# Patient Record
Sex: Male | Born: 1965 | Race: Black or African American | Hispanic: No | Marital: Single | Smoking: Current every day smoker
Health system: Southern US, Community
[De-identification: ages and names within clinical notes are randomized; demographics above are authoritative.]

## PROBLEM LIST (undated history)

## (undated) DIAGNOSIS — L309 Dermatitis, unspecified: Secondary | ICD-10-CM

## (undated) DIAGNOSIS — I10 Essential (primary) hypertension: Secondary | ICD-10-CM

## (undated) DIAGNOSIS — E785 Hyperlipidemia, unspecified: Secondary | ICD-10-CM

## (undated) DIAGNOSIS — E119 Type 2 diabetes mellitus without complications: Secondary | ICD-10-CM

## (undated) HISTORY — DX: Essential (primary) hypertension: I10

## (undated) HISTORY — DX: Hyperlipidemia, unspecified: E78.5

## (undated) HISTORY — DX: Dermatitis, unspecified: L30.9

## (undated) HISTORY — DX: Type 2 diabetes mellitus without complications: E11.9

---

## 1989-05-06 HISTORY — PX: OTHER SURGICAL HISTORY: SHX169

## 2006-07-12 ENCOUNTER — Emergency Department (HOSPITAL_COMMUNITY): Admission: EM | Admit: 2006-07-12 | Discharge: 2006-07-12 | Payer: Self-pay | Admitting: Emergency Medicine

## 2016-05-06 HISTORY — PX: CERVICAL SPINE SURGERY: SHX589

## 2020-09-12 ENCOUNTER — Other Ambulatory Visit (HOSPITAL_COMMUNITY): Payer: Self-pay

## 2020-09-12 ENCOUNTER — Encounter: Payer: Self-pay | Admitting: Internal Medicine

## 2020-09-12 ENCOUNTER — Ambulatory Visit (INDEPENDENT_AMBULATORY_CARE_PROVIDER_SITE_OTHER): Payer: Self-pay | Admitting: Internal Medicine

## 2020-09-12 ENCOUNTER — Other Ambulatory Visit: Payer: Self-pay

## 2020-09-12 VITALS — BP 138/82 | HR 80 | Temp 99.4°F | Ht 67.5 in | Wt 191.7 lb

## 2020-09-12 DIAGNOSIS — E785 Hyperlipidemia, unspecified: Secondary | ICD-10-CM

## 2020-09-12 DIAGNOSIS — L309 Dermatitis, unspecified: Secondary | ICD-10-CM

## 2020-09-12 DIAGNOSIS — E119 Type 2 diabetes mellitus without complications: Secondary | ICD-10-CM

## 2020-09-12 DIAGNOSIS — I10 Essential (primary) hypertension: Secondary | ICD-10-CM

## 2020-09-12 DIAGNOSIS — M545 Low back pain, unspecified: Secondary | ICD-10-CM

## 2020-09-12 DIAGNOSIS — F17200 Nicotine dependence, unspecified, uncomplicated: Secondary | ICD-10-CM

## 2020-09-12 DIAGNOSIS — G8929 Other chronic pain: Secondary | ICD-10-CM

## 2020-09-12 MED ORDER — METFORMIN HCL 850 MG PO TABS
850.0000 mg | ORAL_TABLET | Freq: Two times a day (BID) | ORAL | 11 refills | Status: DC
  Filled 2020-09-12: qty 60, 30d supply, fill #0
  Filled 2020-11-02: qty 60, 30d supply, fill #1

## 2020-09-12 MED ORDER — ATORVASTATIN CALCIUM 20 MG PO TABS
20.0000 mg | ORAL_TABLET | Freq: Every day | ORAL | 11 refills | Status: DC
  Filled 2020-09-12: qty 30, 30d supply, fill #0
  Filled 2020-11-02: qty 30, 30d supply, fill #1

## 2020-09-12 MED ORDER — AMLODIPINE BESYLATE 10 MG PO TABS
10.0000 mg | ORAL_TABLET | Freq: Every day | ORAL | 2 refills | Status: DC
  Filled 2020-09-12: qty 30, 30d supply, fill #0
  Filled 2020-11-02: qty 30, 30d supply, fill #1

## 2020-09-12 NOTE — Patient Instructions (Addendum)
Thank you, Jeffery Wheeler for allowing [place] to provide your care today. Today we discussed the following  1. Hyperlipidemia, unspecified hyperlipidemia type  - atorvastatin (LIPITOR) 20 MG tablet; Take 1 tablet (20 mg total) by mouth daily.  Dispense: 30 tablet; Refill: 11  2. Type 2 diabetes mellitus without complication, without long-term current use of insulin (HCC)  - metFORMIN (GLUCOPHAGE) 850 MG tablet; Take 1 tablet (850 mg total) by mouth 2 (two) times daily with a meal.  Dispense: 60 tablet; Refill: 11  3. Primary hypertension - amlodipine 10 mg  4. Back Pain  Back Exercises These exercises help to make your trunk and back strong. They also help to keep the lower back flexible. Doing these exercises can help to prevent back pain or lessen existing pain.  If you have back pain, try to do these exercises 2-3 times each day or as told by your doctor.  As you get better, do the exercises once each day. Repeat the exercises more often as told by your doctor.  To stop back pain from coming back, do the exercises once each day, or as told by your doctor. Exercises Single knee to chest Do these steps 3-5 times in a row for each leg: 1. Lie on your back on a firm bed or the floor with your legs stretched out. 2. Bring one knee to your chest. 3. Grab your knee or thigh with both hands and hold them it in place. 4. Pull on your knee until you feel a gentle stretch in your lower back or buttocks. 5. Keep doing the stretch for 10-30 seconds. 6. Slowly let go of your leg and straighten it. Pelvic tilt Do these steps 5-10 times in a row: 1. Lie on your back on a firm bed or the floor with your legs stretched out. 2. Bend your knees so they point up to the ceiling. Your feet should be flat on the floor. 3. Tighten your lower belly (abdomen) muscles to press your lower back against the floor. This will make your tailbone point up to the ceiling instead of pointing down to  your feet or the floor. 4. Stay in this position for 5-10 seconds while you gently tighten your muscles and breathe evenly.      Cat-cow Do these steps until your lower back bends more easily: 1. Get on your hands and knees on a firm surface. Keep your hands under your shoulders, and keep your knees under your hips. You may put padding under your knees. 2. Let your head hang down toward your chest. Tighten (contract) the muscles in your belly. Point your tailbone toward the floor so your lower back becomes rounded like the back of a cat. 3. Stay in this position for 5 seconds. 4. Slowly lift your head. Let the muscles of your belly relax. Point your tailbone up toward the ceiling so your back forms a sagging arch like the back of a cow. 5. Stay in this position for 5 seconds.    Press-ups Do these steps 5-10 times in a row: 1. Lie on your belly (face-down) on the floor. 2. Place your hands near your head, about shoulder-width apart. 3. While you keep your back relaxed and keep your hips on the floor, slowly straighten your arms to raise the top half of your body and lift your shoulders. Do not use your back muscles. You may change where you place your hands in order to make  yourself more comfortable. 4. Stay in this position for 5 seconds. 5. Slowly return to lying flat on the floor.    Bridges Do these steps 10 times in a row: 1. Lie on your back on a firm surface. 2. Bend your knees so they point up to the ceiling. Your feet should be flat on the floor. Your arms should be flat at your sides, next to your body. 3. Tighten your butt muscles and lift your butt off the floor until your waist is almost as high as your knees. If you do not feel the muscles working in your butt and the back of your thighs, slide your feet 1-2 inches farther away from your butt. 4. Stay in this position for 3-5 seconds. 5. Slowly lower your butt to the floor, and let your butt muscles relax. If this exercise  is too easy, try doing it with your arms crossed over your chest.    Belly crunches Do these steps 5-10 times in a row: 1. Lie on your back on a firm bed or the floor with your legs stretched out. 2. Bend your knees so they point up to the ceiling. Your feet should be flat on the floor. 3. Cross your arms over your chest. 4. Tip your chin a little bit toward your chest but do not bend your neck. 5. Tighten your belly muscles and slowly raise your chest just enough to lift your shoulder blades a tiny bit off of the floor. Avoid raising your body higher than that, because it can put too much stress on your low back. 6. Slowly lower your chest and your head to the floor. Back lifts Do these steps 5-10 times in a row: 1. Lie on your belly (face-down) with your arms at your sides, and rest your forehead on the floor. 2. Tighten the muscles in your legs and your butt. 3. Slowly lift your chest off of the floor while you keep your hips on the floor. Keep the back of your head in line with the curve in your back. Look at the floor while you do this. 4. Stay in this position for 3-5 seconds. 5. Slowly lower your chest and your face to the floor. Contact a doctor if:  Your back pain gets a lot worse when you do an exercise.  Your back pain does not get better 2 hours after you exercise. If you have any of these problems, stop doing the exercises. Do not do them again unless your doctor says it is okay. Get help right away if:  You have sudden, very bad back pain. If this happens, stop doing the exercises. Do not do them again unless your doctor says it is okay.  5.Eczema Atopic Dermatitis Atopic dermatitis is a skin disorder that causes inflammation of the skin. It is marked by a red rash and itchy, dry, scaly skin. It is the most common type of eczema. Eczema is a group of skin conditions that cause the skin to become rough and swollen. This condition is generally worse during the cooler winter  months and often improves during the warm summer months. Atopic dermatitis usually starts showing signs in infancy and can last through adulthood. This condition cannot be passed from one person to another (is not contagious). Atopic dermatitis may not always be present, but when it is, it is called a flare-up. What are the causes? The exact cause of this condition is not known. Flare-ups may be triggered by:  Coming  in contact with something that you are sensitive or allergic to (allergen).  Stress.  Certain foods.  Extremely hot or cold weather.  Harsh chemicals and soaps.  Dry air.  Chlorine. What increases the risk? This condition is more likely to develop in people who have a personal or family history of:  Eczema.  Allergies.  Asthma.  Hay fever. What are the signs or symptoms? Symptoms of this condition include:  Dry, scaly skin.  Red, itchy rash.  Itchiness, which can be severe. This may occur before the skin rash. This can make sleeping difficult.  Skin thickening and cracking that can occur over time.    How is this diagnosed? This condition is diagnosed based on:  Your symptoms.  Your medical history.  A physical exam. How is this treated? There is no cure for this condition, but symptoms can usually be controlled. Treatment focuses on:  Controlling the itchiness and scratching. You may be given medicines, such as antihistamines or steroid creams.  Limiting exposure to allergens.  Recognizing situations that cause stress and developing a plan to manage stress. If your atopic dermatitis does not get better with medicines, or if it is all over your body (widespread), a treatment using a specific type of light (phototherapy) may be used. Follow these instructions at home: Skin care  Keep your skin well moisturized. Doing this seals in moisture and helps to prevent dryness. ? Use unscented lotions that have petroleum in them. ? Avoid lotions that  contain alcohol or water. They can dry the skin.  Keep baths or showers short (less than 5 minutes) in warm water. Do not use hot water. ? Use mild, unscented cleansers for bathing. Avoid soap and bubble bath. ? Apply a moisturizer to your skin right after a bath or shower.  Do not apply anything to your skin without checking with your health care provider.    General instructions  Take or apply over-the-counter and prescription medicines only as told by your health care provider.  Dress in clothes made of cotton or cotton blends. Dress lightly because heat increases itchiness.  When washing your clothes, rinse your clothes twice so all of the soap is removed.  Avoid any triggers that can cause a flare-up.  Keep your fingernails cut short.  Avoid scratching. Scratching makes the rash and itchiness worse. A break in the skin from scratching could result in a skin infection (impetigo).  Do not be around people who have cold sores or fever blisters. If you get the infection, it may cause your atopic dermatitis to worsen.  Keep all follow-up visits. This is important. Contact a health care provider if:  Your itchiness interferes with sleep.  Your rash gets worse or is not better within one week of starting treatment.  You have a fever.  You have a rash flare-up after having contact with someone who has cold sores or fever blisters. Get help right away if:  You develop pus or soft yellow scabs in the rash area. Summary  Atopic dermatitis causes a red rash and itchy, dry, scaly skin.  Treatment focuses on controlling the itchiness and scratching, limiting exposure to things that you are sensitive or allergic to (allergens), recognizing situations that cause stress, and developing a plan to manage stress.  Keep your skin well moisturized.  Keep baths or showers shorter than 5 minutes and use warm water. Do not use hot water.

## 2020-09-12 NOTE — Progress Notes (Signed)
   CC: Establishing care with clinic. Hyperlipidemia, Type 2 diabetes mellitus, primary hypertension, chronic back pain, and Eczema   HPI:[name] is a 55 y.o. male who presents for evaluation of HLD, T2DM, HTN, Chronic back pain, and Eczema. Please see individual problem based A/P for details.  Family History  Problem Relation Age of Onset  . Diabetes type II Sister   . Diabetes type II Paternal Grandmother   . Diabetes type II Maternal Aunt    No Known Allergies  Past Medical History:  Diagnosis Date  . Eczema   . HLD (hyperlipidemia)   . HTN (hypertension)   . Type 2 diabetes mellitus (HCC)    Patient currently lives at a half way house. He was released from a 15 year prison sentence in March. He is adjusting to his new life fairly well. He has family near. He is not married.  Social History   Tobacco Use  . Smoking status: Current Every Day Smoker    Packs/day: 0.50  Substance Use Topics  . Alcohol use: Not Currently  . Drug use: Not Currently   Family History  Problem Relation Age of Onset  . Diabetes type II Sister   . Diabetes type II Paternal Grandmother   . Diabetes type II Maternal Aunt     No past medical history on file. Review of Systems:   Review of Systems  Constitutional: Negative for chills and fever.  HENT: Negative for hearing loss and sore throat.   Eyes: Negative for pain and redness.  Respiratory: Negative for cough and shortness of breath.   Cardiovascular: Negative for chest pain and leg swelling.  Gastrointestinal: Negative for blood in stool and melena.  Genitourinary: Negative for dysuria and urgency.  Musculoskeletal: Positive for back pain. Negative for falls.  Skin: Positive for itching and rash.  Neurological: Negative for dizziness and headaches.  Endo/Heme/Allergies: Negative for environmental allergies and polydipsia.  Psychiatric/Behavioral: Negative for depression and substance abuse.       Physical  Exam: Vitals:   09/12/20 1413  BP: 138/82  Pulse: 80  Temp: 99.4 F (37.4 C)  TempSrc: Oral  SpO2: 99%  Weight: 191 lb 11.2 oz (87 kg)  Height: 5' 7.5" (1.715 m)   General: middle age man with shaved head, well kept, NAD HE: [place]/AT , Conjunctiva nl , antiicteric sclerae,  ENT: moist mucous membranes, no exudate or erythema Cardiovascular: Normal rate, regular rhythm.  No murmurs, rubs, or gallops Pulmonary : Equal breath sounds, No wheezes, rales, or rhonchi Abdominal: soft, nontender,  bowel sounds present Skin: Rash on hands , feet and left lower leg. Red/brown pruritic patchy dry skin   Ext: No edema in lower extremities, no tenderness to palpation of lower extremities.  Psych: nl mood , nl affect   Assessment & Plan:   See Encounters Tab for problem based charting.  Patient discussed with Dr. Antony Contras

## 2020-09-13 ENCOUNTER — Encounter: Payer: Self-pay | Admitting: Internal Medicine

## 2020-09-13 DIAGNOSIS — M549 Dorsalgia, unspecified: Secondary | ICD-10-CM | POA: Insufficient documentation

## 2020-09-13 DIAGNOSIS — L309 Dermatitis, unspecified: Secondary | ICD-10-CM | POA: Insufficient documentation

## 2020-09-13 DIAGNOSIS — E785 Hyperlipidemia, unspecified: Secondary | ICD-10-CM | POA: Insufficient documentation

## 2020-09-13 DIAGNOSIS — I10 Essential (primary) hypertension: Secondary | ICD-10-CM | POA: Insufficient documentation

## 2020-09-13 DIAGNOSIS — E119 Type 2 diabetes mellitus without complications: Secondary | ICD-10-CM | POA: Insufficient documentation

## 2020-09-13 DIAGNOSIS — F17200 Nicotine dependence, unspecified, uncomplicated: Secondary | ICD-10-CM | POA: Insufficient documentation

## 2020-09-13 NOTE — Assessment & Plan Note (Addendum)
BP: 138/82   HPI: Patient has been out of all medications for 10 days. His presents medication he was on which include lisinopril 20 mg, HCTZ 25 mg, and Coreg 12.5 BID. Given his BP is only mildly elevated we discussed starting back only one medication. His resources are limited and he is applying for financial assistance.   Assessment: Hypertension is uncontrolled today off of all medications. Plan: - Starting Amlodipine 10 mg which does not require lab monitoring. Patient will need baseline labs at next visit.  - amLODipine (NORVASC) 10 MG tablet; Take 1 tablet (10 mg total) by mouth daily.  Dispense: 30 tablet; Refill: 2

## 2020-09-13 NOTE — Assessment & Plan Note (Addendum)
HPI: Reports a history of lower back pain. Also has a history of cervical spine surgery. 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  Assessment/Plan: By description patient has history of lumbar strain. - Patient given exercises and stretches to perform for prevention.

## 2020-09-13 NOTE — Assessment & Plan Note (Signed)
Hx of HLD. Patient has been on lovastatin 40 mg. Will switch to Lipitor as it is on IM program formulary.   Assessment: Hyperlipidemia, unspecified hyperlipidemia type Plan: Continue moderate intensity statin, patient will need lab work at next visit. He is applying for financial assistance to afford cost of labs. Will defer today and continue moderated intensity statin. He statin intolerance in the past.  - atorvastatin (LIPITOR) 20 MG tablet; Take 1 tablet (20 mg total) by mouth daily.  Dispense: 30 tablet; Refill: 11

## 2020-09-13 NOTE — Assessment & Plan Note (Signed)
  Patient has been treated for Eczema in the past .Reports being given a cream which seem to help, but unaware of the specific cream. Has Eczema involving hands , left leg, forearm,  and some place on his feet.  Assessment: Eczema Plan: - Given instructions on skin care. Recommended over the counter hydrocortisone cream if skin care alone does not help manage.

## 2020-09-13 NOTE — Assessment & Plan Note (Signed)
Patient recently started smoking again . He reports smoking when he was younger and he quit when incarcerated. He has not smoked tobacco in the last 15 years. Not interested in quitting at this time.

## 2020-09-13 NOTE — Assessment & Plan Note (Signed)
He reports he was told diabetes is controlled on Metformin only, but does not know his recent Hemoglobin A1c. Will defer lab work today, but educated patient on diabetes and complications from uncontrolled diabetes.   Assessment : Type 2 diabetes mellitus without complication, without long-term current use of insulin (HCC). He is aware I can not make further assessment without lab work. We talked about monitoring at future visits with Hemoglobin A1c once financial assistance is approved.  Plan: - metFORMIN (GLUCOPHAGE) 850 MG tablet; Take 1 tablet (850 mg total) by mouth 2 (two) times daily with a meal.  Dispense: 60 tablet; Refill: 11 - Lab work at next visit

## 2020-09-15 NOTE — Progress Notes (Signed)
[facility] Medicine Clinic Attending  Case discussed with Dr. Steen  At the time of the visit.  We reviewed the resident's history and exam and pertinent patient test results.  I agree with the assessment, diagnosis, and plan of care documented in the resident's note.  

## 2020-09-26 ENCOUNTER — Ambulatory Visit: Payer: Self-pay

## 2020-09-26 ENCOUNTER — Other Ambulatory Visit: Payer: Self-pay

## 2020-11-02 ENCOUNTER — Other Ambulatory Visit (HOSPITAL_COMMUNITY): Payer: Self-pay

## 2020-11-20 ENCOUNTER — Other Ambulatory Visit (HOSPITAL_COMMUNITY): Payer: Self-pay

## 2020-11-20 ENCOUNTER — Ambulatory Visit: Payer: Self-pay | Admitting: Physician Assistant

## 2020-11-20 ENCOUNTER — Other Ambulatory Visit: Payer: Self-pay

## 2020-11-20 VITALS — BP 145/82 | HR 78 | Temp 98.6°F | Resp 18 | Ht 69.0 in | Wt 190.0 lb

## 2020-11-20 DIAGNOSIS — Z1159 Encounter for screening for other viral diseases: Secondary | ICD-10-CM

## 2020-11-20 DIAGNOSIS — F5104 Psychophysiologic insomnia: Secondary | ICD-10-CM

## 2020-11-20 DIAGNOSIS — I10 Essential (primary) hypertension: Secondary | ICD-10-CM

## 2020-11-20 DIAGNOSIS — Z125 Encounter for screening for malignant neoplasm of prostate: Secondary | ICD-10-CM

## 2020-11-20 DIAGNOSIS — R351 Nocturia: Secondary | ICD-10-CM | POA: Insufficient documentation

## 2020-11-20 DIAGNOSIS — E785 Hyperlipidemia, unspecified: Secondary | ICD-10-CM

## 2020-11-20 DIAGNOSIS — R350 Frequency of micturition: Secondary | ICD-10-CM

## 2020-11-20 DIAGNOSIS — Z114 Encounter for screening for human immunodeficiency virus [HIV]: Secondary | ICD-10-CM

## 2020-11-20 DIAGNOSIS — E119 Type 2 diabetes mellitus without complications: Secondary | ICD-10-CM

## 2020-11-20 DIAGNOSIS — F1010 Alcohol abuse, uncomplicated: Secondary | ICD-10-CM

## 2020-11-20 DIAGNOSIS — F17210 Nicotine dependence, cigarettes, uncomplicated: Secondary | ICD-10-CM

## 2020-11-20 DIAGNOSIS — F411 Generalized anxiety disorder: Secondary | ICD-10-CM | POA: Insufficient documentation

## 2020-11-20 DIAGNOSIS — F1910 Other psychoactive substance abuse, uncomplicated: Secondary | ICD-10-CM

## 2020-11-20 DIAGNOSIS — R339 Retention of urine, unspecified: Secondary | ICD-10-CM | POA: Insufficient documentation

## 2020-11-20 DIAGNOSIS — F17200 Nicotine dependence, unspecified, uncomplicated: Secondary | ICD-10-CM

## 2020-11-20 LAB — POCT URINALYSIS DIP (CLINITEK)
Bilirubin, UA: NEGATIVE
Blood, UA: NEGATIVE
Glucose, UA: >=1000 mg/dL — AB
Ketones, POC UA: NEGATIVE mg/dL
Leukocytes, UA: NEGATIVE
Nitrite, UA: NEGATIVE
POC PROTEIN,UA: NEGATIVE
Spec Grav, UA: 1.015 (ref 1.010–1.025)
Urobilinogen, UA: 1 E.U./dL
pH, UA: 6 (ref 5.0–8.0)

## 2020-11-20 LAB — POCT GLYCOSYLATED HEMOGLOBIN (HGB A1C): Hemoglobin A1C: 6.5 % — AB (ref 4.0–5.6)

## 2020-11-20 LAB — GLUCOSE, POCT (MANUAL RESULT ENTRY): POC Glucose: 156 mg/dl — AB (ref 70–99)

## 2020-11-20 MED ORDER — ATORVASTATIN CALCIUM 20 MG PO TABS: 20.0000 mg | ORAL_TABLET | Freq: Every day | ORAL | 1 refills | Status: DC

## 2020-11-20 MED ORDER — TRAZODONE HCL 50 MG PO TABS: 25.0000 mg | ORAL_TABLET | Freq: Every evening | ORAL | 3 refills | Status: DC | PRN

## 2020-11-20 MED ORDER — METFORMIN HCL 850 MG PO TABS: 850.0000 mg | ORAL_TABLET | Freq: Two times a day (BID) | ORAL | 1 refills | Status: DC

## 2020-11-20 MED ORDER — LISINOPRIL 5 MG PO TABS: 5.0000 mg | ORAL_TABLET | Freq: Every day | ORAL | 1 refills | Status: DC

## 2020-11-20 MED ORDER — AMLODIPINE BESYLATE 10 MG PO TABS: 10.0000 mg | ORAL_TABLET | Freq: Every day | ORAL | 1 refills | Status: DC

## 2020-11-20 MED ORDER — HYDROXYZINE HCL 10 MG PO TABS
10.0000 mg | ORAL_TABLET | Freq: Three times a day (TID) | ORAL | 0 refills | Status: DC | PRN
Start: 2020-11-20 — End: 2021-12-17

## 2020-11-20 NOTE — Progress Notes (Signed)
Patient has eaten and taken medication today. Patient denies pain at this time. Patient reports increase in urination since the 1st of July. Patient A1C: 6.5 CBG: 156 UA: NEG for Infection

## 2020-11-20 NOTE — Progress Notes (Signed)
New Patient Office Visit  Subjective:  Patient ID: Jeffery Wheeler, male    DOB: [birth year]  Age: 55 y.o. MRN: [id number]  CC:  Chief Complaint  Patient presents with   Hypertension   Diabetes     HPI Mikie Misner orts that he has been having increased urination since the beginning of July.  Reports that he is getting up in the middle the night to use the bathroom up to 5 times at night.  Reports that he is having some feelings of incomplete emptying.  Denies dysuria.  States that he is currently being treated for substance abuse at [facility] residential treatment center, states that he plans on staying there until July 29 and is unsure of his plans after that.  Reports that he was previously incarcerated for 15 years.  He states that during that time his blood glucose levels were well controlled.  Reports that he continues to take his metformin twice a day, has been having blood glucose levels checked twice a day with readings within normal limits in the morning.  Reports that he has been having elevated blood pressure readings, has been taking amlodipine 10 mg.  Reports blood pressure readings as similar to today's reading.  Reports that he has been having difficulty sleeping, has been using melatonin and Benadryl without relief.  Reports he has a hard time falling asleep, and going back to sleep after having to get up to urinate.  Endorses racing thoughts  States that he has been having elevated anxiety, states that he will feel "things crawling on his skin".  Denies previously being treated for anxiety.  Denies anxiety attacks.  Reports that he had irritation in his left ear approximately 1 week ago, states that he felt a scab, states that he put Neosporin on it with resolution.       Past Medical History:  Diagnosis Date   Eczema    HLD (hyperlipidemia)    HTN (hypertension)    Type 2 diabetes mellitus (HCC)     Past Surgical History:  Procedure Laterality Date    CERVICAL SPINE SURGERY  2018   right shoulder  1991    Family History  Problem Relation Age of Onset   Diabetes type II Sister    Diabetes type II Paternal Grandmother    Diabetes type II Maternal Aunt     Social History   Socioeconomic History   Marital status: Single    Spouse name: Not on file   Number of children: Not on file   Years of education: Not on file   Highest education level: Not on file  Occupational History   Not on file  Tobacco Use   Smoking status: Every Day    Packs/day: 0.50    Types: Cigarettes   Smokeless tobacco: Never  Substance and Sexual Activity   Alcohol use: Not Currently   Drug use: Not Currently   Sexual activity: Yes  Other Topics Concern   Not on file  Social History Narrative   Not on file   Social Determinants of Health   Financial Resource Strain: Not on file  Food Insecurity: Not on file  Transportation Needs: Not on file  Physical Activity: Not on file  Stress: Not on file  Social Connections: Not on file  Intimate Partner Violence: Not on file    ROS Review of Systems  Constitutional: Negative.   HENT:  Negative for ear discharge and ear pain.   Eyes: Negative.  Respiratory:  Negative for shortness of breath.   Cardiovascular:  Negative for chest pain.  Gastrointestinal: Negative.   Endocrine: Negative.   Genitourinary:  Positive for frequency. Negative for dysuria, hematuria and penile discharge.  Musculoskeletal: Negative.   Skin: Negative.   Allergic/Immunologic: Negative.   Neurological: Negative.   Hematological: Negative.   Psychiatric/Behavioral:  Positive for sleep disturbance. Negative for dysphoric mood, self-injury and suicidal ideas. The patient is nervous/anxious.    Objective:   Today's Vitals: BP (!) 145/82 (BP Location: Left Arm, Patient Position: Sitting, Cuff Size: Normal)   Pulse 78   Temp 98.6 F (37 C) (Oral)   Resp 18   Ht 5\' 9"  (1.753 m)   Wt 190 lb (86.2 kg)   SpO2 99%   BMI 28.06  kg/m   Physical Exam Vitals and nursing note reviewed.  Constitutional:      Appearance: Normal appearance.  HENT:     Head: Normocephalic and atraumatic.     Right Ear: Tympanic membrane, ear canal and external ear normal.     Left Ear: Tympanic membrane, ear canal and external ear normal.     Nose: Nose normal.     Mouth/Throat:     Mouth: Mucous membranes are moist.     Pharynx: Oropharynx is clear.  Eyes:     Extraocular Movements: Extraocular movements intact.     Conjunctiva/sclera: Conjunctivae normal.     Pupils: Pupils are equal, round, and reactive to light.  Cardiovascular:     Rate and Rhythm: Normal rate and regular rhythm.     Pulses: Normal pulses.     Heart sounds: Normal heart sounds.  Pulmonary:     Effort: Pulmonary effort is normal.     Breath sounds: Normal breath sounds.  Musculoskeletal:        General: Normal range of motion.     Cervical back: Normal range of motion and neck supple.  Skin:    General: Skin is warm and dry.  Neurological:     General: No focal deficit present.     Mental Status: He is alert. Mental status is at baseline. He is disoriented.  Psychiatric:        Mood and Affect: Mood normal.        Behavior: Behavior normal.        Thought Content: Thought content normal.        Judgment: Judgment normal.    Assessment & Plan:   Problem List Items Addressed This Visit       Cardiovascular and Mediastinum   HTN (hypertension)   Relevant Medications   amLODipine (NORVASC) 10 MG tablet   atorvastatin (LIPITOR) 20 MG tablet   lisinopril (ZESTRIL) 5 MG tablet     Endocrine   Type 2 diabetes mellitus (HCC) - Primary   Relevant Medications   atorvastatin (LIPITOR) 20 MG tablet   metFORMIN (GLUCOPHAGE) 850 MG tablet   lisinopril (ZESTRIL) 5 MG tablet   Other Relevant Orders   CBC with Differential/Platelet (Completed)   Comp. Metabolic Panel (12) (Completed)   TSH (Completed)   Microalbumin / creatinine urine ratio    Vitamin D, 25-hydroxy (Completed)   HgB A1c (Completed)   Glucose (CBG) (Completed)     Other   HLD (hyperlipidemia)   Relevant Medications   amLODipine (NORVASC) 10 MG tablet   atorvastatin (LIPITOR) 20 MG tablet   lisinopril (ZESTRIL) 5 MG tablet   Other Relevant Orders   Lipid panel   Tobacco use disorder  Increased urinary frequency   Relevant Orders   POCT URINALYSIS DIP (CLINITEK) (Completed)   Nocturia more than twice per night   Incomplete emptying of bladder   Psychophysiological insomnia   Generalized anxiety disorder   Relevant Medications   traZODone (DESYREL) 50 MG tablet   hydrOXYzine (ATARAX/VISTARIL) 10 MG tablet   Alcohol abuse   Substance abuse (HCC)   Other Visit Diagnoses     Screening PSA (prostate specific antigen)       Relevant Orders   PSA, total and free (Completed)   Screening for HIV (human immunodeficiency virus)       Relevant Orders   HIV antibody (with reflex) (Completed)   Encounter for HCV screening test for low risk patient       Relevant Orders   HCV Ab w Reflex to Quant PCR (Completed)       Outpatient Encounter Medications as of 11/20/2020  Medication Sig   hydrOXYzine (ATARAX/VISTARIL) 10 MG tablet Take 1 tablet (10 mg total) by mouth 3 (three) times daily as needed for anxiety.   lisinopril (ZESTRIL) 5 MG tablet Take 1 tablet (5 mg total) by mouth daily.   traZODone (DESYREL) 50 MG tablet Take 0.5-1 tablets (25-50 mg total) by mouth at bedtime as needed for sleep.   [DISCONTINUED] amLODipine (NORVASC) 10 MG tablet Take 1 tablet (10 mg total) by mouth daily.   [DISCONTINUED] atorvastatin (LIPITOR) 20 MG tablet Take 1 tablet (20 mg total) by mouth daily.   [DISCONTINUED] metFORMIN (GLUCOPHAGE) 850 MG tablet Take 1 tablet (850 mg total) by mouth 2 (two) times daily with a meal.   amLODipine (NORVASC) 10 MG tablet Take 1 tablet (10 mg total) by mouth daily.   atorvastatin (LIPITOR) 20 MG tablet Take 1 tablet (20 mg total) by mouth  daily.   metFORMIN (GLUCOPHAGE) 850 MG tablet Take 1 tablet (850 mg total) by mouth 2 (two) times daily with a meal.   No facility-administered encounter medications on file as of 11/20/2020.   1. Type 2 diabetes mellitus without complication, without long-term current use of insulin (HCC) A1c 6.5.  Encourage patient to continue checking blood glucose levels at home, keep a written log and have available for all office visits.  Continue current regimen.  Patient education given on low sugar diet.   - Microalbumin / creatinine urine ratio - HgB A1c - Glucose (CBG) - metFORMIN (GLUCOPHAGE) 850 MG tablet; Take 1 tablet (850 mg total) by mouth 2 (two) times daily with a meal.  Dispense: 60 tablet; Refill: 1 - Vitamin D, 25-hydroxy - TSH - Comp. Metabolic Panel (12) - CBC with Differential/Platelet  2. Primary hypertension Continue amlodipine, trial lisinopril 5 mg.  Patient encouraged to check blood pressure on a daily basis, keep a written log and have available for all office visits.  Patient to return to mobile unit in 2 weeks for follow-up and complete lipid panel.  Patient encouraged to follow a low-sodium diet. - amLODipine (NORVASC) 10 MG tablet; Take 1 tablet (10 mg total) by mouth daily.  Dispense: 30 tablet; Refill: 1 - lisinopril (ZESTRIL) 5 MG tablet; Take 1 tablet (5 mg total) by mouth daily.  Dispense: 30 tablet; Refill: 1  3. Hyperlipidemia, unspecified hyperlipidemia type Continue current regimen - Lipid panel; Future - atorvastatin (LIPITOR) 20 MG tablet; Take 1 tablet (20 mg total) by mouth daily.  Dispense: 30 tablet; Refill: 1  4. Increased urinary frequency Negative for urinary tract infection, encouraged patient to limit evening liquids -  POCT URINALYSIS DIP (CLINITEK)  5. Incomplete emptying of bladder   6. Nocturia more than twice per night   7. Psychophysiological insomnia   8. Generalized anxiety disorder Trial trazodone for insomnia, hydroxyzine for  anxiety.  Patient education given on good sleep hygiene - traZODone (DESYREL) 50 MG tablet; Take 0.5-1 tablets (25-50 mg total) by mouth at bedtime as needed for sleep.  Dispense: 30 tablet; Refill: 3 - hydrOXYzine (ATARAX/VISTARIL) 10 MG tablet; Take 1 tablet (10 mg total) by mouth 3 (three) times daily as needed for anxiety.  Dispense: 60 tablet; Refill: 0  9. Alcohol abuse Patient is currently in residential treatment program at [facility]  10. Substance abuse (HCC)   11. Tobacco use disorder   12. Screening PSA (prostate specific antigen)  -  - PSA, total and free  13. Screening for HIV (human immunodeficiency virus)  - - HIV antibody (with reflex)  14. Encounter for HCV screening test for low risk patient   - HCV Ab w Reflex to Quant PCR   I have reviewed the patient's medical history (PMH, PSH, Social History, Family History, Medications, and allergies) , and have been updated if relevant. I spent 32 minutes reviewing chart and  face to face time with patient.    Follow-up: Return in about 2 weeks (around 12/04/2020) for Fasting  labs.   Kasandra Knudsen Mayers, PA-C

## 2020-11-20 NOTE — Patient Instructions (Signed)
For your diabetes, you will continue taking metformin twice a day, you can reduce your blood glucose checks to 2 times a week.  Your A1c was 6.5, your diabetes is very well controlled.  I do encourage you to limit your sugar intake.  For your elevated blood pressure, you are running continue amlodipine 10 mg, we are going to add lisinopril 5 mg once a day to try and get better control.  I encourage you to have your blood pressure checked on a daily basis, keep a written log and have available for all office visits to I also encourage a low-sodium diet.  For your elevated cholesterol, you will continue atorvastatin 20 mg  For your insomnia, you were going to start trazodone, you can use 1/2-1 full tablet at bedtime as needed.  To help you with your anxiety, you can use hydroxyzine 10 mg every 8 hours as needed.  We will call you with today's lab results.  Please return to the mobile unit in 2 weeks, you will complete your fasting lab at that time as well as a follow-up on your blood pressure and sleep.  Roney Jaffe, PA-C Physician Assistant [facility] [email or web address]   Overactive Bladder, Adult  Overactive bladder is a condition in which a person has a sudden and frequent need to urinate. A person might also leak urine if he or she cannot get to the bathroom fast enough (urinary incontinence). Sometimes, symptoms can interfere with work or social activities. What are the causes? Overactive bladder is associated with poor nerve signals between your bladder and your brain. Your bladder may get the signal to empty before it is full. You may also have very sensitive muscles that make your bladder squeeze too soon. This condition may also be caused by other factors, such as: Medical conditions: Urinary tract infection. Infection of nearby tissues. Prostate enlargement. Bladder stones, inflammation, or tumors. Diabetes. Muscle or  nerve weakness, especially from these conditions: A spinal cord injury. Stroke. Multiple sclerosis. Parkinson's disease. Other causes: Surgery on the uterus or urethra. Drinking too much caffeine or alcohol. Certain medicines, especially those that eliminate extra fluid in the body (diuretics). Constipation. What increases the risk? You may be at greater risk for overactive bladder if you: Are an older adult. Smoke. Are going through menopause. Have prostate problems. Have a neurological disease, such as stroke, dementia, Parkinson's disease, or multiple sclerosis (MS). Eat or drink alcohol, spicy food, caffeine, and other things that irritate the bladder. Are overweight or obese. What are the signs or symptoms? Symptoms of this condition include a sudden, strong urge to urinate. Other symptoms include: Leaking urine. Urinating 8 or more times a day. Waking up to urinate 2 or more times overnight. How is this diagnosed? This condition may be diagnosed based on: Your symptoms and medical history. A physical exam. Blood or urine tests to check for possible causes, such as infection. You may also need to see a health care provider who specializes in urinarytract problems. This is called a urologist. How is this treated? Treatment for overactive bladder depends on the cause of your condition and whether it is mild or severe. Treatment may include: Bladder training, such as: Learning to control the urge to urinate by following a schedule to urinate at regular intervals. Doing Kegel exercises to strengthen the pelvic floor muscles that support your bladder. Special devices, such as: Biofeedback. This uses sensors to help you become aware of your body's signals. Electrical stimulation. This  uses electrodes placed inside the body (implanted) or outside the body. These electrodes send gentle pulses of electricity to strengthen the nerves or muscles that control the bladder. Women may use  a plastic device, called a pessary, that fits into the vagina and supports the bladder. Medicines, such as: Antibiotics to treat bladder infection. Antispasmodics to stop the bladder from releasing urine at the wrong time. Tricyclic antidepressants to relax bladder muscles. Injections of botulinum toxin type A directly into the bladder tissue to relax bladder muscles. Surgery, such as: A device may be implanted to help manage the nerve signals that control urination. An electrode may be implanted to stimulate electrical signals in the bladder. A procedure may be done to change the shape of the bladder. This is done only in very severe cases. Follow these instructions at home: Eating and drinking  Make diet or lifestyle changes recommended by your health care provider. These may include: Drinking fluids throughout the day and not only with meals. Cutting down on caffeine or alcohol. Eating a healthy and balanced diet to prevent constipation. This may include: Choosing foods that are high in fiber, such as beans, whole grains, and fresh fruits and vegetables. Limiting foods that are high in fat and processed sugars, such as fried and sweet foods.  Lifestyle  Lose weight if needed. Do not use any products that contain nicotine or tobacco. These include cigarettes, chewing tobacco, and vaping devices, such as e-cigarettes. If you need help quitting, ask your health care provider.  General instructions Take over-the-counter and prescription medicines only as told by your health care provider. If you were prescribed an antibiotic medicine, take it as told by your health care provider. Do not stop taking the antibiotic even if you start to feel better. Use any implants or pessary as told by your health care provider. If needed, wear pads to absorb urine leakage. Keep a log to track how much and when you drink, and when you need to urinate. This will help your health care provider monitor your  condition. Keep all follow-up visits. This is important. Contact a health care provider if: You have a fever or chills. Your symptoms do not get better with treatment. Your pain and discomfort get worse. You have more frequent urges to urinate. Get help right away if: You are not able to control your bladder. Summary Overactive bladder refers to a condition in which a person has a sudden and frequent need to urinate. Several conditions may lead to an overactive bladder. Treatment for overactive bladder depends on the cause and severity of your condition. Making lifestyle changes, doing Kegel exercises, keeping a log, and taking medicines can help with this condition. This information is not intended to replace advice given to you by your health care provider. Make sure you discuss any questions you have with your healthcare provider. Document Revised: 01/10/2020 Document Reviewed: 01/10/2020 Elsevier Patient Education  2022 [employer].

## 2020-11-21 LAB — CBC WITH DIFFERENTIAL/PLATELET
Basophils Absolute: 0.1 10*3/uL (ref 0.0–0.2)
Basos: 1 %
EOS (ABSOLUTE): 0.8 10*3/uL — ABNORMAL HIGH (ref 0.0–0.4)
Eos: 5 %
Hematocrit: 44.7 % (ref 37.5–51.0)
Hemoglobin: 14.5 g/dL (ref 13.0–17.7)
Immature Grans (Abs): 0 10*3/uL (ref 0.0–0.1)
Immature Granulocytes: 0 %
Lymphocytes Absolute: 2.2 10*3/uL (ref 0.7–3.1)
Lymphs: 14 %
MCH: 28.3 pg (ref 26.6–33.0)
MCHC: 32.4 g/dL (ref 31.5–35.7)
MCV: 87 fL (ref 79–97)
Monocytes Absolute: 1.1 10*3/uL — ABNORMAL HIGH (ref 0.1–0.9)
Monocytes: 7 %
Neutrophils Absolute: 11.5 10*3/uL — ABNORMAL HIGH (ref 1.4–7.0)
Neutrophils: 73 %
Platelets: 304 10*3/uL (ref 150–450)
RBC: 5.13 x10E6/uL (ref 4.14–5.80)
RDW: 14.6 % (ref 11.6–15.4)
WBC: 15.8 10*3/uL — ABNORMAL HIGH (ref 3.4–10.8)

## 2020-11-21 LAB — HCV AB W REFLEX TO QUANT PCR: HCV Ab: 0.4 s/co ratio (ref 0.0–0.9)

## 2020-11-21 LAB — VITAMIN D 25 HYDROXY (VIT D DEFICIENCY, FRACTURES): Vit D, 25-Hydroxy: 23.7 ng/mL — ABNORMAL LOW (ref 30.0–100.0)

## 2020-11-21 LAB — TSH: TSH: 1.53 u[IU]/mL (ref 0.450–4.500)

## 2020-11-21 LAB — MICROALBUMIN / CREATININE URINE RATIO
Creatinine, Urine: 98.6 mg/dL
Microalb/Creat Ratio: 4 mg/g creat (ref 0–29)
Microalbumin, Urine: 3.5 ug/mL

## 2020-11-21 LAB — COMP. METABOLIC PANEL (12)
AST: 11 IU/L (ref 0–40)
Albumin/Globulin Ratio: 1.4 (ref 1.2–2.2)
Albumin: 4.2 g/dL (ref 3.8–4.9)
Alkaline Phosphatase: 94 IU/L (ref 44–121)
BUN/Creatinine Ratio: 13 (ref 9–20)
BUN: 12 mg/dL (ref 6–24)
Bilirubin Total: 0.3 mg/dL (ref 0.0–1.2)
Calcium: 9.5 mg/dL (ref 8.7–10.2)
Chloride: 102 mmol/L (ref 96–106)
Creatinine, Ser: 0.93 mg/dL (ref 0.76–1.27)
Globulin, Total: 2.9 g/dL (ref 1.5–4.5)
Glucose: 115 mg/dL — ABNORMAL HIGH (ref 65–99)
Potassium: 4.3 mmol/L (ref 3.5–5.2)
Sodium: 138 mmol/L (ref 134–144)
Total Protein: 7.1 g/dL (ref 6.0–8.5)
eGFR: 96 mL/min/{1.73_m2} (ref >59)

## 2020-11-21 LAB — HCV INTERPRETATION

## 2020-11-21 LAB — PSA, TOTAL AND FREE
PSA, Free Pct: 50 %
PSA, Free: 0.25 ng/mL
Prostate Specific Ag, Serum: 0.5 ng/mL (ref 0.0–4.0)

## 2020-11-21 LAB — HIV ANTIBODY (ROUTINE TESTING W REFLEX): HIV Screen 4th Generation wRfx: NONREACTIVE

## 2020-11-28 NOTE — Progress Notes (Signed)
MA spoke with Jamesetta So the RN at [facility] and relayed patients results. Copy of Vitamin D OTC and FU for labs in 2 weeks was printed and faxed to facility.

## 2020-12-05 ENCOUNTER — Ambulatory Visit (INDEPENDENT_AMBULATORY_CARE_PROVIDER_SITE_OTHER): Payer: Self-pay | Admitting: Physician Assistant

## 2020-12-05 ENCOUNTER — Encounter: Payer: Self-pay | Admitting: Physician Assistant

## 2020-12-05 ENCOUNTER — Other Ambulatory Visit: Payer: Self-pay

## 2020-12-05 VITALS — BP 134/82 | HR 70 | Temp 98.4°F | Resp 18 | Ht 67.0 in | Wt 196.0 lb

## 2020-12-05 DIAGNOSIS — F1010 Alcohol abuse, uncomplicated: Secondary | ICD-10-CM

## 2020-12-05 DIAGNOSIS — F5104 Psychophysiologic insomnia: Secondary | ICD-10-CM

## 2020-12-05 DIAGNOSIS — E785 Hyperlipidemia, unspecified: Secondary | ICD-10-CM

## 2020-12-05 DIAGNOSIS — I10 Essential (primary) hypertension: Secondary | ICD-10-CM

## 2020-12-05 DIAGNOSIS — F411 Generalized anxiety disorder: Secondary | ICD-10-CM

## 2020-12-05 DIAGNOSIS — F1910 Other psychoactive substance abuse, uncomplicated: Secondary | ICD-10-CM

## 2020-12-05 MED ORDER — TRAZODONE HCL 100 MG PO TABS: 100.0000 mg | ORAL_TABLET | Freq: Every evening | ORAL | 1 refills | Status: DC | PRN

## 2020-12-05 MED ORDER — LISINOPRIL 10 MG PO TABS: 10.0000 mg | ORAL_TABLET | Freq: Every day | ORAL | 1 refills | Status: DC

## 2020-12-05 MED ORDER — FLUOXETINE HCL 20 MG PO TABS: 20.0000 mg | ORAL_TABLET | Freq: Every day | ORAL | 3 refills | Status: DC

## 2020-12-05 NOTE — Patient Instructions (Signed)
For your blood pressure, you are going to take an increased dose of lisinopril, 10 mg once daily in addition to the amlodipine.  Continue checking your blood pressure on a daily basis, keeping a written log and have available for all office visits.  Continue low-sodium diet.  To help with your depression and anxiety, you will start Prozac 20 mg once daily.  You can continue using the hydroxyzine 10 mg every 8 hours as needed to help with anxiety, I do not believe this is causing you to have mood swings.  To help with your insomnia, we are going to increase the trazodone to 100 mg at bedtime.  We will call you with today's lab results.  Kennieth Rad, PA-C Physician Assistant [place] [email or web address]   Managing Stress, Adult Feeling a certain amount of stress is normal. Stress helps our body and mind get ready to deal with the demands of life. Stress hormones can motivate you to do well at work and meet your responsibilities. However severe or long-lasting (chronic) stress can affect your mental and physical health. Chronic stress puts you at higher risk for anxiety, depression, and other health problems like digestiveproblems, muscle aches, heart disease, high blood pressure, and stroke. What are the causes? Common causes of stress include: Demands from work, such as deadlines, feeling overworked, or having long hours. Pressures at home, such as money issues, disagreements with a spouse, or parenting issues. Pressures from major life changes, such as divorce, moving, loss of a loved one, or chronic illness. You may be at higher risk for stress-related problems if you do not get enough sleep, are in poor health, do not have emotional support, or have a mentalhealth disorder like anxiety or depression. How to recognize stress Stress can make you: Have trouble sleeping. Feel sad, anxious, irritable, or overwhelmed. Lose your  appetite. Overeat or want to eat unhealthy foods. Want to use drugs or alcohol. Stress can also cause physical symptoms, such as: Sore, tense muscles, especially in the shoulders and neck. Headaches. Trouble breathing. A faster heart rate. Stomach pain, nausea, or vomiting. Diarrhea or constipation. Trouble concentrating. Follow these instructions at home: Lifestyle Identify the source of your stress and your reaction to it. See a therapist who can help you change your reactions. When there are stressful events: Talk about it with family, friends, or co-workers. Try to think realistically about stressful events and not ignore them or overreact. Try to find the positives in a stressful situation and not focus on the negatives. Cut back on responsibilities at work and home, if possible. Ask for help from friends or family members if you need it. Find ways to cope with stress, such as: Meditation. Deep breathing. Yoga or tai chi. Progressive muscle relaxation. Doing art, playing music, or reading. Making time for fun activities. Spending time with family and friends. Get support from family, friends, or spiritual resources. Eating and drinking Eat a healthy diet. This includes: Eating foods that are high in fiber, such as beans, whole grains, and fresh fruits and vegetables. Limiting foods that are high in fat and processed sugars, such as fried and sweet foods. Do not skip meals or overeat. Drink enough fluid to keep your urine pale yellow. Alcohol use Do not drink alcohol if: Your health care provider tells you not to drink. You are pregnant, may be pregnant, or are planning to become pregnant. Drinking alcohol is a way some people try to ease their stress. This can be  dangerous, so if you drink alcohol: Limit how much you use to: 0-1 drink a day for women. 0-2 drinks a day for men. Be aware of how much alcohol is in your drink. In the [place] one drink equals one 12 oz bottle  of beer (355 mL), one 5 oz glass of wine (148 mL), or one 1 oz glass of hard liquor (44 mL). Activity  Include 30 minutes of exercise in your daily schedule. Exercise is a good stress reducer. Include time in your day for an activity that you find relaxing. Try taking a walk, going on a bike ride, reading a book, or listening to music. Schedule your time in a way that lowers stress, and keep a consistent schedule. Prioritize what is most important to get done.  General instructions Get enough sleep. Try to go to sleep and get up at about the same time every day. Take over-the-counter and prescription medicines only as told by your health care provider. Do not use any products that contain nicotine or tobacco, such as cigarettes, e-cigarettes, and chewing tobacco. If you need help quitting, ask your health care provider. Do not use drugs or smoke to cope with stress. Keep all follow-up visits as told by your health care provider. This is important. Where to find support Talk with your health care provider about stress management or finding a support group. Find a therapist to work with you on your stress management techniques. Contact a health care provider if: Your stress symptoms get worse. You are unable to manage your stress at home. You are struggling to stop using drugs or alcohol. Get help right away if: You may be a danger to yourself or others. You have any thoughts of death or suicide. If you ever feel like you may hurt yourself or others, or have thoughts about taking your own life, get help right away. You can go to your nearest emergency department or call: Your local emergency services (911 in the [place] A suicide crisis helpline, such as the National Suicide Prevention Lifeline at 1-800-273-8255. This is open 24 hours a day. Summary Feeling a certain amount of stress is normal, but severe or long-lasting (chronic) stress can affect your mental and physical health. Chronic  stress can put you at higher risk for anxiety, depression, and other health problems like digestive problems, muscle aches, heart disease, high blood pressure, and stroke. You may be at higher risk for stress-related problems if you do not get enough sleep, are in poor health, lack emotional support, or have a mental health disorder like anxiety or depression. Identify the source of your stress and your reaction to it. Try talking about stressful events with family, friends, or co-workers, finding a coping method, or getting support from spiritual resources. If you need more help, talk with your health care provider about finding a support group or a mental health therapist. This information is not intended to replace advice given to you by your health care provider. Make sure you discuss any questions you have with your healthcare provider. Document Revised: 11/18/2018 Document Reviewed: 11/18/2018 Elsevier Patient Education  2022 [employer]vier Inc.  

## 2020-12-05 NOTE — Progress Notes (Signed)
Patient has not eaten today. Patient has taken medication. Patient reports mood swings of increased depression and irritability since starting hydroxyzine.

## 2020-12-05 NOTE — Progress Notes (Signed)
Established Patient Office Visit  Subjective:  Patient ID: Jeffery Wheeler, male    DOB: March 02, 1966  Age: 55 y.o. MRN: [id number]  CC:  Chief Complaint  Patient presents with   Follow-up    BP/ fasting labs    HPI Jeffery Wheeler reports that he has been checking his blood pressure on a daily basis, states readings have been slightly elevated, slightly more than today.  Denies any hypertensive symptoms.  Reports that he is still having elevated anxiety, and states that he is having more mood swings and more depressed moods.  Reports the hydroxyzine is not offering relief.  Reports that he is taking the trazodone 50 mg at bedtime without relief of insomnia.  Adamantly denies any thoughts of self-harm.  Continues his inpatient treatment for substance abuse at [facility].  States that he will be transitioning to caring services in the next couple of weeks.    Past Medical History:  Diagnosis Date   Eczema    HLD (hyperlipidemia)    HTN (hypertension)    Type 2 diabetes mellitus (HCC)     Past Surgical History:  Procedure Laterality Date   CERVICAL SPINE SURGERY  2018   right shoulder  1991    Family History  Problem Relation Age of Onset   Diabetes type II Sister    Diabetes type II Paternal Grandmother    Diabetes type II Maternal Aunt     Social History   Socioeconomic History   Marital status: Single    Spouse name: Not on file   Number of children: Not on file   Years of education: Not on file   Highest education level: Not on file  Occupational History   Not on file  Tobacco Use   Smoking status: Every Day    Packs/day: 0.50    Types: Cigarettes   Smokeless tobacco: Never  Substance and Sexual Activity   Alcohol use: Not Currently   Drug use: Not Currently   Sexual activity: Yes  Other Topics Concern   Not on file  Social History Narrative   Not on file   Social Determinants of Health   Financial Resource Strain: Not on file  Food Insecurity:  Not on file  Transportation Needs: Not on file  Physical Activity: Not on file  Stress: Not on file  Social Connections: Not on file  Intimate Partner Violence: Not on file    Outpatient Medications Prior to Visit  Medication Sig Dispense Refill   amLODipine (NORVASC) 10 MG tablet Take 1 tablet (10 mg total) by mouth daily. 30 tablet 1   atorvastatin (LIPITOR) 20 MG tablet Take 1 tablet (20 mg total) by mouth daily. 30 tablet 1   hydrOXYzine (ATARAX/VISTARIL) 10 MG tablet Take 1 tablet (10 mg total) by mouth 3 (three) times daily as needed for anxiety. 60 tablet 0   metFORMIN (GLUCOPHAGE) 850 MG tablet Take 1 tablet (850 mg total) by mouth 2 (two) times daily with a meal. 60 tablet 1   lisinopril (ZESTRIL) 5 MG tablet Take 1 tablet (5 mg total) by mouth daily. 30 tablet 1   traZODone (DESYREL) 50 MG tablet Take 0.5-1 tablets (25-50 mg total) by mouth at bedtime as needed for sleep. 30 tablet 3   No facility-administered medications prior to visit.    No Known Allergies  ROS Review of Systems  Constitutional: Negative.   HENT: Negative.    Eyes: Negative.   Respiratory:  Negative for shortness of breath.  Cardiovascular:  Negative for chest pain.  Gastrointestinal: Negative.   Endocrine: Negative.   Genitourinary: Negative.   Musculoskeletal: Negative.   Skin: Negative.   Allergic/Immunologic: Negative.   Neurological: Negative.   Hematological: Negative.   Psychiatric/Behavioral:  Positive for dysphoric mood and sleep disturbance. Negative for self-injury and suicidal ideas. The patient is nervous/anxious.      Objective:    Physical Exam Vitals and nursing note reviewed.  Constitutional:      Appearance: Normal appearance.  HENT:     Head: Normocephalic and atraumatic.     Right Ear: External ear normal.     Left Ear: External ear normal.     Nose: Nose normal.     Mouth/Throat:     Mouth: Mucous membranes are moist.     Pharynx: Oropharynx is clear.  Eyes:      Extraocular Movements: Extraocular movements intact.     Conjunctiva/sclera: Conjunctivae normal.     Pupils: Pupils are equal, round, and reactive to light.  Cardiovascular:     Rate and Rhythm: Normal rate and regular rhythm.     Pulses: Normal pulses.     Heart sounds: Normal heart sounds.  Pulmonary:     Effort: Pulmonary effort is normal.     Breath sounds: Normal breath sounds.  Musculoskeletal:        General: Normal range of motion.     Cervical back: Normal range of motion and neck supple.  Skin:    General: Skin is warm and dry.  Neurological:     General: No focal deficit present.     Mental Status: He is alert and oriented to person, place, and time.  Psychiatric:        Mood and Affect: Mood normal.        Behavior: Behavior normal.        Thought Content: Thought content normal.        Judgment: Judgment normal.    BP 134/82 (BP Location: Right Arm, Patient Position: Sitting, Cuff Size: Normal)   Pulse 70   Temp 98.4 F (36.9 C) (Temporal)   Resp 18   Ht '5\' 7"'  (1.702 m)   Wt 196 lb (88.9 kg)   SpO2 97%   BMI 30.70 kg/m  Wt Readings from Last 3 Encounters:  12/05/20 196 lb (88.9 kg)  11/20/20 190 lb (86.2 kg)  09/12/20 191 lb 11.2 oz (87 kg)     Health Maintenance Due  Topic Date Due   PNEUMOCOCCAL POLYSACCHARIDE VACCINE AGE 35-64 HIGH RISK  Never done   COVID-19 Vaccine (1) Never done   Pneumococcal Vaccine 7-31 Years old (1 - PCV) Never done   OPHTHALMOLOGY EXAM  Never done   TETANUS/TDAP  Never done   COLONOSCOPY (Pts 45-61yr Insurance coverage will need to be confirmed)  Never done   Zoster Vaccines- Shingrix (1 of 2) Never done   INFLUENZA VACCINE  12/04/2020    There are no preventive care reminders to display for this patient.  Lab Results  Component Value Date   TSH 1.530 11/20/2020   Lab Results  Component Value Date   WBC 15.8 (H) 11/20/2020   HGB 14.5 11/20/2020   HCT 44.7 11/20/2020   MCV 87 11/20/2020   PLT 304 11/20/2020    Lab Results  Component Value Date   NA 138 11/20/2020   K 4.3 11/20/2020   GLUCOSE 115 (H) 11/20/2020   BUN 12 11/20/2020   CREATININE 0.93 11/20/2020   BILITOT 0.3  11/20/2020   ALKPHOS 94 11/20/2020   AST 11 11/20/2020   PROT 7.1 11/20/2020   ALBUMIN 4.2 11/20/2020   CALCIUM 9.5 11/20/2020   EGFR 96 11/20/2020   No results found for: CHOL No results found for: HDL No results found for: LDLCALC No results found for: TRIG No results found for: CHOLHDL Lab Results  Component Value Date   HGBA1C 6.5 (A) 11/20/2020      Assessment & Plan:   Problem List Items Addressed This Visit       Cardiovascular and Mediastinum   HTN (hypertension)   Relevant Medications   lisinopril (ZESTRIL) 10 MG tablet     Other   HLD (hyperlipidemia)   Relevant Medications   lisinopril (ZESTRIL) 10 MG tablet   Psychophysiological insomnia - Primary   Generalized anxiety disorder   Relevant Medications   FLUoxetine (PROZAC) 20 MG tablet   traZODone (DESYREL) 100 MG tablet   Alcohol abuse   Substance abuse ([place])    Meds ordered this encounter  Medications   FLUoxetine (PROZAC) 20 MG tablet    Sig: Take 1 tablet (20 mg total) by mouth daily.    Dispense:  30 tablet    Refill:  3    Order Specific Question:   Supervising Provider    Answer:   Asencion Noble E [[id number]]   traZODone (DESYREL) 100 MG tablet    Sig: Take 1 tablet (100 mg total) by mouth at bedtime as needed for sleep.    Dispense:  30 tablet    Refill:  1    Dose change    Order Specific Question:   Supervising Provider    Answer:   Asencion Noble E [[id number]]   lisinopril (ZESTRIL) 10 MG tablet    Sig: Take 1 tablet (10 mg total) by mouth daily.    Dispense:  30 tablet    Refill:  1    Dose change    Order Specific Question:   Supervising Provider    Answer:   Joya Gaskins, PATRICK E [[id number]]  1. Hyperlipidemia, unspecified hyperlipidemia type Fasting labs completed today - Lipid panel  2. Generalized anxiety  disorder Trial Prozac, increase trazodone 100 mg.  Patient education given on stress reduction techniques.  Okay to continue hydroxyzine as needed no refill needed today - FLUoxetine (PROZAC) 20 MG tablet; Take 1 tablet (20 mg total) by mouth daily.  Dispense: 30 tablet; Refill: 3 - traZODone (DESYREL) 100 MG tablet; Take 1 tablet (100 mg total) by mouth at bedtime as needed for sleep.  Dispense: 30 tablet; Refill: 1  3. Primary hypertension Increase lisinopril 10 mg, continue amlodipine.  Patient encouraged to continue checking blood pressure at home, keeping a written log and have it available for all office visits.  Continue following low-sodium diet. - lisinopril (ZESTRIL) 10 MG tablet; Take 1 tablet (10 mg total) by mouth daily.  Dispense: 30 tablet; Refill: 1  4. Psychophysiological insomnia   5. Alcohol abuse Currently in treatment at [facility] treatment center  6. Substance abuse ([place])    I have reviewed the patient's medical history (PMH, PSH, Social History, Family History, Medications, and allergies) , and have been updated if relevant. I spent 31 minutes reviewing chart and  face to face time with patient.     Follow-up: Return in about 4 weeks (around 01/02/2021).    Loraine Grip Mayers, PA-C

## 2020-12-06 ENCOUNTER — Telehealth: Payer: Self-pay | Admitting: *Deleted

## 2020-12-06 LAB — LIPID PANEL
Chol/HDL Ratio: 4.5 ratio (ref 0.0–5.0)
Cholesterol, Total: 185 mg/dL (ref 100–199)
HDL: 41 mg/dL (ref >39)
LDL Chol Calc (NIH): 130 mg/dL — ABNORMAL HIGH (ref 0–99)
Triglycerides: 76 mg/dL (ref 0–149)
VLDL Cholesterol Cal: 14 mg/dL (ref 5–40)

## 2020-12-06 NOTE — Telephone Encounter (Signed)
-----   Message from Roney Jaffe, [place] sent at 12/06/2020  6:20 AM EDT ----- Please call Patient and let him know that his cholesterol is still slightly elevated, he does need to continue taking his cholesterol medication on a daily basis.

## 2020-12-06 NOTE — Telephone Encounter (Signed)
MA spoke with the RN phyllis for patient results. Patient will continue cholesterol medication.

## 2021-08-16 ENCOUNTER — Encounter (HOSPITAL_COMMUNITY): Payer: Self-pay

## 2021-08-16 ENCOUNTER — Other Ambulatory Visit: Payer: Self-pay

## 2021-08-16 ENCOUNTER — Emergency Department (HOSPITAL_COMMUNITY)
Admission: EM | Admit: 2021-08-16 | Discharge: 2021-08-16 | Disposition: A | Discharge status: Home / Self Care | Payer: Self-pay | Attending: Student | Admitting: Student

## 2021-08-16 DIAGNOSIS — E119 Type 2 diabetes mellitus without complications: Secondary | ICD-10-CM | POA: Insufficient documentation

## 2021-08-16 DIAGNOSIS — Z79899 Other long term (current) drug therapy: Secondary | ICD-10-CM | POA: Diagnosis not present

## 2021-08-16 DIAGNOSIS — Z7984 Long term (current) use of oral hypoglycemic drugs: Secondary | ICD-10-CM | POA: Diagnosis not present

## 2021-08-16 DIAGNOSIS — M5442 Lumbago with sciatica, left side: Secondary | ICD-10-CM | POA: Insufficient documentation

## 2021-08-16 DIAGNOSIS — Y990 Civilian activity done for income or pay: Secondary | ICD-10-CM | POA: Insufficient documentation

## 2021-08-16 DIAGNOSIS — S39012A Strain of muscle, fascia and tendon of lower back, initial encounter: Secondary | ICD-10-CM

## 2021-08-16 DIAGNOSIS — F17210 Nicotine dependence, cigarettes, uncomplicated: Secondary | ICD-10-CM | POA: Insufficient documentation

## 2021-08-16 DIAGNOSIS — I10 Essential (primary) hypertension: Secondary | ICD-10-CM | POA: Diagnosis not present

## 2021-08-16 DIAGNOSIS — X500XXA Overexertion from strenuous movement or load, initial encounter: Secondary | ICD-10-CM | POA: Insufficient documentation

## 2021-08-16 MED ORDER — CVS LUMBAR/BACK SUPPORT BRACE MISC: 1.0000 | Freq: Once | 0 refills | Status: AC

## 2021-08-16 MED ORDER — LIDOCAINE 5 % EX PTCH
2.0000 | MEDICATED_PATCH | CUTANEOUS | Status: DC
  Administered 2021-08-16: 2 via TRANSDERMAL
  Filled 2021-08-16: qty 2

## 2021-08-16 MED ORDER — NAPROXEN 375 MG PO TABS: 375.0000 mg | ORAL_TABLET | Freq: Two times a day (BID) | ORAL | 0 refills | Status: DC

## 2021-08-16 MED ORDER — KETOROLAC TROMETHAMINE 15 MG/ML IJ SOLN
15.0000 mg | Freq: Once | INTRAMUSCULAR | Status: AC
  Administered 2021-08-16: 15 mg via INTRAMUSCULAR
  Filled 2021-08-16: qty 1

## 2021-08-16 MED ORDER — LIDOCAINE 4 % EX PTCH: 1.0000 | MEDICATED_PATCH | Freq: Two times a day (BID) | CUTANEOUS | 0 refills | Status: DC

## 2021-08-16 MED ORDER — CYCLOBENZAPRINE HCL 10 MG PO TABS: 10.0000 mg | ORAL_TABLET | Freq: Every evening | ORAL | 0 refills | Status: DC | PRN

## 2021-08-16 NOTE — Discharge Instructions (Addendum)
You were seen in the emergency department for evaluation of back pain.  Your presentation is concerning for a muscle strain.  It is important that you are up and moving over the next few days.  Bedrest will make this worse.  Please refrain from heavy lifting over the next week and take the medications given as prescribed.  If you have heat pack, this would be helpful to use as well as soaking in the tub.  Return the emergency department if you have numbness, tingling, weakness of the legs, have difficulty urinating or any other concerning symptoms. ?

## 2021-08-16 NOTE — ED Triage Notes (Signed)
Pt presents with c/o back pain. Pt reports his back pain has been going on for "quite a while" but yesterday, he was lifting some heavy boxes and hurt it even worse. Pt ambulatory to triage room.  ?

## 2021-08-16 NOTE — ED Provider Notes (Signed)
?[facility] DEPT ?Provider Note ? ?CSN: [id number] ?Arrival date & time: 08/16/21 [id number] ? ?Chief Complaint(s) ?Back Pain ? ?HPI ?Jeffery Wheeler is a 56 y.o. male with PMH HTN, T2DM, HLD, polysubstance abuse who presents emergency room for evaluation of back pain.  Patient states that he has had back pain for multiple weeks but yesterday after living heavy boxes at work his back pain significantly worsened.  He endorses bilateral paraspinal low back pain with occasional sciatica down the left leg.  No associated numbness, tingling, weakness, saddle anesthesia or other red flag signs of back pain.  Denies chest pain, shortness of breath, Donnell pain, nausea, vomiting or other systemic symptoms. ? ? ?Back Pain ? ?Past Medical History ?Past Medical History:  ?Diagnosis Date  ? Eczema   ? HLD (hyperlipidemia)   ? HTN (hypertension)   ? Type 2 diabetes mellitus ([place])   ? ?Patient Active Problem List  ? Diagnosis Date Noted  ? Increased urinary frequency 11/20/2020  ? Nocturia more than twice per night 11/20/2020  ? Incomplete emptying of bladder 11/20/2020  ? Psychophysiological insomnia 11/20/2020  ? Generalized anxiety disorder 11/20/2020  ? Alcohol abuse 11/20/2020  ? Substance abuse ([place]) 11/20/2020  ? HLD (hyperlipidemia) 09/13/2020  ? HTN (hypertension) 09/13/2020  ? Type 2 diabetes mellitus ([place]) 09/13/2020  ? Back pain 09/13/2020  ? Eczema 09/13/2020  ? Tobacco use disorder 09/13/2020  ? ?Home Medication(s) ?Prior to Admission medications   ?Medication Sig Start Date End Date Taking? Authorizing Provider  ?amLODipine (NORVASC) 10 MG tablet Take 1 tablet (10 mg total) by mouth daily. 11/20/20   Mayers, Cari S, PA-C  ?atorvastatin (LIPITOR) 20 MG tablet Take 1 tablet (20 mg total) by mouth daily. 11/20/20   Mayers, Loraine Grip, PA-C  ?FLUoxetine (PROZAC) 20 MG tablet Take 1 tablet (20 mg total) by mouth daily. 12/05/20   Mayers, Cari S, PA-C  ?hydrOXYzine (ATARAX/VISTARIL) 10 MG tablet Take 1  tablet (10 mg total) by mouth 3 (three) times daily as needed for anxiety. 11/20/20   Mayers, Cari S, PA-C  ?lisinopril (ZESTRIL) 10 MG tablet Take 1 tablet (10 mg total) by mouth daily. 12/05/20   Mayers, Cari S, PA-C  ?metFORMIN (GLUCOPHAGE) 850 MG tablet Take 1 tablet (850 mg total) by mouth 2 (two) times daily with a meal. 11/20/20   Mayers, Cari S, PA-C  ?traZODone (DESYREL) 100 MG tablet Take 1 tablet (100 mg total) by mouth at bedtime as needed for sleep. 12/05/20   Mayers, Loraine Grip, PA-C  ?                                                                                                                                  ?Past Surgical History ?Past Surgical History:  ?Procedure Laterality Date  ? CERVICAL SPINE SURGERY  2018  ? right shoulder  1991  ? ?Family History ?Family History  ?Problem Relation Age of Onset  ?  Diabetes type II Sister   ? Diabetes type II Paternal Grandmother   ? Diabetes type II Maternal Aunt   ? ? ?Social History ?Social History  ? ?Tobacco Use  ? Smoking status: Every Day  ?  Packs/day: 0.50  ?  Types: Cigarettes  ? Smokeless tobacco: Never  ?Substance Use Topics  ? Alcohol use: Not Currently  ? Drug use: Not Currently  ? ?Allergies ?Patient has no known allergies. ? ?Review of Systems ?Review of Systems  ?Musculoskeletal:  Positive for back pain.  ? ?Physical Exam ?Vital Signs  ?I have reviewed the triage vital signs ?BP (!) 150/91 (BP Location: Left Arm)   Pulse 92   Temp 98 ?F (36.7 ?C) (Oral)   Resp 18   SpO2 99%  ? ?Physical Exam ?Vitals and nursing note reviewed.  ?Constitutional:   ?   General: He is not in acute distress. ?   Appearance: He is well-developed.  ?HENT:  ?   Head: Normocephalic and atraumatic.  ?Eyes:  ?   Conjunctiva/sclera: Conjunctivae normal.  ?Cardiovascular:  ?   Rate and Rhythm: Normal rate and regular rhythm.  ?   Heart sounds: No murmur heard. ?Pulmonary:  ?   Effort: Pulmonary effort is normal. No respiratory distress.  ?   Breath sounds: Normal breath  sounds.  ?Abdominal:  ?   Palpations: Abdomen is soft.  ?   Tenderness: There is no abdominal tenderness.  ?Musculoskeletal:     ?   General: Tenderness (Bilateral paraspinal, no midline back pain) present. No swelling.  ?   Cervical back: Neck supple.  ?Skin: ?   General: Skin is warm and dry.  ?   Capillary Refill: Capillary refill takes less than 2 seconds.  ?Neurological:  ?   Mental Status: He is alert.  ?   Cranial Nerves: No cranial nerve deficit.  ?   Sensory: No sensory deficit.  ?   Motor: No weakness.  ?Psychiatric:     ?   Mood and Affect: Mood normal.  ? ? ?ED Results and Treatments ?Labs ?(all labs ordered are listed, but only abnormal results are displayed) ?Labs Reviewed - No data to display                                                                                                                       ? ?Radiology ?No results found. ? ?Pertinent labs & imaging results that were available during my care of the patient were reviewed by me and considered in my medical decision making (see MDM for details). ? ?Medications Ordered in ED ?Medications  ?lidocaine (LIDODERM) 5 % 2 patch (2 patches Transdermal Patch Applied 08/16/21 1119)  ?ketorolac (TORADOL) 15 MG/ML injection 15 mg (15 mg Intramuscular Given 08/16/21 1117)  ?                                                               ?                                                                    ?  Procedures ?Procedures ? ?(including critical care time) ? ?Medical Decision Making / ED Course ? ? ?This patient presents to the ED for concern of back pain, this involves an extensive number of treatment options, and is a complaint that carries with it a high risk of complications and morbidity.  The differential diagnosis includes lumbar strain, slipped disc, sciatica, cauda equina ? ?MDM: ?Patient seen emergency room for evaluation of back pain.  Physical exam with no midline L-spine tenderness, positive bilateral paraspinal tenderness.   Neurologic exam unremarkable with no focal motor or sensory deficits.  No saddle anesthesia.  Patient given Lidoderm patches and Toradol and on reevaluation his pain had improved.  Patient able to ambulate on reevaluation.  Patient presentation consistent with lumbar strain and he was discharged with Naprosyn, lidocaine patches and Flexeril for nighttime use. ? ? ?Additional history obtained: ? ?-External records from outside source obtained and reviewed including: Chart review including previous notes, labs, imaging, consultation notes ? ? ? ? ?Medicines ordered and prescription drug management: ?Meds ordered this encounter  ?Medications  ? ketorolac (TORADOL) 15 MG/ML injection 15 mg  ? lidocaine (LIDODERM) 5 % 2 patch  ?  ?-I have reviewed the patients home medicines and have made adjustments as needed ? ?Critical interventions ?none ? ?Cardiac Monitoring: ?The patient was maintained on a cardiac monitor.  I personally viewed and interpreted the cardiac monitored which showed an underlying rhythm of: NSR ? ?Social Determinants of Health:  ?Factors impacting patients care include: History of polysubstance use, in  a current rehab program ? ? ?Reevaluation: ?After the interventions noted above, I reevaluated the patient and found that they have :improved ? ?Co morbidities that complicate the patient evaluation ? ?Past Medical History:  ?Diagnosis Date  ? Eczema   ? HLD (hyperlipidemia)   ? HTN (hypertension)   ? Type 2 diabetes mellitus ([place])   ?  ? ? ?Dispostion: ?I considered admission for this patient, but his presentation is consistent with a lumbar back strain and does not meet inpatient criteria at this time.  He is safe for discharge with outpatient follow-up. ? ? ? ? ?Final Clinical Impression(s) / ED Diagnoses ?Final diagnoses:  ?None  ? ? ? ?@PCDICTATION @ ? ?  ?Teressa Lower, MD ?08/16/21 1549 ? ?

## 2021-10-11 ENCOUNTER — Emergency Department (HOSPITAL_COMMUNITY)
Admission: EM | Admit: 2021-10-11 | Discharge: 2021-10-11 | Disposition: A | Discharge status: Home / Self Care | Payer: Commercial Managed Care - HMO | Attending: Student | Admitting: Student

## 2021-10-11 ENCOUNTER — Emergency Department (HOSPITAL_COMMUNITY): Payer: Commercial Managed Care - HMO

## 2021-10-11 ENCOUNTER — Other Ambulatory Visit: Payer: Self-pay

## 2021-10-11 ENCOUNTER — Encounter (HOSPITAL_COMMUNITY): Payer: Self-pay | Admitting: Emergency Medicine

## 2021-10-11 DIAGNOSIS — Z5321 Procedure and treatment not carried out due to patient leaving prior to being seen by health care provider: Secondary | ICD-10-CM | POA: Diagnosis not present

## 2021-10-11 DIAGNOSIS — H9193 Unspecified hearing loss, bilateral: Secondary | ICD-10-CM | POA: Insufficient documentation

## 2021-10-11 LAB — CBC WITH DIFFERENTIAL/PLATELET
Abs Immature Granulocytes: 0.08 10*3/uL — ABNORMAL HIGH (ref 0.00–0.07)
Basophils Absolute: 0.1 10*3/uL (ref 0.0–0.1)
Basophils Relative: 0 %
Eosinophils Absolute: 0.2 10*3/uL (ref 0.0–0.5)
Eosinophils Relative: 1 %
HCT: 42.8 % (ref 39.0–52.0)
Hemoglobin: 13.7 g/dL (ref 13.0–17.0)
Immature Granulocytes: 0 %
Lymphocytes Relative: 15 %
Lymphs Abs: 2.6 10*3/uL (ref 0.7–4.0)
MCH: 28 pg (ref 26.0–34.0)
MCHC: 32 g/dL (ref 30.0–36.0)
MCV: 87.5 fL (ref 80.0–100.0)
Monocytes Absolute: 0.9 10*3/uL (ref 0.1–1.0)
Monocytes Relative: 5 %
Neutro Abs: 14.1 10*3/uL — ABNORMAL HIGH (ref 1.7–7.7)
Neutrophils Relative %: 79 %
Platelets: 445 10*3/uL — ABNORMAL HIGH (ref 150–400)
RBC: 4.89 MIL/uL (ref 4.22–5.81)
RDW: 15.9 % — ABNORMAL HIGH (ref 11.5–15.5)
WBC: 18 10*3/uL — ABNORMAL HIGH (ref 4.0–10.5)
nRBC: 0 % (ref 0.0–0.2)

## 2021-10-11 LAB — BASIC METABOLIC PANEL
Anion gap: 8 (ref 5–15)
BUN: 12 mg/dL (ref 6–20)
CO2: 24 mmol/L (ref 22–32)
Calcium: 8.7 mg/dL — ABNORMAL LOW (ref 8.9–10.3)
Chloride: 107 mmol/L (ref 98–111)
Creatinine, Ser: 0.99 mg/dL (ref 0.61–1.24)
GFR, Estimated: >60 mL/min (ref >60)
Glucose, Bld: 161 mg/dL — ABNORMAL HIGH (ref 70–99)
Potassium: 3.8 mmol/L (ref 3.5–5.1)
Sodium: 139 mmol/L (ref 135–145)

## 2021-10-11 LAB — TSH: TSH: 1.146 u[IU]/mL (ref 0.350–4.500)

## 2021-10-11 NOTE — ED Provider Triage Note (Signed)
Emergency Medicine Provider Triage Evaluation Note  Jeffery Wheeler , a 57 y.o. male  was evaluated in triage.  Pt complains of bilateral hearing loss x2 weeks.  Started with a cold, progressive decreased hearing loss in both ears.  Having tinnitus to left ear.  No vision changes, headache, sore throat, chest pain, shortness of breath..  Review of Systems  Per HPI Physical Exam  There were no vitals taken for this visit. Gen:   Awake, no distress   Resp:  Normal effort  MSK:   Moves extremities without difficulty  Other:  TM clear bilaterally, no foreign bodies in canal  Medical Decision Making  Medically screening exam initiated at 1:02 PM.  Appropriate orders placed.  Vena Rua was informed that the remainder of the evaluation will be completed by another provider, this initial triage assessment does not replace that evaluation, and the importance of remaining in the ED until their evaluation is complete.     Theron Arista, PA-C 10/11/21 1303

## 2021-10-11 NOTE — ED Triage Notes (Signed)
Pt states two weeks ago pt had a cold and started having hearing loss. Experiencing tinnitus in left ear. Pt has never had hearing issues prior to this.

## 2021-10-16 ENCOUNTER — Ambulatory Visit (HOSPITAL_COMMUNITY)
Admission: RE | Admit: 2021-10-16 | Discharge: 2021-10-16 | Disposition: A | Discharge status: Home / Self Care | Payer: Commercial Managed Care - HMO | Source: Ambulatory Visit | Attending: Student | Admitting: Student

## 2021-10-16 ENCOUNTER — Other Ambulatory Visit: Payer: Self-pay

## 2021-10-16 ENCOUNTER — Encounter (HOSPITAL_COMMUNITY): Payer: Self-pay

## 2021-10-16 VITALS — BP 155/92 | HR 67 | Resp 18

## 2021-10-16 DIAGNOSIS — H9193 Unspecified hearing loss, bilateral: Secondary | ICD-10-CM | POA: Diagnosis not present

## 2021-10-16 MED ORDER — TRIAMCINOLONE ACETONIDE 55 MCG/ACT NA AERO
2.0000 | INHALATION_SPRAY | Freq: Every day | NASAL | 1 refills | Status: DC
Start: 2021-10-16 — End: 2021-12-17

## 2021-10-16 NOTE — ED Triage Notes (Signed)
Pt reports his ears have been stopped up for 21/2 weeks. Pt reports he can not hear out of them.

## 2021-10-16 NOTE — Discharge Instructions (Signed)
-  Nasacort once daily while symptoms persist -If symptoms are not improving next week follow-up with an ear nose and throat doctor. information below.

## 2021-10-16 NOTE — ED Provider Notes (Signed)
[facility]    CSN: [id number] Arrival date & time: 10/16/21  1602      History   Chief Complaint Chief Complaint  Patient presents with   Ear Fullness    Entered by patient    HPI Jeffery Wheeler is a 56 y.o. male presenting with decreased hearing for 2 to 3 weeks.  History noncontributory.  He describes sudden onset of decreased hearing bilaterally.  This is not associated with any ear pain, ear discharge, dizziness, tinnitus.  Denies recent URI, cough, congestion, fevers.  He denies allergic rhinitis component.  Has not attempted medications for the symptoms.  HPI  History reviewed. No pertinent past medical history.  There are no problems to display for this patient.   History reviewed. No pertinent surgical history.     Home Medications    Prior to Admission medications   Medication Sig Start Date End Date Taking? Authorizing Provider  triamcinolone (NASACORT) 55 MCG/ACT AERO nasal inhaler Place 2 sprays into the nose daily. 10/16/21  Yes Rhys Martini, PA-C    Family History History reviewed. No pertinent family history.  Social History Social History   Tobacco Use   Smoking status: Never   Smokeless tobacco: Never     Allergies   Patient has no known allergies.   Review of Systems Review of Systems  HENT:  Positive for hearing loss.   All other systems reviewed and are negative.    Physical Exam Triage Vital Signs ED Triage Vitals  Enc Vitals Group     BP 10/16/21 1654 (!) 155/92     Pulse Rate 10/16/21 1654 67     Resp 10/16/21 1654 18     Temp --      Temp src --      SpO2 10/16/21 1654 97 %     Weight --      Height --      Head Circumference --      Peak Flow --      Pain Score 10/16/21 1652 0     Pain Loc --      Pain Edu? --      Excl. in GC? --    No data found.  Updated Vital Signs BP (!) 155/92   Pulse 67   Resp 18   SpO2 97%   Visual Acuity Right Eye Distance:   Left Eye Distance:   Bilateral  Distance:    Right Eye Near:   Left Eye Near:    Bilateral Near:     Physical Exam Vitals reviewed.  Constitutional:      General: He is not in acute distress.    Appearance: Normal appearance. He is not ill-appearing.  HENT:     Head: Normocephalic and atraumatic.     Right Ear: Tympanic membrane, ear canal and external ear normal. No swelling or tenderness. No middle ear effusion. There is no impacted cerumen. No mastoid tenderness. Tympanic membrane is not perforated, erythematous, retracted or bulging.     Left Ear: Tympanic membrane, ear canal and external ear normal. No swelling or tenderness.  No middle ear effusion. There is no impacted cerumen. No mastoid tenderness. Tympanic membrane is not perforated, erythematous, retracted or bulging.     Ears:     Comments: Pt is hard of hearing. TMS are healthy and intact with cone of light. No cerumen present.  Pulmonary:     Effort: Pulmonary effort is normal.  Neurological:     General: No  focal deficit present.     Mental Status: He is alert and oriented to person, place, and time.  Psychiatric:        Mood and Affect: Mood normal.        Behavior: Behavior normal.        Thought Content: Thought content normal.        Judgment: Judgment normal.      UC Treatments / Results  Labs (all labs ordered are listed, but only abnormal results are displayed) Labs Reviewed - No data to display  EKG   Radiology No results found.  Procedures Procedures (including critical care time)  Medications Ordered in UC Medications - No data to display  Initial Impression / Assessment and Plan / UC Course  I have reviewed the triage vital signs and the nursing notes.  Pertinent labs & imaging results that were available during my care of the patient were reviewed by me and considered in my medical decision making (see chart for details).     This patient is a very pleasant 56 y.o. year old male presenting with hearing loss x3 weeks.  Afebrile, nontachy. No recent URI. No recent barotrauma. TMs appear healthy and intact with cone of light; there is no cerumen or canal swelling. Trial of nasacort for possible allergic rhintis component but advised f/u with ENT, information provided. ED return precautions discussed. Patient verbalizes understanding and agreement.    Final Clinical Impressions(s) / UC Diagnoses   Final diagnoses:  Bilateral change in hearing     Discharge Instructions      -Nasacort once daily while symptoms persist -If symptoms are not improving next week follow-up with an ear nose and throat doctor. information below.    ED Prescriptions     Medication Sig Dispense Auth. Provider   triamcinolone (NASACORT) 55 MCG/ACT AERO nasal inhaler Place 2 sprays into the nose daily. 1 each Rhys Martini, PA-C      PDMP not reviewed this encounter.   Rhys Martini, PA-C 10/16/21 [phone]

## 2021-10-17 ENCOUNTER — Encounter (HOSPITAL_COMMUNITY): Payer: Self-pay | Admitting: Emergency Medicine

## 2021-11-22 DIAGNOSIS — Z0279 Encounter for issue of other medical certificate: Secondary | ICD-10-CM | POA: Diagnosis present

## 2021-11-22 DIAGNOSIS — Z5321 Procedure and treatment not carried out due to patient leaving prior to being seen by health care provider: Secondary | ICD-10-CM | POA: Diagnosis not present

## 2021-11-22 DIAGNOSIS — Z20822 Contact with and (suspected) exposure to covid-19: Secondary | ICD-10-CM | POA: Diagnosis not present

## 2021-11-23 ENCOUNTER — Other Ambulatory Visit: Payer: Self-pay

## 2021-11-23 ENCOUNTER — Emergency Department (HOSPITAL_COMMUNITY)
Admission: EM | Admit: 2021-11-23 | Discharge: 2021-11-23 | Discharge status: Against Medical Advice | Payer: Commercial Managed Care - HMO | Attending: Emergency Medicine | Admitting: Emergency Medicine

## 2021-11-23 ENCOUNTER — Encounter (HOSPITAL_COMMUNITY): Payer: Self-pay | Admitting: Emergency Medicine

## 2021-11-23 LAB — CBC WITH DIFFERENTIAL/PLATELET
Abs Immature Granulocytes: 0 10*3/uL (ref 0.00–0.07)
Basophils Absolute: 0 10*3/uL (ref 0.0–0.1)
Basophils Relative: 0 %
Eosinophils Absolute: 0 10*3/uL (ref 0.0–0.5)
Eosinophils Relative: 0 %
HCT: 42.9 % (ref 39.0–52.0)
Hemoglobin: 13.7 g/dL (ref 13.0–17.0)
Lymphocytes Relative: 9 %
Lymphs Abs: 2.4 10*3/uL (ref 0.7–4.0)
MCH: 28.2 pg (ref 26.0–34.0)
MCHC: 31.9 g/dL (ref 30.0–36.0)
MCV: 88.3 fL (ref 80.0–100.0)
Monocytes Absolute: 1.1 10*3/uL — ABNORMAL HIGH (ref 0.1–1.0)
Monocytes Relative: 4 %
Neutro Abs: 23.2 10*3/uL — ABNORMAL HIGH (ref 1.7–7.7)
Neutrophils Relative %: 87 %
Platelets: 423 10*3/uL — ABNORMAL HIGH (ref 150–400)
RBC: 4.86 MIL/uL (ref 4.22–5.81)
RDW: 14.9 % (ref 11.5–15.5)
WBC: 26.7 10*3/uL — ABNORMAL HIGH (ref 4.0–10.5)
nRBC: 0 % (ref 0.0–0.2)
nRBC: 0 /100 WBC

## 2021-11-23 LAB — COMPREHENSIVE METABOLIC PANEL
ALT: 23 U/L (ref 0–44)
AST: 34 U/L (ref 15–41)
Albumin: 3.5 g/dL (ref 3.5–5.0)
Alkaline Phosphatase: 66 U/L (ref 38–126)
Anion gap: 11 (ref 5–15)
BUN: 16 mg/dL (ref 6–20)
CO2: 20 mmol/L — ABNORMAL LOW (ref 22–32)
Calcium: 9.1 mg/dL (ref 8.9–10.3)
Chloride: 105 mmol/L (ref 98–111)
Creatinine, Ser: 1.15 mg/dL (ref 0.61–1.24)
GFR, Estimated: >60 mL/min (ref >60)
Glucose, Bld: 122 mg/dL — ABNORMAL HIGH (ref 70–99)
Potassium: 3.6 mmol/L (ref 3.5–5.1)
Sodium: 136 mmol/L (ref 135–145)
Total Bilirubin: 0.7 mg/dL (ref 0.3–1.2)
Total Protein: 6.4 g/dL — ABNORMAL LOW (ref 6.5–8.1)

## 2021-11-23 LAB — RAPID URINE DRUG SCREEN, HOSP PERFORMED
Amphetamines: NOT DETECTED
Barbiturates: NOT DETECTED
Benzodiazepines: NOT DETECTED
Cocaine: POSITIVE — AB
Opiates: NOT DETECTED
Tetrahydrocannabinol: POSITIVE — AB

## 2021-11-23 LAB — RESP PANEL BY RT-PCR (FLU A&B, COVID) ARPGX2
Influenza A by PCR: NEGATIVE
Influenza B by PCR: NEGATIVE
SARS Coronavirus 2 by RT PCR: NEGATIVE

## 2021-11-23 LAB — ETHANOL: Alcohol, Ethyl (B): 15 mg/dL — ABNORMAL HIGH (ref <10)

## 2021-11-23 NOTE — ED Notes (Signed)
Called pt x3 for vitals, no response. 

## 2021-11-23 NOTE — ED Triage Notes (Signed)
Pt reported to ED requesting detox.

## 2021-11-23 NOTE — ED Provider Triage Note (Signed)
  Emergency Medicine Provider Triage Evaluation Note  MRN:  [id number]  Arrival date & time: 11/23/21    Medically screening exam initiated at 12:32 AM.   CC:   Medical Clearance   HPI:  Jeffery Wheeler is a 56 y.o. year-old male presents to the ED with chief complaint of requesting detox from alcohol and drugs.  He states that he needs to be admitted for this.  He states that he needs to get off the street and states that he is homeless.  Denies recent illnesses.    After I notified the patient that we do not do inpatient detox, but that I was happy to do his medical screening exam so that he can take this to [facility], he then said that he was suicidal.  History provided by patient. ROS:  -As included in HPI PE:   Vitals:   11/23/21 0028  BP: 120/72  Pulse: 84  Resp: 16  Temp: 98.8 F (37.1 C)  SpO2: 94%    Non-toxic appearing No respiratory distress Appear intoxicated MDM:  Based on signs and symptoms, ETOH intoxication is highest on my differential, followed by homelessness. I've ordered labs in triage to expedite lab/diagnostic workup.  I have a low suspicion that the patient is truly suicidal.  I will perform medical clearance work-up, but at this time, I do not feel the patient needs to be IVC need or under one-to-one monitoring.  Patient was informed that the remainder of the evaluation will be completed by another provider, this initial triage assessment does not replace that evaluation, and the importance of remaining in the ED until their evaluation is complete.    Roxy Horseman, PA-C 11/23/21 [phone]

## 2021-12-02 ENCOUNTER — Emergency Department (HOSPITAL_BASED_OUTPATIENT_CLINIC_OR_DEPARTMENT_OTHER)
Admission: EM | Admit: 2021-12-02 | Discharge: 2021-12-02 | Disposition: A | Discharge status: Home / Self Care | Payer: Commercial Managed Care - HMO | Attending: Emergency Medicine | Admitting: Emergency Medicine

## 2021-12-02 ENCOUNTER — Encounter (HOSPITAL_BASED_OUTPATIENT_CLINIC_OR_DEPARTMENT_OTHER): Payer: Self-pay | Admitting: Emergency Medicine

## 2021-12-02 ENCOUNTER — Other Ambulatory Visit: Payer: Self-pay

## 2021-12-02 DIAGNOSIS — R42 Dizziness and giddiness: Secondary | ICD-10-CM | POA: Insufficient documentation

## 2021-12-02 DIAGNOSIS — Z79899 Other long term (current) drug therapy: Secondary | ICD-10-CM | POA: Insufficient documentation

## 2021-12-02 DIAGNOSIS — F17200 Nicotine dependence, unspecified, uncomplicated: Secondary | ICD-10-CM | POA: Diagnosis not present

## 2021-12-02 DIAGNOSIS — E119 Type 2 diabetes mellitus without complications: Secondary | ICD-10-CM | POA: Diagnosis not present

## 2021-12-02 DIAGNOSIS — R519 Headache, unspecified: Secondary | ICD-10-CM | POA: Insufficient documentation

## 2021-12-02 DIAGNOSIS — I10 Essential (primary) hypertension: Secondary | ICD-10-CM | POA: Insufficient documentation

## 2021-12-02 DIAGNOSIS — Z7984 Long term (current) use of oral hypoglycemic drugs: Secondary | ICD-10-CM | POA: Insufficient documentation

## 2021-12-02 LAB — BASIC METABOLIC PANEL
Anion gap: 3 — ABNORMAL LOW (ref 5–15)
BUN: 11 mg/dL (ref 6–20)
CO2: 26 mmol/L (ref 22–32)
Calcium: 8.5 mg/dL — ABNORMAL LOW (ref 8.9–10.3)
Chloride: 108 mmol/L (ref 98–111)
Creatinine, Ser: 0.89 mg/dL (ref 0.61–1.24)
GFR, Estimated: >60 mL/min (ref >60)
Glucose, Bld: 206 mg/dL — ABNORMAL HIGH (ref 70–99)
Potassium: 4 mmol/L (ref 3.5–5.1)
Sodium: 137 mmol/L (ref 135–145)

## 2021-12-02 LAB — CBC
HCT: 41.3 % (ref 39.0–52.0)
Hemoglobin: 13.3 g/dL (ref 13.0–17.0)
MCH: 27.6 pg (ref 26.0–34.0)
MCHC: 32.2 g/dL (ref 30.0–36.0)
MCV: 85.7 fL (ref 80.0–100.0)
Platelets: 471 10*3/uL — ABNORMAL HIGH (ref 150–400)
RBC: 4.82 MIL/uL (ref 4.22–5.81)
RDW: 14.1 % (ref 11.5–15.5)
WBC: 15.1 10*3/uL — ABNORMAL HIGH (ref 4.0–10.5)
nRBC: 0 % (ref 0.0–0.2)

## 2021-12-02 LAB — CBG MONITORING, ED: Glucose-Capillary: 210 mg/dL — ABNORMAL HIGH (ref 70–99)

## 2021-12-02 LAB — URINALYSIS, ROUTINE W REFLEX MICROSCOPIC
Bilirubin Urine: NEGATIVE
Glucose, UA: 250 mg/dL — AB
Hgb urine dipstick: NEGATIVE
Ketones, ur: NEGATIVE mg/dL
Leukocytes,Ua: NEGATIVE
Nitrite: NEGATIVE
Protein, ur: NEGATIVE mg/dL
Specific Gravity, Urine: 1.02 (ref 1.005–1.030)
pH: 7 (ref 5.0–8.0)

## 2021-12-02 LAB — SALICYLATE LEVEL: Salicylate Lvl: <7 mg/dL — ABNORMAL LOW (ref 7.0–30.0)

## 2021-12-02 LAB — ACETAMINOPHEN LEVEL: Acetaminophen (Tylenol), Serum: <10 ug/mL — ABNORMAL LOW (ref 10–30)

## 2021-12-02 LAB — ETHANOL: Alcohol, Ethyl (B): <10 mg/dL (ref <10)

## 2021-12-02 MED ORDER — PROCHLORPERAZINE EDISYLATE 10 MG/2ML IJ SOLN
10.0000 mg | Freq: Once | INTRAMUSCULAR | Status: AC
  Administered 2021-12-02: 10 mg via INTRAVENOUS
  Filled 2021-12-02: qty 2

## 2021-12-02 MED ORDER — KETOROLAC TROMETHAMINE 15 MG/ML IJ SOLN
15.0000 mg | Freq: Once | INTRAMUSCULAR | Status: AC
  Administered 2021-12-02: 15 mg via INTRAVENOUS
  Filled 2021-12-02: qty 1

## 2021-12-02 NOTE — ED Notes (Signed)
States had been homeless and had misplaced his BP meds , x 3 weeks just started back on them 2 days ago

## 2021-12-02 NOTE — ED Triage Notes (Signed)
Pt reports he feels lightheaded today with his home CBG of 235 today and BP was elevated. Has had 2 episodes of diarrhea a day and R side HA with light sensitivity. No chest pain or shortness of breath.

## 2021-12-02 NOTE — Discharge Instructions (Signed)
You were seen today due to a headache.  This resolved with medication and IV fluids.  This may have been a migraine.  Please follow-up with your primary care provider.  If you continue to have headaches they can order further test if necessary.  You also need to discuss your glucose control with your primary care provider.  There was no need to administer any medication for your blood glucose at today's visit but your increased average blood glucose does warrant follow-up.  Return to the emergency department if you develop any life-threatening conditions

## 2021-12-02 NOTE — ED Provider Notes (Signed)
[facility] EMERGENCY DEPARTMENT Provider Note   CSN: [id number] Arrival date & time: 12/02/21  1024     History  Chief Complaint  Patient presents with   Dizziness    Jeffery Wheeler is a 56 y.o. male.  Patient presents to the hospital complaining of vague lightheadedness over the past few days, increased blood glucose readings, and right-sided headache with photophobia.  The patient states he just recently got access to blood glucose testing supplies.  He has been checking his glucose daily over the past week or so.  He states that his blood sugars have been in the 200-250 range for most that time.  He currently takes metformin for his diabetes.  He states he has felt somewhat lightheaded over the past few days.  He denies dizziness or vertiginous type symptoms.  He is concerned that his blood sugar may be driving his symptoms.  He also complains of a right-sided headache with some mild photophobia.  He states he has had similar headaches but that the photophobia is new to him.  He has taken no medication for the headaches at home.  Patient denies shortness of breath, chest pain, headache.  Patient with history of hypertension, type 2 diabetes, hyperlipidemia, alcohol abuse, tobacco use disorder, generalized anxiety disorder, psychophysiological insomnia  HPI     Home Medications Prior to Admission medications   Medication Sig Start Date End Date Taking? Authorizing Provider  amLODipine (NORVASC) 10 MG tablet Take 1 tablet (10 mg total) by mouth daily. 11/20/20   Mayers, Cari S, PA-C  atorvastatin (LIPITOR) 20 MG tablet Take 1 tablet (20 mg total) by mouth daily. 11/20/20   Mayers, Cari S, PA-C  cyclobenzaprine (FLEXERIL) 10 MG tablet Take 1 tablet (10 mg total) by mouth at bedtime as needed for muscle spasms. 08/16/21   Kommor, Madison, MD  FLUoxetine (PROZAC) 20 MG tablet Take 1 tablet (20 mg total) by mouth daily. 12/05/20   Mayers, Cari S, PA-C  hydrOXYzine (ATARAX/VISTARIL)  10 MG tablet Take 1 tablet (10 mg total) by mouth 3 (three) times daily as needed for anxiety. 11/20/20   Mayers, Cari S, PA-C  Lidocaine (SALONPAS PAIN RELIEVING) 4 % PTCH Apply 1 each topically every 12 (twelve) hours. 08/16/21   Kommor, Madison, MD  lisinopril (ZESTRIL) 10 MG tablet Take 1 tablet (10 mg total) by mouth daily. 12/05/20   Mayers, Cari S, PA-C  metFORMIN (GLUCOPHAGE) 850 MG tablet Take 1 tablet (850 mg total) by mouth 2 (two) times daily with a meal. 11/20/20   Mayers, Cari S, PA-C  naproxen (NAPROSYN) 375 MG tablet Take 1 tablet (375 mg total) by mouth 2 (two) times daily. 08/16/21   Kommor, Madison, MD  traZODone (DESYREL) 100 MG tablet Take 1 tablet (100 mg total) by mouth at bedtime as needed for sleep. 12/05/20   Mayers, Cari S, PA-C  triamcinolone (NASACORT) 55 MCG/ACT AERO nasal inhaler Place 2 sprays into the nose daily. 10/16/21   Rhys Martini, PA-C      Allergies    Patient has no active allergies.    Review of Systems   Review of Systems  Constitutional:  Negative for fever.  Eyes:  Positive for photophobia.  Respiratory:  Negative for shortness of breath.   Cardiovascular:  Negative for chest pain.  Gastrointestinal:  Negative for abdominal pain, constipation, diarrhea, nausea and vomiting.  Neurological:  Positive for light-headedness and headaches. Negative for dizziness, syncope, facial asymmetry, speech difficulty and weakness.  Physical Exam Updated Vital Signs BP 138/84   Pulse 74   Temp 98.8 F (37.1 C) (Oral)   Resp (!) 24   SpO2 100%  Physical Exam Vitals and nursing note reviewed.  Constitutional:      General: He is not in acute distress.    Appearance: He is normal weight.  HENT:     Head: Normocephalic and atraumatic.     Mouth/Throat:     Mouth: Mucous membranes are moist.  Eyes:     Extraocular Movements: Extraocular movements intact.     Conjunctiva/sclera: Conjunctivae normal.     Pupils: Pupils are equal, round, and reactive to  light.  Cardiovascular:     Rate and Rhythm: Normal rate and regular rhythm.     Pulses: Normal pulses.     Heart sounds: Normal heart sounds.  Pulmonary:     Effort: Pulmonary effort is normal.     Breath sounds: Normal breath sounds.  Abdominal:     Palpations: Abdomen is soft.     Tenderness: There is no abdominal tenderness.  Musculoskeletal:        General: Normal range of motion.     Cervical back: Normal range of motion. No rigidity or tenderness.  Skin:    General: Skin is warm and dry.  Neurological:     Mental Status: He is alert.     Comments: Cranial nerves II through VII, XI, XII intact Normal heel-to-shin Normal gait Normal speech pattern No facial droop Equal grip strength bilaterally and equal EHL strength bilaterally     ED Results / Procedures / Treatments   Labs (all labs ordered are listed, but only abnormal results are displayed) Labs Reviewed  BASIC METABOLIC PANEL - Abnormal; Notable for the following components:      Result Value   Glucose, Bld 206 (*)    Calcium 8.5 (*)    Anion gap 3 (*)    All other components within normal limits  CBC - Abnormal; Notable for the following components:   WBC 15.1 (*)    Platelets 471 (*)    All other components within normal limits  URINALYSIS, ROUTINE W REFLEX MICROSCOPIC - Abnormal; Notable for the following components:   Glucose, UA 250 (*)    All other components within normal limits  ACETAMINOPHEN LEVEL - Abnormal; Notable for the following components:   Acetaminophen (Tylenol), Serum <10 (*)    All other components within normal limits  SALICYLATE LEVEL - Abnormal; Notable for the following components:   Salicylate Lvl <7.0 (*)    All other components within normal limits  CBG MONITORING, ED - Abnormal; Notable for the following components:   Glucose-Capillary 210 (*)    All other components within normal limits  ETHANOL  CBG MONITORING, ED    EKG None  Radiology No results  found.  Procedures Procedures    Medications Ordered in ED Medications  ketorolac (TORADOL) 15 MG/ML injection 15 mg (15 mg Intravenous Given 12/02/21 1204)  prochlorperazine (COMPAZINE) injection 10 mg (10 mg Intravenous Given 12/02/21 1204)    ED Course/ Medical Decision Making/ A&P Clinical Course as of 12/02/21 1354  Sun Dec 02, 2021  1228 HCT: 41.3 [LM]    Clinical Course User Index [LM] Pamala Duffel                           Medical Decision Making Amount and/or Complexity of Data Reviewed Labs: ordered. Decision-making  details documented in ED Course.  Risk Prescription drug management.   Patient presents with a chief complaint of lightheadedness and headache.  Differential includes but is not limited to migraine, other headache disorder, hypoglycemia, hyperglycemia, meningitis, and others  I reviewed the patient's medical history.  He is most recently seen in an outside emergency department for suicidal ideation and alcoholic intoxication.  He was also admitted in March of this year for behavioral health concerns.  I ordered and reviewed lab results.  Pertinent results include WBC 15.1, consistent with baseline, glucose 206, calcium 8.5, 250 glucose in urine  EKG shows underlying sinus rhythm  I ordered the patient Toradol and Compazine for his headache.  Upon reassessment he has improvement in his headache symptoms  The patient's glucose is high but not concerning from an emergency perspective.  He states he is currently on monotherapy using metformin.  I believe he can follow-up with his primary care provider to discuss further diabetic management  The patient's "lightheadedness" improved with both fluids and medications for his headache.  This may have been due to a headache or the patient may have had mild dehydration.  He has no meningeal symptoms such as neck stiffness.  There were some migraine-like symptoms including the one-sided nature of his  headache and the photosensitivity, but it is not clear if this is a migraine or not.  I see no reason for imaging at this time as the patient has no red flag symptoms such as worst headache of his life or sudden onset.  I believe the patient may discharge home at this time and continue to take over-the-counter medicines for his headache such as Tylenol and follow-up with his PCP as needed.       Final Clinical Impression(s) / ED Diagnoses Final diagnoses:  Acute nonintractable headache, unspecified headache type  Lightheadedness    Rx / DC Orders ED Discharge Orders     None         Pamala Duffel 12/02/21 1354    Milagros Loll, MD 12/02/21 1525

## 2021-12-17 ENCOUNTER — Encounter: Payer: Self-pay | Admitting: Physician Assistant

## 2021-12-17 ENCOUNTER — Ambulatory Visit: Payer: Commercial Managed Care - HMO | Admitting: Physician Assistant

## 2021-12-17 VITALS — BP 150/86 | HR 69 | Resp 18 | Ht 67.0 in | Wt 192.4 lb

## 2021-12-17 DIAGNOSIS — F1010 Alcohol abuse, uncomplicated: Secondary | ICD-10-CM

## 2021-12-17 DIAGNOSIS — Z125 Encounter for screening for malignant neoplasm of prostate: Secondary | ICD-10-CM

## 2021-12-17 DIAGNOSIS — L2082 Flexural eczema: Secondary | ICD-10-CM

## 2021-12-17 DIAGNOSIS — G8929 Other chronic pain: Secondary | ICD-10-CM

## 2021-12-17 DIAGNOSIS — I10 Essential (primary) hypertension: Secondary | ICD-10-CM | POA: Diagnosis not present

## 2021-12-17 DIAGNOSIS — F1410 Cocaine abuse, uncomplicated: Secondary | ICD-10-CM

## 2021-12-17 DIAGNOSIS — F17200 Nicotine dependence, unspecified, uncomplicated: Secondary | ICD-10-CM

## 2021-12-17 DIAGNOSIS — M25512 Pain in left shoulder: Secondary | ICD-10-CM

## 2021-12-17 DIAGNOSIS — E559 Vitamin D deficiency, unspecified: Secondary | ICD-10-CM

## 2021-12-17 DIAGNOSIS — E785 Hyperlipidemia, unspecified: Secondary | ICD-10-CM | POA: Diagnosis not present

## 2021-12-17 DIAGNOSIS — F1210 Cannabis abuse, uncomplicated: Secondary | ICD-10-CM

## 2021-12-17 DIAGNOSIS — F3341 Major depressive disorder, recurrent, in partial remission: Secondary | ICD-10-CM

## 2021-12-17 DIAGNOSIS — D72829 Elevated white blood cell count, unspecified: Secondary | ICD-10-CM

## 2021-12-17 DIAGNOSIS — E119 Type 2 diabetes mellitus without complications: Secondary | ICD-10-CM

## 2021-12-17 LAB — POCT GLYCOSYLATED HEMOGLOBIN (HGB A1C): Hemoglobin A1C: 6.8 % — AB (ref 4.0–5.6)

## 2021-12-17 MED ORDER — GABAPENTIN 300 MG PO CAPS: 600.0000 mg | ORAL_CAPSULE | Freq: Every day | ORAL | 1 refills | Status: DC

## 2021-12-17 MED ORDER — TRIAMCINOLONE ACETONIDE 0.1 % EX OINT: 1.0000 | TOPICAL_OINTMENT | Freq: Two times a day (BID) | CUTANEOUS | 1 refills | Status: DC

## 2021-12-17 MED ORDER — ATORVASTATIN CALCIUM 20 MG PO TABS: 20.0000 mg | ORAL_TABLET | Freq: Every day | ORAL | 1 refills | Status: DC

## 2021-12-17 MED ORDER — CYCLOBENZAPRINE HCL 10 MG PO TABS: 10.0000 mg | ORAL_TABLET | Freq: Every evening | ORAL | 1 refills | Status: DC | PRN

## 2021-12-17 MED ORDER — METFORMIN HCL 850 MG PO TABS: 850.0000 mg | ORAL_TABLET | Freq: Two times a day (BID) | ORAL | 1 refills | Status: DC

## 2021-12-17 MED ORDER — AMLODIPINE BESYLATE 10 MG PO TABS: 10.0000 mg | ORAL_TABLET | Freq: Every day | ORAL | 1 refills | Status: DC

## 2021-12-17 MED ORDER — SERTRALINE HCL 50 MG PO TABS: 50.0000 mg | ORAL_TABLET | Freq: Every day | ORAL | 1 refills | Status: DC

## 2021-12-17 NOTE — Progress Notes (Signed)
Established Patient Office Visit  Subjective   Patient ID: Jeffery Wheeler, male    DOB: Aug 21, 1965  Age: 56 y.o. MRN: [id number]  Chief Complaint  Patient presents with  . Medication Refill    8/22 1. Type 2 diabetes mellitus without complication, without long-term current use of insulin (HCC) A1c 6.5.  Encourage patient to continue checking blood glucose levels at home, keep a written log and have available for all office visits.  Continue current regimen.  Patient education given on low sugar diet.   - Microalbumin / creatinine urine ratio - HgB A1c - Glucose (CBG) - metFORMIN (GLUCOPHAGE) 850 MG tablet; Take 1 tablet (850 mg total) by mouth 2 (two) times daily with a meal.  Dispense: 60 tablet; Refill: 1 - Vitamin D, 25-hydroxy - TSH - Comp. Metabolic Panel (12) - CBC with Differential/Platelet  2. Primary hypertension Continue amlodipine, trial lisinopril 5 mg.  Patient encouraged to check blood pressure on a daily basis, keep a written log and have available for all office visits.  Patient to return to mobile unit in 2 weeks for follow-up and complete lipid panel.  Patient encouraged to follow a low-sodium diet. - amLODipine (NORVASC) 10 MG tablet; Take 1 tablet (10 mg total) by mouth daily.  Dispense: 30 tablet; Refill: 1 - lisinopril (ZESTRIL) 5 MG tablet; Take 1 tablet (5 mg total) by mouth daily.  Dispense: 30 tablet; Refill: 1  3. Hyperlipidemia, unspecified hyperlipidemia type Continue current regimen - Lipid panel; Future - atorvastatin (LIPITOR) 20 MG tablet; Take 1 tablet (20 mg total) by mouth daily.  Dispense: 30 tablet; Refill: 1  4. Increased urinary frequency Negative for urinary tract infection, encouraged patient to limit evening liquids - POCT URINALYSIS DIP (CLINITEK)  5. Incomplete emptying of bladder   6. Nocturia more than twice per night   7. Psychophysiological insomnia   8. Generalized anxiety disorder Trial trazodone for  insomnia, hydroxyzine for anxiety.  Patient education given on good sleep hygiene - traZODone (DESYREL) 50 MG tablet; Take 0.5-1 tablets (25-50 mg total) by mouth at bedtime as needed for sleep.  Dispense: 30 tablet; Refill: 3 - hydrOXYzine (ATARAX/VISTARIL) 10 MG tablet; Take 1 tablet (10 mg total) by mouth 3 (three) times daily as needed for anxiety.  Dispense: 60 tablet; Refill: 0  9. Alcohol abuse Patient is currently in residential treatment program at [facility]  10. Substance abuse (HCC)   11. Tobacco use disorder   12. Screening PSA (prostate specific antigen)  -  - PSA, total and free  13. Screening for HIV (human immunodeficiency virus)  - - HIV antibody (with reflex)  14. Encounter for HCV screening test for low risk patient   - HCV Ab w Reflex to Quant PCR   [facility] - 27th of July - not sure of ltc  Mood is depressed hard time with relapse  Sleep is poor - pain in upper left back for the past week - using ibuprofen without relief - no njury trauma  Excema - tube of ointment - cream does not help    Past Medical History:  Diagnosis Date  . Eczema   . HLD (hyperlipidemia)   . HTN (hypertension)   . Type 2 diabetes mellitus (HCC)    Social History   Socioeconomic History  . Marital status: Single    Spouse name: Not on file  . Number of children: Not on file  . Years of education: Not on file  . Highest education level: Not  on file  Occupational History  . Not on file  Tobacco Use  . Smoking status: Never  . Smokeless tobacco: Never  Substance and Sexual Activity  . Alcohol use: Not Currently  . Drug use: Not Currently  . Sexual activity: Yes  Other Topics Concern  . Not on file  Social History Narrative   ** Merged History Encounter **       Social Determinants of Health   Financial Resource Strain: Not on file  Food Insecurity: Not on file  Transportation Needs: Not on file  Physical Activity: Not on file  Stress: Not on  file  Social Connections: Not on file  Intimate Partner Violence: Not on file   Family History  Problem Relation Age of Onset  . Diabetes type II Sister   . Diabetes type II Paternal Grandmother   . Diabetes type II Maternal Aunt    No Active Allergies  ROS    Objective:     Ht 5\' 7"  (1.702 m)   Wt 192 lb 6.4 oz (87.3 kg)   BMI 30.13 kg/m    Physical Exam    Assessment & Plan:   Problem List Items Addressed This Visit   None   No follow-ups on file.    Mayers, PA-C

## 2021-12-17 NOTE — Patient Instructions (Signed)
To help with your upper back pain, you will use Flexeril at bedtime as needed.  I encourage you to continue using the ibuprofen as needed, make sure you are drinking lots of water and do some gentle stretching.  I encourage you to avoid sleeping on your left side.  Your blood pressure is elevated, I encourage you to check your blood pressure twice daily, keep a written log and have available for all office visits.  Please make sure to follow-up with the mobile unit in 2 weeks for further evaluation.  Your A1c is 6.8.  I strongly encourage you to follow a low sugar diet.  We will call you with today's lab results.  Roney Jaffe, PA-C Physician Assistant [facility] [email or web address]   Muscle Strain A muscle strain is an injury that occurs when a muscle is stretched beyond its normal length. Usually, a small number of muscle fibers are torn when this happens. There are three types of muscle strains. First-degree strains have the least amount of muscle fiber tearing and the least amount of pain. Second-degree and third-degree strains have more tearing and pain. Usually, recovery from muscle strain takes 1-2 weeks. Complete healing normally takes 5-6 weeks. What are the causes? This condition is caused when a sudden, violent force is placed on a muscle and stretches it too far. This may occur with a fall, while lifting, or during sports. What increases the risk? This condition is more likely to develop in athletes and people who are physically active. What are the signs or symptoms? Symptoms of this condition include: Pain. Tenderness. Bruising. Swelling. Trouble using the muscle. How is this diagnosed? This condition is diagnosed based on a physical exam and your medical history. Tests may also be done, including an X-ray, ultrasound, or MRI. How is this treated? This condition is initially treated with PRICE therapy. This therapy  involves: Protecting the muscle from being injured again. Resting the injured muscle. Icing the injured muscle. Applying pressure (compression) to the injured muscle. This may be done with a splint or elastic bandage. Raising (elevating) the injured muscle. Your health care provider may also recommend medicine for pain. Follow these instructions at home: If you have a removable splint: Wear the splint as told by your health care provider. Remove it only as told by your health care provider. Check the skin around the splint every day. Tell your health care provider about any concerns. Loosen the splint if your fingers or toes tingle, become numb, or turn cold and blue. Keep the splint clean. If the splint is not waterproof: Do not let it get wet. Cover it with a watertight covering when you take a bath or a shower. Managing pain, stiffness, and swelling  If directed, put ice on the injured area. To do this: If you have a removable splint, remove it as told by your health care provider. Put ice in a plastic bag. Place a towel between your skin and the bag. Leave the ice on for 20 minutes, 2-3 times a day. Remove the ice if your skin turns bright red. This is very important. If you cannot feel pain, heat, or cold, you have a greater risk of damage to the area. Move your fingers or toes often to reduce stiffness and swelling. Raise (elevate) the injured area above the level of your heart while you are sitting or lying down. Wear an elastic bandage as told by your health care provider. Make sure that it is not  too tight. General instructions Take over-the-counter and prescription medicines only as told by your health care provider. Treatment may include muscle relaxants or medicines for pain and inflammation that are taken by mouth or applied to the skin. Restrict your activity and rest the injured muscle as told by your health care provider. Gentle movements may be allowed. If physical  therapy was prescribed, do exercises as told by your health care provider. Do not put pressure on any part of the splint until it is fully hardened. This may take several hours. Do not use any products that contain nicotine or tobacco. These products include cigarettes, chewing tobacco, and vaping devices, such as e-cigarettes. If you need help quitting, ask your health care provider. Ask your health care provider when it is safe to drive if you have a splint. Keep all follow-up visits. This is important. How is this prevented? Warm up before exercising. This helps to prevent future muscle strains. Contact a health care provider if: You have more pain or swelling in the injured area. Get help right away if: You have numbness or tingling in the injured area. You lose a lot of strength in the injured area. Summary A muscle strain is an injury that occurs when a muscle is stretched beyond its normal length. This condition is caused when a sudden, violent force is placed on a muscle and stretches it too far. This condition is initially treated with PRICE therapy, which involves protecting, resting, icing, compressing, and elevating. Gentle movements may be allowed. If physical therapy was prescribed, do exercises as told by your health care provider. This information is not intended to replace advice given to you by your health care provider. Make sure you discuss any questions you have with your health care provider. Document Revised: 07/10/2020 Document Reviewed: 07/10/2020 Elsevier Patient Education  2023 [employer].

## 2021-12-18 LAB — CBC WITH DIFFERENTIAL/PLATELET
Basophils Absolute: 0.1 10*3/uL (ref 0.0–0.2)
Basos: 1 %
EOS (ABSOLUTE): 0.8 10*3/uL — ABNORMAL HIGH (ref 0.0–0.4)
Eos: 5 %
Hematocrit: 43.4 % (ref 37.5–51.0)
Hemoglobin: 14 g/dL (ref 13.0–17.7)
Immature Grans (Abs): 0 10*3/uL (ref 0.0–0.1)
Immature Granulocytes: 0 %
Lymphocytes Absolute: 2.3 10*3/uL (ref 0.7–3.1)
Lymphs: 14 %
MCH: 27.6 pg (ref 26.6–33.0)
MCHC: 32.3 g/dL (ref 31.5–35.7)
MCV: 85 fL (ref 79–97)
Monocytes Absolute: 1.1 10*3/uL — ABNORMAL HIGH (ref 0.1–0.9)
Monocytes: 7 %
Neutrophils Absolute: 11.5 10*3/uL — ABNORMAL HIGH (ref 1.4–7.0)
Neutrophils: 73 %
Platelets: 338 10*3/uL (ref 150–450)
RBC: 5.08 x10E6/uL (ref 4.14–5.80)
RDW: 13.3 % (ref 11.6–15.4)
WBC: 15.8 10*3/uL — ABNORMAL HIGH (ref 3.4–10.8)

## 2021-12-18 LAB — PSA: Prostate Specific Ag, Serum: 0.5 ng/mL (ref 0.0–4.0)

## 2021-12-18 LAB — VITAMIN D 25 HYDROXY (VIT D DEFICIENCY, FRACTURES): Vit D, 25-Hydroxy: 20.2 ng/mL — ABNORMAL LOW (ref 30.0–100.0)

## 2021-12-18 MED ORDER — VITAMIN D (ERGOCALCIFEROL) 1.25 MG (50000 UNIT) PO CAPS: 50000.0000 [IU] | ORAL_CAPSULE | ORAL | 2 refills | Status: DC

## 2021-12-18 NOTE — Addendum Note (Signed)
Addended by: Roney Jaffe on: 12/18/2021 01:57 PM   Modules accepted: Orders

## 2022-01-01 ENCOUNTER — Ambulatory Visit: Payer: Commercial Managed Care - HMO | Admitting: Physician Assistant

## 2022-01-01 ENCOUNTER — Other Ambulatory Visit: Payer: Self-pay

## 2022-01-01 ENCOUNTER — Encounter: Payer: Self-pay | Admitting: Physician Assistant

## 2022-01-01 DIAGNOSIS — E119 Type 2 diabetes mellitus without complications: Secondary | ICD-10-CM | POA: Diagnosis not present

## 2022-01-01 DIAGNOSIS — M25512 Pain in left shoulder: Secondary | ICD-10-CM | POA: Diagnosis not present

## 2022-01-01 DIAGNOSIS — E559 Vitamin D deficiency, unspecified: Secondary | ICD-10-CM

## 2022-01-01 DIAGNOSIS — L2082 Flexural eczema: Secondary | ICD-10-CM

## 2022-01-01 DIAGNOSIS — E785 Hyperlipidemia, unspecified: Secondary | ICD-10-CM

## 2022-01-01 DIAGNOSIS — I10 Essential (primary) hypertension: Secondary | ICD-10-CM

## 2022-01-01 DIAGNOSIS — F3341 Major depressive disorder, recurrent, in partial remission: Secondary | ICD-10-CM

## 2022-01-01 MED ORDER — SERTRALINE HCL 50 MG PO TABS
50.0000 mg | ORAL_TABLET | Freq: Every day | ORAL | 1 refills | Status: DC
  Filled 2022-01-01 – 2022-02-06 (×2): qty 30, 30d supply, fill #0

## 2022-01-01 MED ORDER — TRIAMCINOLONE ACETONIDE 0.1 % EX OINT
1.0000 | TOPICAL_OINTMENT | Freq: Two times a day (BID) | CUTANEOUS | 1 refills | Status: DC
  Filled 2022-01-01: qty 80, 30d supply, fill #0
  Filled 2022-02-06: qty 80, 30d supply, fill #1

## 2022-01-01 MED ORDER — GABAPENTIN 300 MG PO CAPS
600.0000 mg | ORAL_CAPSULE | Freq: Every day | ORAL | 1 refills | Status: DC
  Filled 2022-01-01 – 2022-02-06 (×2): qty 60, 30d supply, fill #0

## 2022-01-01 MED ORDER — CYCLOBENZAPRINE HCL 10 MG PO TABS
10.0000 mg | ORAL_TABLET | Freq: Every evening | ORAL | 1 refills | Status: DC | PRN
  Filled 2022-01-01: qty 30, 30d supply, fill #0

## 2022-01-01 MED ORDER — AMLODIPINE BESYLATE 10 MG PO TABS
10.0000 mg | ORAL_TABLET | Freq: Every day | ORAL | 1 refills | Status: DC
  Filled 2022-01-01 – 2022-02-06 (×2): qty 30, 30d supply, fill #0

## 2022-01-01 MED ORDER — VITAMIN D (ERGOCALCIFEROL) 1.25 MG (50000 UNIT) PO CAPS
50000.0000 [IU] | ORAL_CAPSULE | ORAL | 2 refills | Status: DC
  Filled 2022-01-01: qty 4, 28d supply, fill #0

## 2022-01-01 MED ORDER — ATORVASTATIN CALCIUM 20 MG PO TABS
20.0000 mg | ORAL_TABLET | Freq: Every day | ORAL | 1 refills | Status: DC
  Filled 2022-01-01: qty 30, 30d supply, fill #0

## 2022-01-01 MED ORDER — METFORMIN HCL 850 MG PO TABS
850.0000 mg | ORAL_TABLET | Freq: Two times a day (BID) | ORAL | 1 refills | Status: DC
  Filled 2022-01-01: qty 60, 30d supply, fill #0

## 2022-01-01 MED ORDER — HYDROCHLOROTHIAZIDE 25 MG PO TABS
12.5000 mg | ORAL_TABLET | Freq: Every day | ORAL | 1 refills | Status: DC
  Filled 2022-01-01: qty 15, 30d supply, fill #0

## 2022-01-01 NOTE — Patient Instructions (Signed)
We are adding hydrochlorothiazide 12.5 mg to your blood pressure medication regimen.  I encourage you to take this medication in the morning as it may make you urinate a little bit more.  Please continue to check your blood pressure at home, keep a written log and have available for all office visits.  Please return to the mobile unit in 2 weeks for follow-up.  Please plan on fasting for that appointment so we can also check your cholesterol that day.  Roney Jaffe, PA-C Physician Assistant [facility] Medicine [email or web address]   How to Take Your Blood Pressure Blood pressure is a measurement of how strongly your blood is pressing against the walls of your arteries. Arteries are blood vessels that carry blood from your heart throughout your body. Your health care provider takes your blood pressure at each office visit. You can also take your own blood pressure at home with a blood pressure monitor. You may need to take your own blood pressure to: Confirm a diagnosis of high blood pressure (hypertension). Monitor your blood pressure over time. Make sure your blood pressure medicine is working. Supplies needed: Blood pressure monitor. A chair to sit in. This should be a chair where you can sit upright with your back supported. Do not sit on a soft couch or an armchair. Table or desk. Small notebook and pencil or pen. How to prepare To get the most accurate reading, avoid the following for 30 minutes before you check your blood pressure: Drinking caffeine. Drinking alcohol. Eating. Smoking. Exercising. Five minutes before you check your blood pressure: Use the bathroom and urinate so that you have an empty bladder. Sit quietly in a chair. Do not talk. How to take your blood pressure To check your blood pressure, follow the instructions in the manual that came with your blood pressure monitor. If you have a digital blood pressure monitor,  the instructions may be as follows: Sit up straight in a chair. Place your feet on the floor. Do not cross your ankles or legs. Rest your left arm at the level of your heart on a table or desk or on the arm of a chair. Pull up your shirt sleeve. Wrap the blood pressure cuff around the upper part of your left arm, 1 inch (2.5 cm) above your elbow. It is best to wrap the cuff around bare skin. Fit the cuff snugly, but not too tightly, around your arm. You should be able to place only one finger between the cuff and your arm. Position the cord so that it rests in the bend of your elbow. Press the power button. Sit quietly while the cuff inflates and deflates. Read the digital reading on the monitor screen and write the numbers down (record them) in a notebook. Wait 2-3 minutes, then repeat the steps, starting at step 1. What does my blood pressure reading mean? A blood pressure reading consists of a higher number over a lower number. Ideally, your blood pressure should be below 120/80. The first ("top") number is called the systolic pressure. It is a measure of the pressure in your arteries as your heart beats. The second ("bottom") number is called the diastolic pressure. It is a measure of the pressure in your arteries as the heart relaxes. Blood pressure is classified into four stages. The following are the stages for adults who do not have a short-term serious illness or a chronic condition. Systolic pressure and diastolic pressure are measured in a unit called mm Hg (  millimeters of mercury).  Normal Systolic pressure: below 120. Diastolic pressure: below 80. Elevated Systolic pressure: 120-129. Diastolic pressure: below 80. Hypertension stage 1 Systolic pressure: 130-139. Diastolic pressure: 80-89. Hypertension stage 2 Systolic pressure: 140 or above. Diastolic pressure: 90 or above. You can have elevated blood pressure or hypertension even if only the systolic or only the diastolic  number in your reading is higher than normal. Follow these instructions at home: Medicines Take over-the-counter and prescription medicines only as told by your health care provider. Tell your health care provider if you are having any side effects from blood pressure medicine. General instructions Check your blood pressure as often as recommended by your health care provider. Check your blood pressure at the same time every day. Take your monitor to the next appointment with your health care provider to make sure that: You are using it correctly. It provides accurate readings. Understand what your goal blood pressure numbers are. Keep all follow-up visits. This is important. General tips Your health care provider can suggest a reliable monitor that will meet your needs. There are several types of home blood pressure monitors. Choose a monitor that has an arm cuff. Do not choose a monitor that measures your blood pressure from your wrist or finger. Choose a cuff that wraps snugly, not too tight or too loose, around your upper arm. You should be able to fit only one finger between your arm and the cuff. You can buy a blood pressure monitor at most drugstores or online. Where to find more information American Heart Association: www.heart.org Contact a health care provider if: Your blood pressure is consistently high. Your blood pressure is suddenly low. Get help right away if: Your systolic blood pressure is higher than 180. Your diastolic blood pressure is higher than 120. These symptoms may be an emergency. Get help right away. Call 911. Do not wait to see if the symptoms will go away. Do not drive yourself to the hospital. Summary Blood pressure is a measurement of how strongly your blood is pressing against the walls of your arteries. A blood pressure reading consists of a higher number over a lower number. Ideally, your blood pressure should be below 120/80. Check your blood  pressure at the same time every day. Avoid caffeine, alcohol, smoking, and exercise for 30 minutes prior to checking your blood pressure. These agents can affect the accuracy of the blood pressure reading. This information is not intended to replace advice given to you by your health care provider. Make sure you discuss any questions you have with your health care provider. Document Revised: 01/04/2021 Document Reviewed: 01/04/2021 Elsevier Patient Education  2023 [employer].

## 2022-01-01 NOTE — Progress Notes (Unsigned)
Established Patient Office Visit  Subjective   Patient ID: Jeffery Wheeler, male    DOB: 1965-10-03  Age: 56 y.o. MRN: [id number]  No chief complaint on file.      1. Primary hypertension Continue current regimen, blood pressure slightly elevated today, I encourage patient to check blood pressure on a daily basis, follow low-sodium diet, patient to follow-up with mobile unit in 2 weeks.  Red flags given for prompt reevaluation - amLODipine (NORVASC) 10 MG tablet; Take 1 tablet (10 mg total) by mouth daily.  Dispense: 30 tablet; Refill: 1   2. Type 2 diabetes mellitus without complication, without long-term current use of insulin (HCC) A1c 6.8.  Continue current regimen, patient encouraged to follow low sugar diet - metFORMIN (GLUCOPHAGE) 850 MG tablet; Take 1 tablet (850 mg total) by mouth 2 (two) times daily with a meal.  Dispense: 60 tablet; Refill: 1 - POCT glycosylated hemoglobin (Hb A1C)   3. Hyperlipidemia, unspecified hyperlipidemia type Continue current regimen - atorvastatin (LIPITOR) 20 MG tablet; Take 1 tablet (20 mg total) by mouth daily.  Dispense: 30 tablet; Refill: 1   4. Acute pain of left shoulder Continue current regimen - cyclobenzaprine (FLEXERIL) 10 MG tablet; Take 1 tablet (10 mg total) by mouth at bedtime as needed for muscle spasms.  Dispense: 30 tablet; Refill: 1   5. Recurrent major depressive disorder, in partial remission (HCC) Continue current regimen - sertraline (ZOLOFT) 50 MG tablet; Take 1 tablet (50 mg total) by mouth daily.  Dispense: 30 tablet; Refill: 1   6. Vitamin D deficiency   - Vitamin D, 25-hydroxy   7. Alcohol abuse Currently in substance abuse treatment program   8. Cocaine abuse (HCC)     9. Tobacco use disorder     10. Marijuana abuse     11. Flexural eczema Trial Kenalog.  Patient education given on supportive care - triamcinolone ointment (KENALOG) 0.1 %; Apply 1 Application topically 2 (two) times daily.  Dispense:  30 g; Refill: 1   12. Leukocytosis, unspecified type   - CBC with Differential/Platelet   13. Screening PSA (prostate specific antigen)   - PSA     I have reviewed the patient's medical history (PMH, PSH, Social History, Family History, Medications, and allergies) , and have been updated if relevant. I spent 40 minutes reviewing chart and  face to face time with patient.       Return in 2 weeks (on 12/31/2021) for with [facility].      Kasandra Knudsen Mayers, PA-C Please call patient and let him know that his screening for prostate cancer was negative.  His vitamin D levels are below normal limits, he needs to take 50,000 units once a week for the next 12 weeks and have it rechecked at that time.  His white blood cell count has improved, however still elevated, this does appear to be chronic, however his neutrophils have decreased and they are also are still elevated.  We will do further testing when he follows up in the mobile unit in 2 weeks. Vit D sent to pharmacy  Needs kenalog and gaba  Going to AA meetings Has not started vit d Did not get zoloft      {History (Optional):23778}  ROS    Objective:     There were no vitals taken for this visit. {Vitals History (Optional):23777}  Physical Exam   No results found for any visits on 01/01/22.  {Labs (Optional):23779}  The 10-year ASCVD risk score (  Arnett DK, et al., 2019) is: 44.5%    Assessment & Plan:   Problem List Items Addressed This Visit   None   No follow-ups on file.    Kasandra Knudsen Mayers, PA-C

## 2022-01-02 ENCOUNTER — Other Ambulatory Visit (HOSPITAL_COMMUNITY): Payer: Self-pay

## 2022-01-02 ENCOUNTER — Other Ambulatory Visit: Payer: Self-pay

## 2022-01-03 ENCOUNTER — Encounter: Payer: Self-pay | Admitting: Physician Assistant

## 2022-01-23 ENCOUNTER — Other Ambulatory Visit: Payer: Self-pay

## 2022-01-23 ENCOUNTER — Emergency Department (HOSPITAL_COMMUNITY)
Admission: EM | Admit: 2022-01-23 | Discharge: 2022-01-25 | Disposition: A | Discharge status: Other Acute Inpatient Hospital | Payer: Commercial Managed Care - HMO | Attending: Emergency Medicine | Admitting: Emergency Medicine

## 2022-01-23 DIAGNOSIS — Z91040 Latex allergy status: Secondary | ICD-10-CM | POA: Diagnosis not present

## 2022-01-23 DIAGNOSIS — R45851 Suicidal ideations: Secondary | ICD-10-CM | POA: Insufficient documentation

## 2022-01-23 DIAGNOSIS — Z20822 Contact with and (suspected) exposure to covid-19: Secondary | ICD-10-CM | POA: Diagnosis not present

## 2022-01-23 DIAGNOSIS — F1910 Other psychoactive substance abuse, uncomplicated: Secondary | ICD-10-CM | POA: Insufficient documentation

## 2022-01-23 DIAGNOSIS — Z79899 Other long term (current) drug therapy: Secondary | ICD-10-CM | POA: Insufficient documentation

## 2022-01-23 LAB — CBC WITH DIFFERENTIAL/PLATELET
Abs Immature Granulocytes: 0.31 10*3/uL — ABNORMAL HIGH (ref 0.00–0.07)
Basophils Absolute: 0.1 10*3/uL (ref 0.0–0.1)
Basophils Relative: 1 %
Eosinophils Absolute: 0.2 10*3/uL (ref 0.0–0.5)
Eosinophils Relative: 1 %
HCT: 45.6 % (ref 39.0–52.0)
Hemoglobin: 14.7 g/dL (ref 13.0–17.0)
Immature Granulocytes: 1 %
Lymphocytes Relative: 11 %
Lymphs Abs: 2.4 10*3/uL (ref 0.7–4.0)
MCH: 27.3 pg (ref 26.0–34.0)
MCHC: 32.2 g/dL (ref 30.0–36.0)
MCV: 84.6 fL (ref 80.0–100.0)
Monocytes Absolute: 1.8 10*3/uL — ABNORMAL HIGH (ref 0.1–1.0)
Monocytes Relative: 8 %
Neutro Abs: 17.6 10*3/uL — ABNORMAL HIGH (ref 1.7–7.7)
Neutrophils Relative %: 78 %
Platelets: 429 10*3/uL — ABNORMAL HIGH (ref 150–400)
RBC: 5.39 MIL/uL (ref 4.22–5.81)
RDW: 16 % — ABNORMAL HIGH (ref 11.5–15.5)
WBC: 22.3 10*3/uL — ABNORMAL HIGH (ref 4.0–10.5)
nRBC: 0 % (ref 0.0–0.2)

## 2022-01-23 LAB — SALICYLATE LEVEL: Salicylate Lvl: <7 mg/dL — ABNORMAL LOW (ref 7.0–30.0)

## 2022-01-23 LAB — CBG MONITORING, ED: Glucose-Capillary: 139 mg/dL — ABNORMAL HIGH (ref 70–99)

## 2022-01-23 LAB — COMPREHENSIVE METABOLIC PANEL
ALT: 19 U/L (ref 0–44)
AST: 22 U/L (ref 15–41)
Albumin: 3.6 g/dL (ref 3.5–5.0)
Alkaline Phosphatase: 73 U/L (ref 38–126)
Anion gap: 11 (ref 5–15)
BUN: 11 mg/dL (ref 6–20)
CO2: 23 mmol/L (ref 22–32)
Calcium: 9 mg/dL (ref 8.9–10.3)
Chloride: 103 mmol/L (ref 98–111)
Creatinine, Ser: 1.06 mg/dL (ref 0.61–1.24)
GFR, Estimated: >60 mL/min (ref >60)
Glucose, Bld: 123 mg/dL — ABNORMAL HIGH (ref 70–99)
Potassium: 4 mmol/L (ref 3.5–5.1)
Sodium: 137 mmol/L (ref 135–145)
Total Bilirubin: 0.9 mg/dL (ref 0.3–1.2)
Total Protein: 6.5 g/dL (ref 6.5–8.1)

## 2022-01-23 LAB — RAPID URINE DRUG SCREEN, HOSP PERFORMED
Amphetamines: NOT DETECTED
Barbiturates: NOT DETECTED
Benzodiazepines: NOT DETECTED
Cocaine: POSITIVE — AB
Opiates: NOT DETECTED
Tetrahydrocannabinol: NOT DETECTED

## 2022-01-23 LAB — ACETAMINOPHEN LEVEL: Acetaminophen (Tylenol), Serum: <10 ug/mL — ABNORMAL LOW (ref 10–30)

## 2022-01-23 LAB — SARS CORONAVIRUS 2 BY RT PCR: SARS Coronavirus 2 by RT PCR: NEGATIVE

## 2022-01-23 LAB — ETHANOL: Alcohol, Ethyl (B): <10 mg/dL (ref <10)

## 2022-01-23 NOTE — ED Provider Notes (Signed)
[facility] EMERGENCY DEPARTMENT Provider Note   CSN: [id number] Arrival date & time: 01/23/22  0825     History  Chief Complaint  Patient presents with   Suicidal   Alcohol Problem   Addiction Problem    Jeffery Wheeler is a 56 y.o. male.  The history is provided by the patient and medical records. No language interpreter was used.  Alcohol Problem This is a chronic problem. The problem occurs constantly. The problem has not changed since onset.Pertinent negatives include no chest pain, no abdominal pain, no headaches and no shortness of breath. Nothing aggravates the symptoms. Nothing relieves the symptoms. He has tried nothing for the symptoms. The treatment provided no relief.       Home Medications Prior to Admission medications   Medication Sig Start Date End Date Taking? Authorizing Provider  amLODipine (NORVASC) 10 MG tablet Take 1 tablet (10 mg total) by mouth daily. 01/01/22  Yes Mayers, Cari S, PA-C  atorvastatin (LIPITOR) 20 MG tablet Take 1 tablet (20 mg total) by mouth daily. 01/01/22  Yes Mayers, Cari S, PA-C  cyclobenzaprine (FLEXERIL) 10 MG tablet Take 1 tablet (10 mg total) by mouth at bedtime as needed for muscle spasms. 01/01/22  Yes Mayers, Cari S, PA-C  gabapentin (NEURONTIN) 300 MG capsule Take 2 capsules (600 mg total) by mouth at bedtime. 01/01/22  Yes Mayers, Cari S, PA-C  metFORMIN (GLUCOPHAGE) 850 MG tablet Take 1 tablet (850 mg total) by mouth 2 (two) times daily with a meal. 01/01/22  Yes Mayers, Cari S, PA-C  hydrochlorothiazide (HYDRODIURIL) 25 MG tablet Take 0.5 tablets (12.5 mg total) by mouth daily. 01/01/22   Mayers, Cari S, PA-C  sertraline (ZOLOFT) 50 MG tablet Take 1 tablet (50 mg total) by mouth daily. 01/01/22   Mayers, Cari S, PA-C  triamcinolone ointment (KENALOG) 0.1 % Apply 1 Application topically 2 (two) times daily. 01/01/22   Mayers, Cari S, PA-C  Vitamin D, Ergocalciferol, (DRISDOL) 1.25 MG (50000 UNIT) CAPS capsule  Take 1 capsule (50,000 Units total) by mouth every 7 (seven) days. 01/01/22   Mayers, Cari S, PA-C      Allergies    Latex and Shellfish-derived products    Review of Systems   Review of Systems  Constitutional:  Negative for chills, fatigue and fever.  HENT:  Positive for congestion.   Eyes:  Negative for visual disturbance.  Respiratory:  Negative for cough, chest tightness and shortness of breath.   Cardiovascular:  Negative for chest pain.  Gastrointestinal:  Negative for abdominal pain, constipation, diarrhea, nausea and vomiting.  Genitourinary:  Negative for dysuria.  Musculoskeletal:  Negative for back pain, neck pain and neck stiffness.  Skin:  Negative for rash and wound.  Neurological:  Negative for weakness, light-headedness and headaches.  Psychiatric/Behavioral:  Positive for suicidal ideas. Negative for agitation, confusion and self-injury. The patient is nervous/anxious.     Physical Exam Updated Vital Signs BP (!) 150/78 (BP Location: Right Arm)   Pulse 77   Temp 98.2 F (36.8 C) (Oral)   Resp 16   Ht 5\' 7"  (1.702 m)   Wt 87.5 kg   SpO2 99%   BMI 30.23 kg/m  Physical Exam Vitals and nursing note reviewed.  Constitutional:      General: He is not in acute distress.    Appearance: He is well-developed. He is not ill-appearing, toxic-appearing or diaphoretic.  HENT:     Head: Normocephalic and atraumatic.     Nose:  No congestion or rhinorrhea.     Mouth/Throat:     Mouth: Mucous membranes are moist.     Pharynx: No oropharyngeal exudate or posterior oropharyngeal erythema.  Eyes:     Extraocular Movements: Extraocular movements intact.     Conjunctiva/sclera: Conjunctivae normal.     Pupils: Pupils are equal, round, and reactive to light.  Cardiovascular:     Rate and Rhythm: Normal rate and regular rhythm.     Heart sounds: No murmur heard. Pulmonary:     Effort: Pulmonary effort is normal. No respiratory distress.     Breath sounds: Normal breath  sounds. No wheezing, rhonchi or rales.  Chest:     Chest wall: No tenderness.  Abdominal:     General: Abdomen is flat.     Palpations: Abdomen is soft.     Tenderness: There is no abdominal tenderness. There is no right CVA tenderness, left CVA tenderness, guarding or rebound.  Musculoskeletal:        General: No swelling or tenderness.     Cervical back: Neck supple. No tenderness.  Skin:    General: Skin is warm and dry.     Capillary Refill: Capillary refill takes less than 2 seconds.     Findings: No erythema or rash.  Neurological:     General: No focal deficit present.     Mental Status: He is alert.     Sensory: No sensory deficit.     Motor: No weakness.  Psychiatric:        Attention and Perception: Perception normal.        Behavior: Behavior is not agitated.        Thought Content: Thought content includes suicidal ideation. Thought content does not include homicidal ideation. Thought content does not include homicidal or suicidal plan.     ED Results / Procedures / Treatments   Labs (all labs ordered are listed, but only abnormal results are displayed) Labs Reviewed  COMPREHENSIVE METABOLIC PANEL - Abnormal; Notable for the following components:      Result Value   Glucose, Bld 123 (*)    All other components within normal limits  RAPID URINE DRUG SCREEN, HOSP PERFORMED - Abnormal; Notable for the following components:   Cocaine POSITIVE (*)    All other components within normal limits  CBC WITH DIFFERENTIAL/PLATELET - Abnormal; Notable for the following components:   WBC 22.3 (*)    RDW 16.0 (*)    Platelets 429 (*)    Neutro Abs 17.6 (*)    Monocytes Absolute 1.8 (*)    Abs Immature Granulocytes 0.31 (*)    All other components within normal limits  SALICYLATE LEVEL - Abnormal; Notable for the following components:   Salicylate Lvl <7.0 (*)    All other components within normal limits  ACETAMINOPHEN LEVEL - Abnormal; Notable for the following components:    Acetaminophen (Tylenol), Serum <10 (*)    All other components within normal limits  SARS CORONAVIRUS 2 BY RT PCR  ETHANOL    EKG EKG Interpretation  Date/Time:  Wednesday January 23 2022 09:24:07 EDT Ventricular Rate:  82 PR Interval:  134 QRS Duration: 70 QT Interval:  376 QTC Calculation: 439 R Axis:   22 Text Interpretation: Normal ECG When compared with ECG of 02-Dec-2021 10:49, PREVIOUS ECG IS PRESENT when compared to prior, similar appearance. No STEMI Confirmed by Theda Belfast ([id number]) on 01/23/2022 5:45:22 PM  Radiology No results found.  Procedures Procedures    Medications Ordered  in ED Medications - No data to display  ED Course/ Medical Decision Making/ A&P                           Medical Decision Making Amount and/or Complexity of Data Reviewed Labs: ordered.    Marquez Ceesay is a 56 y.o. male with a past medical history significant for hypertension, hyperlipidemia, diabetes, and polysubstance abuse who presents with suicidal ideation.  According to patient, he has had some mild rhinorrhea and congestion recently but otherwise has not had significant chest pain shortness of breath, or cough.  He said that he has been having increased cocaine use and alcohol use and was having suicidal thoughts.  He did not have a plan how he would hurt himself but he says he has tried to overdose in the past.  He is denying other complaints and does not report homicidal ideation.  Does not reporting any hallucinations.  He says he wants help with his suicidal thoughts.  Patient is denying other physical complaints at this time aside from the mild congestion.  On exam, lungs clear and chest nontender.  Abdomen nontender.  Moving all extremities.  No focal neurologic deficits.  Patient is not responding to external stimuli at this time.  Patient had some screening work-up in triage that showed mild leukocytosis but was otherwise reassuring.  Cocaine was positive on UDS  as expected.  He denies any urinary symptoms or chest pain.   We will check for COVID given his URI symptoms, white count, and recent COVID in the community.  Due to his report of previous suicide attempt and his desire to commit suicide with his polysubstance abuse, we will consult TTS for recommendations.  Anticipate follow-up on TTS recommendations.  Given his stability and well appearance for over 12 hours without significant vital sign abnormalities, his reassuring EKG, and reassuring metabolic panel, do feel he is safe for psychiatric management.  He is medically clear once his COVID test returns.         Final Clinical Impression(s) / ED Diagnoses Final diagnoses:  Suicidal ideation  Polysubstance abuse ([place])     Clinical Impression: 1. Suicidal ideation   2. Polysubstance abuse ([place])     Disposition: Awaiting TTS recommendations psychiatric management.  This note was prepared with assistance of Systems analyst. Occasional wrong-word or sound-a-like substitutions may have occurred due to the inherent limitations of voice recognition software.      Lakethia Coppess, Gwenyth Allegra, MD 01/23/22 [phone]

## 2022-01-23 NOTE — ED Triage Notes (Addendum)
Pt. Stated, Im going to kill myself today if I dont get any help today. Im on alcohol and drugs and its consuming my body and Im going to kill myself. I take Cocaine last dose 1 hour ago and I drink anything. Ive been using for the last 2 days not eating and just taking and can not get off.

## 2022-01-23 NOTE — ED Notes (Signed)
Patient is under voluntary commitment at this time with a voluntary paper attached to the clipboard in orange zone.

## 2022-01-23 NOTE — ED Notes (Signed)
The pt is c/o total body cramps  mustard given at present

## 2022-01-23 NOTE — ED Provider Triage Note (Signed)
Emergency Medicine Provider Triage Evaluation Note  Jeffery Wheeler , a 56 y.o. male  was evaluated in triage.  Pt complains of suicidal ideations.  Reports he has been doing drugs for the past 2 days nonstop, has not eaten.  States "I do not want to live anymore ".  Reports prior attempts in the past.  Also endorsing some chest congestion, feels that he is overall weak..  Review of Systems  Positive: Chest pressure Negative: Fever, vomiting, abdominal pain   Physical Exam  BP (!) 146/88 (BP Location: Right Arm)   Pulse 95   Temp 98.2 F (36.8 C) (Oral)   Resp 18   Ht 5\' 7"  (1.702 m)   Wt 87.5 kg   SpO2 96%   BMI 30.23 kg/m  Gen:   Awake, no distress   Resp:  Normal effort  MSK:   Moves extremities without difficulty  Other:    Medical Decision Making  Medically screening exam initiated at 9:26 AM.  Appropriate orders placed.  [name] was informed that the remainder of the evaluation will be completed by another provider, this initial triage assessment does not replace that evaluation, and the importance of remaining in the ED until their evaluation is complete.     Janeece Fitting, PA-C 01/23/22 [phone]

## 2022-01-24 DIAGNOSIS — F1420 Cocaine dependence, uncomplicated: Secondary | ICD-10-CM | POA: Insufficient documentation

## 2022-01-24 DIAGNOSIS — F1494 Cocaine use, unspecified with cocaine-induced mood disorder: Secondary | ICD-10-CM | POA: Insufficient documentation

## 2022-01-24 MED ORDER — METFORMIN HCL 850 MG PO TABS
850.0000 mg | ORAL_TABLET | Freq: Two times a day (BID) | ORAL | Status: DC
  Administered 2022-01-24 (×2): 850 mg via ORAL
  Filled 2022-01-24 (×4): qty 1

## 2022-01-24 MED ORDER — SERTRALINE HCL 50 MG PO TABS
50.0000 mg | ORAL_TABLET | Freq: Every day | ORAL | Status: DC
  Administered 2022-01-24: 50 mg via ORAL
  Filled 2022-01-24: qty 1

## 2022-01-24 MED ORDER — IBUPROFEN 400 MG PO TABS
400.0000 mg | ORAL_TABLET | Freq: Four times a day (QID) | ORAL | Status: DC | PRN
  Administered 2022-01-24: 400 mg via ORAL
  Filled 2022-01-24: qty 1

## 2022-01-24 MED ORDER — ACETAMINOPHEN 325 MG PO TABS: 650.0000 mg | ORAL_TABLET | Freq: Four times a day (QID) | ORAL | Status: DC | PRN

## 2022-01-24 MED ORDER — AMLODIPINE BESYLATE 5 MG PO TABS
10.0000 mg | ORAL_TABLET | Freq: Every day | ORAL | Status: DC
  Administered 2022-01-24: 10 mg via ORAL
  Filled 2022-01-24: qty 2

## 2022-01-24 MED ORDER — ATORVASTATIN CALCIUM 10 MG PO TABS
20.0000 mg | ORAL_TABLET | Freq: Every day | ORAL | Status: DC
  Administered 2022-01-24: 20 mg via ORAL
  Filled 2022-01-24: qty 2

## 2022-01-24 MED ORDER — GABAPENTIN 300 MG PO CAPS
600.0000 mg | ORAL_CAPSULE | Freq: Every day | ORAL | Status: DC
  Administered 2022-01-24: 600 mg via ORAL
  Filled 2022-01-24: qty 2

## 2022-01-24 NOTE — ED Notes (Signed)
Received report from [facility], ready to receive patient in purple zone, room 48.

## 2022-01-24 NOTE — Progress Notes (Signed)
Pt was accepted to [facility] 01/24/22  Pt meets inpatient criteria per Darrol Angel, NP  Attending Physician will be Merrily Brittle, DO   Report can be called to: when the patient is ready please call [phone]  Pt can arrive: BED IS READY  Care Team notified: Darrol Angel, NP, Danny Sprinkle, LCAS, Leota Jacobsen., LPN, Merrily Brittle, DO   [place], LCSWA 01/24/2022 @ 12:12 PM

## 2022-01-24 NOTE — ED Notes (Signed)
Pt using TTS machine in room 14, sitter by room.

## 2022-01-24 NOTE — ED Provider Notes (Signed)
Emergency Medicine Observation Re-evaluation Note  Jeffery Wheeler is a 56 y.o. male, seen on rounds today.  Pt initially presented to the ED for complaints of Suicidal, Alcohol Problem, and Addiction Problem Currently, the patient is resting  Physical Exam  BP (!) 150/78 (BP Location: Right Arm)   Pulse 77   Temp 98.2 F (36.8 C) (Oral)   Resp 16   Ht 5\' 7"  (1.702 m)   Wt 87.5 kg   SpO2 99%   BMI 30.23 kg/m  Physical Exam General: NAD Cardiac: Regular heart rate Lungs: No respiratory distress Psych: Stable  ED Course / MDM  EKG:EKG Interpretation  Date/Time:  Wednesday January 23 2022 09:24:07 EDT Ventricular Rate:  82 PR Interval:  134 QRS Duration: 70 QT Interval:  376 QTC Calculation: 439 R Axis:   22 Text Interpretation: Normal ECG When compared with ECG of 02-Dec-2021 10:49, PREVIOUS ECG IS PRESENT when compared to prior, similar appearance. No STEMI Confirmed by Antony Blackbird [phone]) on 01/23/2022 5:45:22 PM  I have reviewed the labs performed to date as well as medications administered while in observation.    Plan  Current plan is for TTS evaluation. Patient is medically cleared per early EDP provider -he has leukocytosis which is chronic but no fever or signs of infection/sepsis.  Covid is negative.  UDS + cocaine.  Patient is here voluntarily, pending TTS evaluation.    Wyvonnia Dusky, MD 01/24/22 [phone]

## 2022-01-24 NOTE — BH Assessment (Addendum)
Comprehensive Clinical Assessment (CCA) Note  01/24/2022 Jeffery Wheeler [id number]  Disposition:  Per Liborio Nixon, NP, [facility] admission is recommmended  The patient demonstrates the following risk factors for suicide: Chronic risk factors for suicide include: psychiatric disorder of depression, substance use disorder, and previous suicide attempts several . Acute risk factors for suicide include: family or marital conflict, unemployment, and loss (financial, interpersonal, professional). Protective factors for this patient include:  none currently . Considering these factors, the overall suicide risk at this point appears to be high. Patient is not appropriate for outpatient follow up.    GAD-7    Flowsheet Row Office Visit from 01/01/2022 in [facility] Office Visit from 12/05/2020 in [facility] at [facility] Visit from 11/20/2020 in [facility]  Total GAD-7 Score 15 17 7       PHQ2-9    Flowsheet Row ED from 01/23/2022 in [facility] EMERGENCY DEPARTMENT Office Visit from 01/01/2022 in [facility] Office Visit from 12/05/2020 in Primary Care at [facility] Office Visit from 11/20/2020 in [facility] Office Visit from 09/12/2020 in [facility]  PHQ-2 Total Score 4 6 3 1 2   PHQ-9 Total Score 15 16 13 7 5       Flowsheet Row ED from 01/23/2022 in [facility] EMERGENCY DEPARTMENT ED from 12/02/2021 in [facility] ED from 11/23/2021 in [facility] EMERGENCY DEPARTMENT  C-SSRS RISK CATEGORY High Risk No Risk No Risk        Chief Complaint:  Chief Complaint  Patient presents with   Suicidal   Alcohol Problem   Addiction Problem   Visit Diagnosis: 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 Self  What Is the  Reason for Your Visit/Call Today? [facility] seeling help for his cocaine addiction issue.  Patient states that he has a history of being a habitual Felon (Common Law Robbery, B&E, Possession of Cocaine and Possession of stolen goods) and states that he has spent thirty years of his life in prison due to crimes related to his addiction.  Patient states that he was just released from prison in December of last year.  He states that he came out of prison and states that he had a hard time adjusting.  He states that when he is struggling that he uses to self-medicate his symptoms and he states that he was not out of prison long until he relpased.  Patient states that he has experienced some periods of sobriety and states that he [facility] and states that he was clean for nine months.  Patient states that he felt like he had things under control and states that when he completed the program that he got his own place to live and states that he was working.  He states that he feels like he should not have left the program because he states that he just could't manage his life on his own. He also states that he does not want to do anything that will cause him to have to go back to prison.  He states that he recently completed the [facility] program and he was living with his aunt until a few days ago when he relapsed and she kicked him out.  He states that since his relapse that his fiance has also left him.  Patient states that his aunt and his fiance were his only supports.  Patient states that his addiction issues are ruining his life and he states that he cannot take it anymore and if he is going to continueto be an addict that he states that he might as well end his life.  He states that he prior suicide attempts and states that he has been thinking that if he cannot get any help that he would be better off killing himself.  Patient did not identify a plan of how  he would do it.  Patient denies HI/Psychosis.  He states that he has been using cocaine on binges since he was [age] years old.  Patient is unable to say how much he uses other that as much as he can get.  He states that he has been on a three day binge.  Patient states that he does not remember much about his childhood and he is unable to identify if he has experienced any trauma or not in his life.   Patient is oreinted and alert.  Mis mood is depressed and his affect is flat.  His judgment, insight and impulse control are impaired.  His thoughts are organized and his memory is intact.  He does not appear to be responding to any internal stimuli.  His speech is normal in tone and rate and his eye contact is good.   How Long Has This Been Causing You Problems? > than 6 months  What Do You Feel Would Help You the Most Today? Alcohol or Drug Use Treatment; Treatment for Depression or other mood problem   Have You Recently Had Any Thoughts About Hurting Yourself? Yes  Are You Planning to Commit Suicide/Harm Yourself At This time? No   Have you Recently Had Thoughts About Hurting Someone [name]? No  Are You Planning to Harm Someone at This Time? No  Explanation: No data recorded  Have You Used Any Alcohol or Drugs in the Past 24 Hours? Yes  How Long Ago Did You Use Drugs or Alcohol? No data recorded What Did You Use and How Much? undetermined amount of cocaine over the past three days   Do You Currently Have a Therapist/Psychiatrist? No  Name of Therapist/Psychiatrist: No data recorded  Have You Been Recently Discharged From Any Office Practice or Programs? Yes  Explanation of Discharge From Practice/Program: Recently completed treatment at [facility]     CCA Screening Triage Referral Assessment Type of Contact: Tele-Assessment  Telemedicine Service Delivery: Telemedicine service delivery: This service was provided via telemedicine using a 2-way, interactive audio and  video technology  Is this Initial or Reassessment? Initial Assessment  Date Telepsych consult ordered in [facility]:  01/23/22  Time Telepsych consult ordered in [facility]:  2032  Location of Assessment: [facility]  Provider Location: [facility] Assessment Services   Collateral Involvement: no collateral information available   Does Patient Have a Court Appointed Legal Guardian? No  Legal Guardian Contact Information: No data recorded Copy of Legal Guardianship Form: No data recorded Legal Guardian Notified of Arrival: No data recorded Legal Guardian Notified of Pending Discharge: No data recorded If Minor and Not Living with Parent(s), Who has Custody? No data recorded Is CPS involved or ever been involved? Never  Is APS involved or ever been involved? Never   Patient Determined To Be At Risk for Harm To Self or Others Based on Review  of Patient Reported Information or Presenting Complaint? Yes, for Self-Harm  Method: No data recorded Availability of Means: No data recorded Intent: No data recorded Notification Required: No data recorded Additional Information for Danger to Others Potential: No data recorded Additional Comments for Danger to Others Potential: No data recorded Are There Guns or Other Weapons in Your Home? No data recorded Types of Guns/Weapons: No data recorded Are These Weapons Safely Secured?                            No data recorded Who Could Verify You Are Able To Have These Secured: No data recorded Do You Have any Outstanding Charges, Pending Court Dates, Parole/Probation? No data recorded Contacted To Inform of Risk of Harm To Self or Others: Unable to Contact:    Does Patient Present under Involuntary Commitment? No  IVC Papers Initial File Date: No data recorded  [place]   Patient Currently Receiving the Following Services: Not Receiving Services   Determination of Need: No data recorded  Options For Referral: [facility]; Facility[facility]Based Crisis     CCA Biopsychosocial Patient Reported Schizophrenia/Schizoaffective Diagnosis in Past: No   Strengths: Desire to change his life   Mental Health Symptoms Depression:   Change in energy/activity; Sleep (too much or little); Worthlessness; Hopelessness   Duration of Depressive symptoms:  Duration of Depressive Symptoms: Greater than two weeks   Mania:   None   Anxiety:    Difficulty concentrating; Sleep; Tension; Worrying; Restlessness   Psychosis:   None   Duration of Psychotic symptoms:    Trauma:   None   Obsessions:   None   Compulsions:   None   Inattention:   None   Hyperactivity/Impulsivity:   None   Oppositional/Defiant Behaviors:   None   Emotional Irregularity:   Potentially harmful impulsivity; Recurrent suicidal behaviors/gestures/threats   Other Mood/Personality Symptoms:   depressed mood, tearful, flat affect    Mental Status Exam Appearance and self-care  Stature:   Average   Weight:   Average weight   Clothing:   Neat/clean   Grooming:   Normal   Cosmetic use:   None   Posture/gait:   Normal   Motor activity:   Restless   Sensorium  Attention:   Normal   Concentration:   Anxiety interferes   Orientation:   Object; Person; Place; Situation; Time   Recall/memory:   Normal   Affect and Mood  Affect:   Flat   Mood:   Depressed; Anxious; Hopeless   Relating  Eye contact:   Normal   Facial expression:   Depressed   Attitude toward examiner:   Cooperative   Thought and Language  Speech flow:  Normal   Thought content:   Appropriate to Mood and Circumstances   Preoccupation:   Suicide   Hallucinations:   None   Organization:  No data recorded  [employer] ServicesExecutive Functions  Fund of Knowledge:   Fair   Intelligence:   Average   Abstraction:   Normal   Judgement:   Fair   Dance movement psychotherapisteality Testing:   Realistic   Insight:   Lacking    Decision Making:   Impulsive   Social Functioning  Social Maturity:   Isolates   Social Judgement:   "Street Smart"   Stress  Stressors:   Grief/losses; Housing; MetallurgistLegal; Financial; Relationship   Coping Ability:   Exhausted   Skill Deficits:  Decision making   Supports:   Family     Religion: Religion/Spirituality Are You A Religious Person?:  (not assessed)  Leisure/Recreation: Leisure / Recreation Do You Have Hobbies?: No  Exercise/Diet: Exercise/Diet Do You Exercise?: No Have You Gained or Lost A Significant Amount of Weight in the Past Six Months?: No Do You Have Any Trouble Sleeping?: Yes Explanation of Sleeping Difficulties: only sleeps a few hours each night   CCA Employment/Education Employment/Work Situation: Employment / Work Situation Employment Situation: Unemployed Patient's Job has Been Impacted by Current Illness: Yes Describe how Patient's Job has Been Impacted: unable to maintain employment when he is abusing drugs Has Patient ever Been in the [employer]?: No  Education: Education Is Patient Currently Attending School?: No Did You Nutritional therapist?: No Did You Have An Individualized Education Program (IIEP): No Patient's Education Has Been Impacted by Current Illness: No   CCA Family/Childhood History Family and Relationship History: Family history Marital status: Single Does patient have children?: No  Childhood History:  Childhood History By whom was/is the patient raised?: Mother Did patient suffer any verbal/emotional/physical/sexual abuse as a child?: No Did patient suffer from severe childhood neglect?: No Has patient ever been sexually abused/assaulted/raped as an adolescent or adult?: No Was the patient ever a victim of a crime or a disaster?: No Witnessed domestic violence?: No Has patient been affected by domestic violence as an adult?: No  Child/Adolescent Assessment:     CCA Substance Use Alcohol/Drug  Use: Alcohol / Drug Use Pain Medications: see MAR Prescriptions: see MAR Over the Counter: see MAR History of alcohol / drug use?: Yes Longest period of sobriety (when/how long): 9 mths Negative Consequences of Use: Financial, Legal, Personal relationships, Work / School Withdrawal Symptoms: None Substance #1 Name of Substance 1: cocaine 1 - Age of First Use: 19 1 - Amount (size/oz): :as much as I can get: 1 - Frequency: uses on binges 1 - Duration: on-going since onset 1 - Last Use / Amount: yesterday, amount unknown 1 - Method of Aquiring: off the street 1- Route of Use: smoke Substance #2 Name of Substance 2: alcohol 2 - Age of First Use: not assessed 2 - Amount (size/oz): few drinks 2 - Frequency: few times a week 2 - Duration: on-going 2 - Last Use / Amount: yesterday, amount not assessed 2 - Method of Aquiring: stores 2 - Route of Substance Use: oral                     ASAM's:  Six Dimensions of Multidimensional Assessment  Dimension 1:  Acute Intoxication and/or Withdrawal Potential:   Dimension 1:  Description of individual's past and current experiences of substance use and withdrawal: Patient is not drinking at a level that would require any detox and he is not exhibiting any withdrawal symptoms  Dimension 2:  Biomedical Conditions and Complications:   Dimension 2:  Description of patient's biomedical conditions and  complications: Patient has no current medical conditions complicated by his use of cocaine  Dimension 3:  Emotional, Behavioral, or Cognitive Conditions and Complications:  Dimension 3:  Description of emotional, behavioral, or cognitive conditions and complications: Patient is having suicidal thought that will put an end to his addiction and states that he has previous suicide attempts.  Dimension 4:  Readiness to Change:  Dimension 4:  Description of Readiness to Change criteria: Patient states that he wants to change his life and not use any  alcohol or drugs again  Dimension  5:  Relapse, Continued use, or Continued Problem Potential:  Dimension 5:  Relapse, continued use, or continued problem potential critiera description: Patient has a history of chronic relapses  Dimension 6:  Recovery/Living Environment:  Dimension 6:  Recovery/Iiving environment criteria description: Patient is homeleaa nad has minimal support  ASAM Severity Score: ASAM's Severity Rating Score: 11  ASAM Recommended Level of Treatment: ASAM Recommended Level of Treatment: Level III Residential Treatment   Substance use Disorder (SUD) Substance Use Disorder (SUD)  Checklist Symptoms of Substance Use: Continued use despite having a persistent/recurrent physical/psychological problem caused/exacerbated by use, Continued use despite persistent or recurrent social, interpersonal problems, caused or exacerbated by use, Large amounts of time spent to obtain, use or recover from the substance(s), Evidence of tolerance, Persistent desire or unsuccessful efforts to cut down or control use, Presence of craving or strong urge to use, Recurrent use that results in a failure to fulfill major role obligations (work, school, home)  Recommendations for Services/Supports/Treatments: Recommendations for Services/Supports/Treatments Recommendations For Services/Supports/Treatments: Residential-Level 3  Discharge Disposition:    DSM5 Diagnoses: Patient Active Problem List   Diagnosis Date Noted   Cocaine-induced mood disorder (HCC)    Cocaine use disorder, severe, dependence (HCC)    Increased urinary frequency 11/20/2020   Nocturia more than twice per night 11/20/2020   Incomplete emptying of bladder 11/20/2020   Psychophysiological insomnia 11/20/2020   Generalized anxiety disorder 11/20/2020   Alcohol abuse 11/20/2020   Substance abuse (HCC) 11/20/2020   HLD (hyperlipidemia) 09/13/2020   HTN (hypertension) 09/13/2020   Type 2 diabetes mellitus (HCC) 09/13/2020   Back  pain 09/13/2020   Eczema 09/13/2020   Tobacco use disorder 09/13/2020     Referrals to Alternative Service(s): Referred to Alternative Service(s):   Place:   Date:   Time:    Referred to Alternative Service(s):   Place:   Date:   Time:    Referred to Alternative Service(s):   Place:   Date:   Time:    Referred to Alternative Service(s):   Place:   Date:   Time:     Effrey Davidow J Deshanti Adcox, LCAS

## 2022-01-24 NOTE — BH Assessment (Signed)
TTS clinician attempted to complete assessment. Per Gerald Stabs, RN, patient is asleep, unable to arouse. TTS will attempt at later time.

## 2022-01-24 NOTE — ED Notes (Signed)
Patient is going to [employer] [facility].  [place]

## 2022-01-25 ENCOUNTER — Ambulatory Visit: Payer: Commercial Managed Care - HMO | Admitting: Nurse Practitioner

## 2022-01-25 ENCOUNTER — Other Ambulatory Visit (HOSPITAL_COMMUNITY)
Admission: EM | Admit: 2022-01-25 | Discharge: 2022-01-27 | Disposition: A | Discharge status: Home / Self Care | Payer: Commercial Managed Care - HMO | Attending: Psychiatry | Admitting: Psychiatry

## 2022-01-25 ENCOUNTER — Encounter (HOSPITAL_COMMUNITY): Payer: Self-pay

## 2022-01-25 ENCOUNTER — Other Ambulatory Visit: Payer: Self-pay

## 2022-01-25 DIAGNOSIS — F1994 Other psychoactive substance use, unspecified with psychoactive substance-induced mood disorder: Secondary | ICD-10-CM | POA: Diagnosis not present

## 2022-01-25 DIAGNOSIS — F332 Major depressive disorder, recurrent severe without psychotic features: Secondary | ICD-10-CM | POA: Diagnosis not present

## 2022-01-25 DIAGNOSIS — F1490 Cocaine use, unspecified, uncomplicated: Secondary | ICD-10-CM

## 2022-01-25 DIAGNOSIS — F1910 Other psychoactive substance abuse, uncomplicated: Secondary | ICD-10-CM | POA: Diagnosis present

## 2022-01-25 MED ORDER — NAPROXEN 500 MG PO TABS: 500.0000 mg | ORAL_TABLET | Freq: Two times a day (BID) | ORAL | Status: DC | PRN

## 2022-01-25 MED ORDER — LORAZEPAM 1 MG PO TABS: 1.0000 mg | ORAL_TABLET | Freq: Every day | ORAL | Status: DC

## 2022-01-25 MED ORDER — METFORMIN HCL 850 MG PO TABS
850.0000 mg | ORAL_TABLET | Freq: Two times a day (BID) | ORAL | Status: DC
  Administered 2022-01-25 – 2022-01-27 (×4): 850 mg via ORAL
  Filled 2022-01-25 (×4): qty 1

## 2022-01-25 MED ORDER — LORAZEPAM 1 MG PO TABS: 1.0000 mg | ORAL_TABLET | Freq: Three times a day (TID) | ORAL | Status: DC

## 2022-01-25 MED ORDER — TRAZODONE HCL 50 MG PO TABS: 50.0000 mg | ORAL_TABLET | Freq: Every evening | ORAL | Status: DC | PRN

## 2022-01-25 MED ORDER — ONDANSETRON 4 MG PO TBDP: 4.0000 mg | ORAL_TABLET | Freq: Four times a day (QID) | ORAL | Status: DC | PRN

## 2022-01-25 MED ORDER — THIAMINE MONONITRATE 100 MG PO TABS: 100.0000 mg | ORAL_TABLET | Freq: Every day | ORAL | Status: DC

## 2022-01-25 MED ORDER — LOPERAMIDE HCL 2 MG PO CAPS: 2.0000 mg | ORAL_CAPSULE | ORAL | Status: DC | PRN

## 2022-01-25 MED ORDER — SERTRALINE HCL 50 MG PO TABS
50.0000 mg | ORAL_TABLET | Freq: Every day | ORAL | Status: DC
  Administered 2022-01-25 – 2022-01-27 (×3): 50 mg via ORAL
  Filled 2022-01-25 (×3): qty 1

## 2022-01-25 MED ORDER — HYDROXYZINE HCL 25 MG PO TABS: 25.0000 mg | ORAL_TABLET | Freq: Four times a day (QID) | ORAL | Status: DC | PRN

## 2022-01-25 MED ORDER — MELATONIN 5 MG PO TABS
5.0000 mg | ORAL_TABLET | Freq: Every evening | ORAL | Status: DC | PRN
  Administered 2022-01-26: 5 mg via ORAL
  Filled 2022-01-25: qty 1

## 2022-01-25 MED ORDER — LORAZEPAM 1 MG PO TABS: 1.0000 mg | ORAL_TABLET | Freq: Two times a day (BID) | ORAL | Status: DC

## 2022-01-25 MED ORDER — ADULT MULTIVITAMIN W/MINERALS CH
1.0000 | ORAL_TABLET | Freq: Every day | ORAL | Status: DC
  Administered 2022-01-25 – 2022-01-27 (×3): 1 via ORAL
  Filled 2022-01-25 (×3): qty 1

## 2022-01-25 MED ORDER — DICYCLOMINE HCL 20 MG PO TABS: 20.0000 mg | ORAL_TABLET | Freq: Four times a day (QID) | ORAL | Status: DC | PRN

## 2022-01-25 MED ORDER — ALUM & MAG HYDROXIDE-SIMETH 200-200-20 MG/5ML PO SUSP: 30.0000 mL | ORAL | Status: DC | PRN

## 2022-01-25 MED ORDER — LORAZEPAM 1 MG PO TABS: 1.0000 mg | ORAL_TABLET | Freq: Four times a day (QID) | ORAL | Status: DC | PRN

## 2022-01-25 MED ORDER — LORAZEPAM 1 MG PO TABS: 1.0000 mg | ORAL_TABLET | Freq: Four times a day (QID) | ORAL | Status: DC

## 2022-01-25 MED ORDER — IBUPROFEN 200 MG PO TABS
200.0000 mg | ORAL_TABLET | Freq: Four times a day (QID) | ORAL | Status: DC | PRN
  Administered 2022-01-25: 200 mg via ORAL
  Filled 2022-01-25: qty 1

## 2022-01-25 MED ORDER — GABAPENTIN 300 MG PO CAPS
600.0000 mg | ORAL_CAPSULE | Freq: Every day | ORAL | Status: DC
  Administered 2022-01-25 – 2022-01-26 (×2): 600 mg via ORAL
  Filled 2022-01-25 (×2): qty 2

## 2022-01-25 MED ORDER — ATORVASTATIN CALCIUM 10 MG PO TABS
20.0000 mg | ORAL_TABLET | Freq: Every day | ORAL | Status: DC
  Administered 2022-01-25 – 2022-01-27 (×3): 20 mg via ORAL
  Filled 2022-01-25 (×3): qty 2

## 2022-01-25 MED ORDER — THIAMINE HCL 100 MG/ML IJ SOLN: 100.0000 mg | Freq: Once | INTRAMUSCULAR | Status: DC

## 2022-01-25 MED ORDER — METHOCARBAMOL 500 MG PO TABS: 500.0000 mg | ORAL_TABLET | Freq: Three times a day (TID) | ORAL | Status: DC | PRN

## 2022-01-25 MED ORDER — AMLODIPINE BESYLATE 10 MG PO TABS
10.0000 mg | ORAL_TABLET | Freq: Every day | ORAL | Status: DC
  Administered 2022-01-25 – 2022-01-27 (×3): 10 mg via ORAL
  Filled 2022-01-25 (×3): qty 1

## 2022-01-25 MED ORDER — MAGNESIUM HYDROXIDE 400 MG/5ML PO SUSP: 30.0000 mL | Freq: Every day | ORAL | Status: DC | PRN

## 2022-01-25 MED ORDER — ACETAMINOPHEN 325 MG PO TABS: 650.0000 mg | ORAL_TABLET | Freq: Four times a day (QID) | ORAL | Status: DC | PRN

## 2022-01-25 NOTE — ED Provider Notes (Signed)
[facility] Continuous Assessment Admission H&P  Date: 01/25/22 Patient Name: Jeffery Wheeler MRN: [id number] Chief Complaint: "I ain't feeling right".     Diagnoses:  Final diagnoses:  Substance induced mood disorder (HCC)  Severe episode of recurrent major depressive disorder, without psychotic features (HCC)  Polysubstance abuse (HCC)  Crack cocaine use    HPI: Really a hemiparesis is a 56 year old male with psychiatric history of MDD, alcohol intoxication without complication, alcohol use disorder severe dependence, polysubstance abuse, and GAD, presents to the [facility] as a transfer from [facility] where he initially presented on 01/23/22 seeking treatment for depression and substance abuse.    Patient was evaluated face to face by this provider is very irritable and angry.  Patient initially refused to engage, and stated "it is hard to talk about it now, I need some rest, I need sleep".  Patient endorses suicidal ideation with a plan to die "suicide by cop".  Patient reports "I've has been feeling this way off and on for the past month or so".  Patient denies HI, denies AVH.  Patient reports history of depression and endorsed relapse on track cocaine as his main triggers.    Per chart review "patient reports spending about 30 years in prison for crimes related to his addiction and was just released from prison in December of last year, but is having a hard time adjusting and uses crack cocaine to self-medicate his symptoms and relapsed not long after he came out of prison.  Patient states that he has experienced some periods of sobriety after he completed the [facility] and states that he was clean for nine months".  Patient reports he started smoking crack cocaine in the 80s and currently smokes daily "if I can get it".  Patient reports he last smoked before he went to [facility].  Patient denies other illicit drug use. Patient endorses drinking about 40-oz of beer daily.  Patient  reports he last drank yesterday.  BAC levels reviewed and are within normal limits.  Patient denies history of alcohol induced seizures.  Patient reports he lives with his elderly aunt, but plans on moving out soon.  Patient reports he is currently unemployed.  Patient denies presence of guns or weapons at home.  Patient reports his sleep is poor and appetite as good.  Patient recalls being hospitalized in [facility] sometime this year for a suicide attempt.  Patient reports he is currently on psychiatric medications prescribed at [facility] where he has monthly appointments.  Patient reports he gets his medications through the mobile bus.  Patient reports he takes a lot of medications and is unable to recall the names at this time.  Support and encouragement and reassurance provided about ongoing stressors.  Patient provided with opportunity for questions.  On evaluation, patient is alert, oriented 3, irritable and minimally cooperative. Speech is clear and coherent. Pt appears casual. Eye contact is fair. Mood is depressed and irritable, affect is congruent with mood. Thought process and thought content is coherent. Pt endorses active SI with plan, denies HI/AVH. There is no indication that the patient is responding to internal stimuli. No delusions elicited during this assessment.     PHQ 2-9:  Flowsheet Row ED from 01/23/2022 in [facility] EMERGENCY DEPARTMENT Office Visit from 01/01/2022 in [facility] Office Visit from 12/05/2020 in Primary Care at [facility]  Thoughts that you would be better off dead, or of hurting yourself in some way More than  half the days Not at all Not at all  PHQ-9 Total Score 15 16 13        Flowsheet Row ED from 01/23/2022 in [facility] EMERGENCY DEPARTMENT ED from 12/02/2021 in [facility]ENCY DEPART[facility] ED from 11/23/2021 in [facility] EMERGENCY DEPARTMENT  C-SSRS RISK CATEGORY High Risk No  Risk No Risk        Total Time spent with patient: 20 minutes  Musculoskeletal  Strength & Muscle Tone: within normal limits Gait & Station: normal Patient leans: N/A  Psychiatric Specialty Exam  Presentation General Appearance: Casual  Eye Contact:Fair  Speech:Clear and Coherent  Speech Volume:Normal  Handedness:Right   Mood and Affect  Mood:Depressed; Irritable  Affect:Congruent   Thought Process  Thought Processes:Coherent  Descriptions of Associations:Intact  Orientation:Full (Time, Place and Person)  Thought Content:WDL  Diagnosis of Schizophrenia or Schizoaffective disorder in past: No   Hallucinations:Hallucinations: None  Ideas of Reference:None  Suicidal Thoughts:Suicidal Thoughts: Yes, Active SI Active Intent and/or Plan: With Plan  Homicidal Thoughts:Homicidal Thoughts: No   Sensorium  Memory:Immediate Fair  Judgment:Poor  Insight:Poor   Executive Functions  Concentration:Fair  Attention Span:Fair  Recall:Fair  Fund of Knowledge:Fair  Language:Fair   Psychomotor Activity  Psychomotor Activity:Psychomotor Activity: Normal   Assets  Assets:Communication Skills; Desire for Improvement   Sleep  Sleep:Sleep: Poor   Nutritional Assessment (For OBS and FBC admissions only) Has the patient had a weight loss or gain of 10 pounds or more in the last 3 months?: No Has the patient had a decrease in food intake/or appetite?: No Does the patient have dental problems?: No Does the patient have eating habits or behaviors that may be indicators of an eating disorder including binging or inducing vomiting?: No Has the patient recently lost weight without trying?: 0 Has the patient been eating poorly because of a decreased appetite?: 0 Malnutrition Screening Tool Score: 0    Physical Exam Constitutional:      General: He is not in acute distress.    Appearance: He is not diaphoretic.  HENT:     Head: Normocephalic.     Right  Ear: External ear normal.     Left Ear: External ear normal.     Nose: No congestion.  Eyes:     General:        Right eye: No discharge.        Left eye: No discharge.  Cardiovascular:     Rate and Rhythm: Normal rate.  Pulmonary:     Effort: No respiratory distress.  Chest:     Chest wall: No tenderness.  Neurological:     Mental Status: He is alert. Mental status is at baseline.  Psychiatric:        Attention and Perception: Attention and perception normal.        Mood and Affect: Mood is depressed. Affect is angry.        Speech: Speech normal.        Behavior: Behavior is cooperative.        Thought Content: Thought content is not paranoid or delusional. Thought content includes suicidal ideation. Thought content does not include homicidal ideation. Thought content includes suicidal plan. Thought content does not include homicidal plan.        Cognition and Memory: Cognition and memory normal.        Judgment: Judgment is impulsive.    Review of Systems  Constitutional:  Negative for chills, diaphoresis and fever.  HENT:  Negative for congestion.   Eyes:  Negative for discharge.  Respiratory:  Negative for cough, shortness of breath and wheezing.   Cardiovascular:  Negative for chest pain and palpitations.  Gastrointestinal:  Negative for diarrhea, nausea and vomiting.  Neurological:  Negative for dizziness, seizures, loss of consciousness, weakness and headaches.  Psychiatric/Behavioral:  Positive for depression, substance abuse and suicidal ideas.     Blood pressure (!) 156/90, pulse 80, temperature 98.5 F (36.9 C), temperature source Tympanic, resp. rate 18, SpO2 98 %. There is no height or weight on file to calculate BMI.  Past Psychiatric History: See H & P   Is the patient at risk to self? Yes  Has the patient been a risk to self in the past 6 months? Yes .    Has the patient been a risk to self within the distant past? Yes   Is the patient a risk to others? No    Has the patient been a risk to others in the past 6 months? No   Has the patient been a risk to others within the distant past? No   Past Medical History:  Past Medical History:  Diagnosis Date   Eczema    HLD (hyperlipidemia)    HTN (hypertension)    Type 2 diabetes mellitus ([place])     Past Surgical History:  Procedure Laterality Date   CERVICAL SPINE SURGERY  2018   right shoulder  1991    Family History:  Family History  Problem Relation Age of Onset   Diabetes type II Sister    Diabetes type II Paternal Grandmother    Diabetes type II Maternal Aunt     Social History:  Social History   Socioeconomic History   Marital status: Single    Spouse name: Not on file   Number of children: Not on file   Years of education: Not on file   Highest education level: Not on file  Occupational History   Not on file  Tobacco Use   Smoking status: Never   Smokeless tobacco: Never  Substance and Sexual Activity   Alcohol use: Not Currently   Drug use: Not Currently   Sexual activity: Yes  Other Topics Concern   Not on file  Social History Narrative   ** Merged History Encounter **       Social Determinants of Health   Financial Resource Strain: Not on file  Food Insecurity: Not on file  Transportation Needs: Not on file  Physical Activity: Not on file  Stress: Not on file  Social Connections: Not on file  Intimate Partner Violence: Not on file    SDOH:  SDOH Screenings   Depression (PHQ2-9): High Risk (01/24/2022)  Tobacco Use: Low Risk  (01/03/2022)  Recent Concern: Tobacco Use - High Risk (10/11/2021)    Last Labs:  Admission on 01/23/2022, Discharged on 01/25/2022  Component Date Value Ref Range Status   Sodium 01/23/2022 137  135 - 145 mmol/L Final   Potassium 01/23/2022 4.0  3.5 - 5.1 mmol/L Final   Chloride 01/23/2022 103  98 - 111 mmol/L Final   CO2 01/23/2022 23  22 - 32 mmol/L Final   Glucose, Bld 01/23/2022 123 (H)  70 - 99 mg/dL Final   Glucose  reference range applies only to samples taken after fasting for at least 8 hours.   BUN 01/23/2022 11  6 - 20 mg/dL Final   Creatinine, Ser 01/23/2022 1.06  0.61 - 1.24 mg/dL Final  Calcium 01/23/2022 9.0  8.9 - 10.3 mg/dL Final   Total Protein 40/34/742509/20/2023 6.5  6.5 - 8.1 g/dL Final   Albumin 95/63/875609/20/2023 3.6  3.5 - 5.0 g/dL Final   AST 43/32/951809/20/2023 22  15 - 41 U/L Final   ALT 01/23/2022 19  0 - 44 U/L Final   Alkaline Phosphatase 01/23/2022 73  38 - 126 U/L Final   Total Bilirubin 01/23/2022 0.9  0.3 - 1.2 mg/dL Final   GFR, Estimated 01/23/2022 >60  >60 mL/min Final   Comment: (NOTE) Calculated using the CKD-EPI Creatinine Equation (2021)    Anion gap 01/23/2022 11  5 - 15 Final   Performed at [facility], [address] [place], [place] [address]   Opiates 01/23/2022 NONE DETECTED  NONE DETECTED Final   Cocaine 01/23/2022 POSITIVE (A)  NONE DETECTED Final   Benzodiazepines 01/23/2022 NONE DETECTED  NONE DETECTED Final   Amphetamines 01/23/2022 NONE DETECTED  NONE DETECTED Final   Tetrahydrocannabinol 01/23/2022 NONE DETECTED  NONE DETECTED Final   Barbiturates 01/23/2022 NONE DETECTED  NONE DETECTED Final   Comment: (NOTE) DRUG SCREEN FOR MEDICAL PURPOSES ONLY.  IF CONFIRMATION IS NEEDED FOR ANY PURPOSE, NOTIFY LAB WITHIN 5 DAYS.  LOWEST DETECTABLE LIMITS FOR URINE DRUG SCREEN Drug Class                     Cutoff (ng/mL) Amphetamine and metabolites    1000 Barbiturate and metabolites    200 Benzodiazepine                 200 Tricyclics and metabolites     300 Opiates and metabolites        300 Cocaine and metabolites        300 THC                            50 Performed at [facility], [address] [place], [place] [address]    WBC 01/23/2022 22.3 (H)  4.0 - 10.5 K/uL Final   RBC 01/23/2022 5.39  4.22 - 5.81 MIL/uL Final   Hemoglobin 01/23/2022 14.7  13.0 - 17.0 g/dL Final   HCT 60/10/932309/20/2023 45.6  39.0 - 52.0 % Final   MCV 01/23/2022 84.6  80.0 - 100.0 fL  Final   MCH 01/23/2022 27.3  26.0 - 34.0 pg Final   MCHC 01/23/2022 32.2  30.0 - 36.0 g/dL Final   RDW 55/73/220209/20/2023 16.0 (H)  11.5 - 15.5 % Final   Platelets 01/23/2022 429 (H)  150 - 400 K/uL Final   nRBC 01/23/2022 0.0  0.0 - 0.2 % Final   Neutrophils Relative % 01/23/2022 78  % Final   Neutro Abs 01/23/2022 17.6 (H)  1.7 - 7.7 K/uL Final   Lymphocytes Relative 01/23/2022 11  % Final   Lymphs Abs 01/23/2022 2.4  0.7 - 4.0 K/uL Final   Monocytes Relative 01/23/2022 8  % Final   Monocytes Absolute 01/23/2022 1.8 (H)  0.1 - 1.0 K/uL Final   Eosinophils Relative 01/23/2022 1  % Final   Eosinophils Absolute 01/23/2022 0.2  0.0 - 0.5 K/uL Final   Basophils Relative 01/23/2022 1  % Final   Basophils Absolute 01/23/2022 0.1  0.0 - 0.1 K/uL Final   Immature Granulocytes 01/23/2022 1  % Final   Abs Immature Granulocytes 01/23/2022 0.31 (H)  0.00 - 0.07 K/uL Final   Performed at [facility], [address] [place], [place] [address]   Alcohol, Ethyl (B) 01/23/2022 <10  <10 mg/dL Final   Comment: (NOTE) Lowest detectable limit for serum alcohol is 10 mg/dL.  For medical purposes only. Performed at [facility], [address] [place], [place] [address]    Salicylate Lvl 01/23/2022 <7.0 (L)  7.0 - 30.0 mg/dL Final   Performed at [facility], [address] [place], [place] [address]   Acetaminophen (Tylenol), Serum 01/23/2022 <10 (L)  10 - 30 ug/mL Final   Comment: (NOTE) Therapeutic concentrations vary significantly. A range of 10-30 ug/mL  may be an effective concentration for many patients. However, some  are best treated at concentrations outside of this range. Acetaminophen concentrations >150 ug/mL at 4 hours after ingestion  and >50 ug/mL at 12 hours after ingestion are often associated with  toxic reactions.  Performed at [facility], [address] [place], [place] [address]    SARS Coronavirus 2 by RT PCR 01/23/2022 NEGATIVE  NEGATIVE Final    Comment: (NOTE) SARS-CoV-2 target nucleic acids are NOT DETECTED.  The SARS-CoV-2 RNA is generally detectable in upper and lower respiratory specimens during the acute phase of infection. The lowest concentration of SARS-CoV-2 viral copies this assay can detect is 250 copies / mL. A negative result does not preclude SARS-CoV-2 infection and should not be used as the sole basis for treatment or other patient management decisions.  A negative result may occur with improper specimen collection / handling, submission of specimen other than nasopharyngeal swab, presence of viral mutation(s) within the areas targeted by this assay, and inadequate number of viral copies (<250 copies / mL). A negative result must be combined with clinical observations, patient history, and epidemiological information.  Fact Sheet for Patients:   RoadLapTop.co.za  Fact Sheet for Healthcare Providers: http://kim-miller.com/  This test is not yet approved or                           cleared by the [place] FDA and has been authorized for detection and/or diagnosis of SARS-CoV-2 by FDA under an Emergency Use Authorization (EUA).  This EUA will remain in effect (meaning this test can be used) for the duration of the COVID-19 declaration under Section 564(b)(1) of the Act, 21 U.S.C. section 360bbb-3(b)(1), unless the authorization is terminated or revoked sooner.  Performed at [facility], [address], [place], [place] [address]    Glucose-Capillary 01/23/2022 139 (H)  70 - 99 mg/dL Final   Glucose reference range applies only to samples taken after fasting for at least 8 hours.  Office Visit on 12/17/2021  Component Date Value Ref Range Status   Prostate Specific Ag, Serum 12/17/2021 0.5  0.0 - 4.0 ng/mL Final   Comment: Roche ECLIA methodology. According to the American Urological Association, Serum PSA should decrease and remain at undetectable  levels after radical prostatectomy. The AUA defines biochemical recurrence as an initial PSA value 0.2 ng/mL or greater followed by a subsequent confirmatory PSA value 0.2 ng/mL or greater. Values obtained with different assay methods or kits cannot be used interchangeably. Results cannot be interpreted as absolute evidence of the presence or absence of malignant disease.    Vit D, 25-Hydroxy 12/17/2021 20.2 (L)  30.0 - 100.0 ng/mL Final   Comment: Vitamin D deficiency has been defined by the Institute of Medicine and an Endocrine Society practice guideline as a level of  serum 25-OH vitamin D less than 20 ng/mL (1,2). The Endocrine Society went on to further define vitamin D insufficiency as a level between 21 and 29 ng/mL (2). 1. IOM (Institute of Medicine). 2010. Dietary reference    intakes for calcium and D. [place]: The    [employer]. 2. Holick MF, [place] [facility], Bischoff-Ferrari HA, et al.    Evaluation, treatment, and prevention of vitamin D    deficiency: an Endocrine Society clinical practice    guideline. JCEM. 2011 Jul; 96(7):1911-30.    WBC 12/17/2021 15.8 (H)  3.4 - 10.8 x10E3/uL Final   RBC 12/17/2021 5.08  4.14 - 5.80 x10E6/uL Final   Hemoglobin 12/17/2021 14.0  13.0 - 17.7 g/dL Final   Hematocrit 44/81/8563 43.4  37.5 - 51.0 % Final   MCV 12/17/2021 85  79 - 97 fL Final   MCH 12/17/2021 27.6  26.6 - 33.0 pg Final   MCHC 12/17/2021 32.3  31.5 - 35.7 g/dL Final   RDW 14/97/0263 13.3  11.6 - 15.4 % Final   Platelets 12/17/2021 338  150 - 450 x10E3/uL Final   Neutrophils 12/17/2021 73  Not Estab. % Final   Lymphs 12/17/2021 14  Not Estab. % Final   Monocytes 12/17/2021 7  Not Estab. % Final   Eos 12/17/2021 5  Not Estab. % Final   Basos 12/17/2021 1  Not Estab. % Final   Neutrophils Absolute 12/17/2021 11.5 (H)  1.4 - 7.0 x10E3/uL Final   Lymphocytes Absolute 12/17/2021 2.3  0.7 - 3.1 x10E3/uL Final   Monocytes Absolute 12/17/2021 1.1 (H)  0.1 - 0.9  x10E3/uL Final   EOS (ABSOLUTE) 12/17/2021 0.8 (H)  0.0 - 0.4 x10E3/uL Final   Basophils Absolute 12/17/2021 0.1  0.0 - 0.2 x10E3/uL Final   Immature Granulocytes 12/17/2021 0  Not Estab. % Final   Immature Grans (Abs) 12/17/2021 0.0  0.0 - 0.1 x10E3/uL Final   Hemoglobin A1C 12/17/2021 6.8 (A)  4.0 - 5.6 % Final  Admission on 12/02/2021, Discharged on 12/02/2021  Component Date Value Ref Range Status   Glucose-Capillary 12/02/2021 210 (H)  70 - 99 mg/dL Final   Glucose reference range applies only to samples taken after fasting for at least 8 hours.   Sodium 12/02/2021 137  135 - 145 mmol/L Final   Potassium 12/02/2021 4.0  3.5 - 5.1 mmol/L Final   Chloride 12/02/2021 108  98 - 111 mmol/L Final   CO2 12/02/2021 26  22 - 32 mmol/L Final   Glucose, Bld 12/02/2021 206 (H)  70 - 99 mg/dL Final   Glucose reference range applies only to samples taken after fasting for at least 8 hours.   BUN 12/02/2021 11  6 - 20 mg/dL Final   Creatinine, Ser 12/02/2021 0.89  0.61 - 1.24 mg/dL Final   Calcium 78/58/8502 8.5 (L)  8.9 - 10.3 mg/dL Final   GFR, Estimated 12/02/2021 >60  >60 mL/min Final   Comment: (NOTE) Calculated using the CKD-EPI Creatinine Equation (2021)    Anion gap 12/02/2021 3 (L)  5 - 15 Final   Performed at [facility], [address] [place], [place] [address]   WBC 12/02/2021 15.1 (H)  4.0 - 10.5 K/uL Final   RBC 12/02/2021 4.82  4.22 - 5.81 MIL/uL Final   Hemoglobin 12/02/2021 13.3  13.0 - 17.0 g/dL Final   HCT 87/86/7672 41.3  39.0 - 52.0 % Final   MCV 12/02/2021 85.7  80.0 - 100.0 fL Final  MCH 12/02/2021 27.6  26.0 - 34.0 pg Final   MCHC 12/02/2021 32.2  30.0 - 36.0 g/dL Final   RDW 16/02/9603 14.1  11.5 - 15.5 % Final   Platelets 12/02/2021 471 (H)  150 - 400 K/uL Final   nRBC 12/02/2021 0.0  0.0 - 0.2 % Final   Performed at [facility], [address] [place], [place] [address]   Color, Urine 12/02/2021 YELLOW  YELLOW Final   APPearance  12/02/2021 CLEAR  CLEAR Final   Specific Gravity, Urine 12/02/2021 1.020  1.005 - 1.030 Final   pH 12/02/2021 7.0  5.0 - 8.0 Final   Glucose, UA 12/02/2021 250 (A)  NEGATIVE mg/dL Final   Hgb urine dipstick 12/02/2021 NEGATIVE  NEGATIVE Final   Bilirubin Urine 12/02/2021 NEGATIVE  NEGATIVE Final   Ketones, ur 12/02/2021 NEGATIVE  NEGATIVE mg/dL Final   Protein, ur 11/91/4782 NEGATIVE  NEGATIVE mg/dL Final   Nitrite 95/62/1308 NEGATIVE  NEGATIVE Final   Leukocytes,Ua 12/02/2021 NEGATIVE  NEGATIVE Final   Comment: Microscopic not done on urines with negative protein, blood, leukocytes, nitrite, or glucose < 500 mg/dL. Performed at [facility], [address] [place], [place] [address]    Alcohol, Ethyl (B) 12/02/2021 <10  <10 mg/dL Final   Comment: (NOTE) Lowest detectable limit for serum alcohol is 10 mg/dL.  For medical purposes only. Performed at [facility], [address] [place], [place] [address]    Acetaminophen (Tylenol), Serum 12/02/2021 <10 (L)  10 - 30 ug/mL Final   Comment: (NOTE) Therapeutic concentrations vary significantly. A range of 10-30 ug/mL  may be an effective concentration for many patients. However, some  are best treated at concentrations outside of this range. Acetaminophen concentrations >150 ug/mL at 4 hours after ingestion  and >50 ug/mL at 12 hours after ingestion are often associated with  toxic reactions.  Performed at [facility], [address] [place], [place] [address]    Salicylate Lvl 12/02/2021 <7.0 (L)  7.0 - 30.0 mg/dL Final   Performed at [facility], [address] [place], [place] [address]  Admission on 11/23/2021, Discharged on 11/23/2021  Component Date Value Ref Range Status   SARS Coronavirus 2 by RT PCR 11/23/2021 NEGATIVE  NEGATIVE Final   Comment: (NOTE) SARS-CoV-2 target nucleic acids are NOT DETECTED.  The SARS-CoV-2 RNA is generally detectable in upper  respiratory specimens during the acute phase of infection. The lowest concentration of SARS-CoV-2 viral copies this assay can detect is 138 copies/mL. A negative result does not preclude SARS-Cov-2 infection and should not be used as the sole basis for treatment or other patient management decisions. A negative result may occur with  improper specimen collection/handling, submission of specimen other than nasopharyngeal swab, presence of viral mutation(s) within the areas targeted by this assay, and inadequate number of viral copies(<138 copies/mL). A negative result must be combined with clinical observations, patient history, and epidemiological information. The expected result is Negative.  Fact Sheet for Patients:  BloggerCourse.com  Fact Sheet for Healthcare Providers:  SeriousBroker.it  This test is no                          t yet approved or cleared by the [place] FDA and  has been authorized for detection and/or diagnosis of SARS-CoV-2 by FDA under an Emergency Use Authorization (EUA). This EUA will remain  in  effect (meaning this test can be used) for the duration of the COVID-19 declaration under Section 564(b)(1) of the Act, 21 U.S.C.section 360bbb-3(b)(1), unless the authorization is terminated  or revoked sooner.       Influenza A by PCR 11/23/2021 NEGATIVE  NEGATIVE Final   Influenza B by PCR 11/23/2021 NEGATIVE  NEGATIVE Final   Comment: (NOTE) The Xpert Xpress SARS-CoV-2/FLU/RSV plus assay is intended as an aid in the diagnosis of influenza from Nasopharyngeal swab specimens and should not be used as a sole basis for treatment. Nasal washings and aspirates are unacceptable for Xpert Xpress SARS-CoV-2/FLU/RSV testing.  Fact Sheet for Patients: BloggerCourse.com  Fact Sheet for Healthcare Providers: SeriousBroker.it  This test is not yet approved or  cleared by the [place] FDA and has been authorized for detection and/or diagnosis of SARS-CoV-2 by FDA under an Emergency Use Authorization (EUA). This EUA will remain in effect (meaning this test can be used) for the duration of the COVID-19 declaration under Section 564(b)(1) of the Act, 21 U.S.C. section 360bbb-3(b)(1), unless the authorization is terminated or revoked.  Performed at [facility], [address] [place], [place] [address]    Sodium 11/23/2021 136  135 - 145 mmol/L Final   Potassium 11/23/2021 3.6  3.5 - 5.1 mmol/L Final   Chloride 11/23/2021 105  98 - 111 mmol/L Final   CO2 11/23/2021 20 (L)  22 - 32 mmol/L Final   Glucose, Bld 11/23/2021 122 (H)  70 - 99 mg/dL Final   Glucose reference range applies only to samples taken after fasting for at least 8 hours.   BUN 11/23/2021 16  6 - 20 mg/dL Final   Creatinine, Ser 11/23/2021 1.15  0.61 - 1.24 mg/dL Final   Calcium 60/45/4098 9.1  8.9 - 10.3 mg/dL Final   Total Protein 11/91/4782 6.4 (L)  6.5 - 8.1 g/dL Final   Albumin 95/62/1308 3.5  3.5 - 5.0 g/dL Final   AST 65/78/4696 34  15 - 41 U/L Final   ALT 11/23/2021 23  0 - 44 U/L Final   Alkaline Phosphatase 11/23/2021 66  38 - 126 U/L Final   Total Bilirubin 11/23/2021 0.7  0.3 - 1.2 mg/dL Final   GFR, Estimated 11/23/2021 >60  >60 mL/min Final   Comment: (NOTE) Calculated using the CKD-EPI Creatinine Equation (2021)    Anion gap 11/23/2021 11  5 - 15 Final   Performed at [facility], [address], [place], [place] [address]   Alcohol, Ethyl (B) 11/23/2021 15 (H)  <10 mg/dL Final   Comment: (NOTE) Lowest detectable limit for serum alcohol is 10 mg/dL.  For medical purposes only. Performed at [facility], [address] [place], [place] [address]    Opiates 11/23/2021 NONE DETECTED  NONE DETECTED Final   Cocaine 11/23/2021 POSITIVE (A)  NONE DETECTED Final   Benzodiazepines 11/23/2021 NONE DETECTED  NONE DETECTED Final    Amphetamines 11/23/2021 NONE DETECTED  NONE DETECTED Final   Tetrahydrocannabinol 11/23/2021 POSITIVE (A)  NONE DETECTED Final   Barbiturates 11/23/2021 NONE DETECTED  NONE DETECTED Final   Comment: (NOTE) DRUG SCREEN FOR MEDICAL PURPOSES ONLY.  IF CONFIRMATION IS NEEDED FOR ANY PURPOSE, NOTIFY LAB WITHIN 5 DAYS.  LOWEST DETECTABLE LIMITS FOR URINE DRUG SCREEN Drug Class                     Cutoff (ng/mL) Amphetamine and metabolites    1000 Barbiturate and metabolites  200 Benzodiazepine                 200 Tricyclics and metabolites     300 Opiates and metabolites        300 Cocaine and metabolites        300 THC                            50 Performed at [facility], [address], [place], [place] [address]    WBC 11/23/2021 26.7 (H)  4.0 - 10.5 K/uL Final   RBC 11/23/2021 4.86  4.22 - 5.81 MIL/uL Final   Hemoglobin 11/23/2021 13.7  13.0 - 17.0 g/dL Final   HCT 60/45/4098 42.9  39.0 - 52.0 % Final   MCV 11/23/2021 88.3  80.0 - 100.0 fL Final   MCH 11/23/2021 28.2  26.0 - 34.0 pg Final   MCHC 11/23/2021 31.9  30.0 - 36.0 g/dL Final   RDW 11/91/4782 14.9  11.5 - 15.5 % Final   Platelets 11/23/2021 423 (H)  150 - 400 K/uL Final   nRBC 11/23/2021 0.0  0.0 - 0.2 % Final   Neutrophils Relative % 11/23/2021 87  % Final   Neutro Abs 11/23/2021 23.2 (H)  1.7 - 7.7 K/uL Final   Lymphocytes Relative 11/23/2021 9  % Final   Lymphs Abs 11/23/2021 2.4  0.7 - 4.0 K/uL Final   Monocytes Relative 11/23/2021 4  % Final   Monocytes Absolute 11/23/2021 1.1 (H)  0.1 - 1.0 K/uL Final   Eosinophils Relative 11/23/2021 0  % Final   Eosinophils Absolute 11/23/2021 0.0  0.0 - 0.5 K/uL Final   Basophils Relative 11/23/2021 0  % Final   Basophils Absolute 11/23/2021 0.0  0.0 - 0.1 K/uL Final   nRBC 11/23/2021 0  0 /100 WBC Final   Abs Immature Granulocytes 11/23/2021 0.00  0.00 - 0.07 K/uL Final   Polychromasia 11/23/2021 PRESENT   Final   Performed at [facility], [address] [place], [place] [address]  Admission on 10/11/2021, Discharged on 10/11/2021  Component Date Value Ref Range Status   WBC 10/11/2021 18.0 (H)  4.0 - 10.5 K/uL Final   RBC 10/11/2021 4.89  4.22 - 5.81 MIL/uL Final   Hemoglobin 10/11/2021 13.7  13.0 - 17.0 g/dL Final   HCT 30/86/5784 42.8  39.0 - 52.0 % Final   MCV 10/11/2021 87.5  80.0 - 100.0 fL Final   MCH 10/11/2021 28.0  26.0 - 34.0 pg Final   MCHC 10/11/2021 32.0  30.0 - 36.0 g/dL Final   RDW 69/62/9528 15.9 (H)  11.5 - 15.5 % Final   Platelets 10/11/2021 445 (H)  150 - 400 K/uL Final   nRBC 10/11/2021 0.0  0.0 - 0.2 % Final   Neutrophils Relative % 10/11/2021 79  % Final   Neutro Abs 10/11/2021 14.1 (H)  1.7 - 7.7 K/uL Final   Lymphocytes Relative 10/11/2021 15  % Final   Lymphs Abs 10/11/2021 2.6  0.7 - 4.0 K/uL Final   Monocytes Relative 10/11/2021 5  % Final   Monocytes Absolute 10/11/2021 0.9  0.1 - 1.0 K/uL Final   Eosinophils Relative 10/11/2021 1  % Final   Eosinophils Absolute 10/11/2021 0.2  0.0 - 0.5 K/uL Final   Basophils Relative 10/11/2021 0  % Final   Basophils Absolute 10/11/2021 0.1  0.0 - 0.1 K/uL Final   Immature Granulocytes 10/11/2021 0  %  Final   Abs Immature Granulocytes 10/11/2021 0.08 (H)  0.00 - 0.07 K/uL Final   Performed at [facility], [address] [place], [place] [address]   Sodium 10/11/2021 139  135 - 145 mmol/L Final   Potassium 10/11/2021 3.8  3.5 - 5.1 mmol/L Final   Chloride 10/11/2021 107  98 - 111 mmol/L Final   CO2 10/11/2021 24  22 - 32 mmol/L Final   Glucose, Bld 10/11/2021 161 (H)  70 - 99 mg/dL Final   Glucose reference range applies only to samples taken after fasting for at least 8 hours.   BUN 10/11/2021 12  6 - 20 mg/dL Final   Creatinine, Ser 10/11/2021 0.99  0.61 - 1.24 mg/dL Final   Calcium 19/14/7829 8.7 (L)  8.9 - 10.3 mg/dL Final   GFR, Estimated 10/11/2021 >60  >60 mL/min Final   Comment: (NOTE) Calculated using the CKD-EPI Creatinine Equation (2021)     Anion gap 10/11/2021 8  5 - 15 Final   Performed at [facility], [address] [place], [place] [address]   TSH 10/11/2021 1.146  0.350 - 4.500 uIU/mL Final   Comment: Performed by a 3rd Generation assay with a functional sensitivity of <=0.01 uIU/mL. Performed at [facility], [address] [place], [place] [address]     Allergies: Latex and Shellfish-derived products  PTA Medications: (Not in a hospital admission)  Prior to Admission medications   Medication Sig Start Date End Date Taking? Authorizing Provider  amLODipine (NORVASC) 10 MG tablet Take 1 tablet (10 mg total) by mouth daily. 01/01/22   Mayers, Cari S, PA-C  atorvastatin (LIPITOR) 20 MG tablet Take 1 tablet (20 mg total) by mouth daily. 01/01/22   Mayers, Cari S, PA-C  cyclobenzaprine (FLEXERIL) 10 MG tablet Take 1 tablet (10 mg total) by mouth at bedtime as needed for muscle spasms. 01/01/22   Mayers, Cari S, PA-C  gabapentin (NEURONTIN) 300 MG capsule Take 2 capsules (600 mg total) by mouth at bedtime. 01/01/22   Mayers, Cari S, PA-C  hydrochlorothiazide (HYDRODIURIL) 25 MG tablet Take 0.5 tablets (12.5 mg total) by mouth daily. 01/01/22   Mayers, Cari S, PA-C  metFORMIN (GLUCOPHAGE) 850 MG tablet Take 1 tablet (850 mg total) by mouth 2 (two) times daily with a meal. 01/01/22   Mayers, Cari S, PA-C  sertraline (ZOLOFT) 50 MG tablet Take 1 tablet (50 mg total) by mouth daily. 01/01/22   Mayers, Cari S, PA-C  triamcinolone ointment (KENALOG) 0.1 % Apply 1 Application topically 2 (two) times daily. 01/01/22   Mayers, Cari S, PA-C  Vitamin D, Ergocalciferol, (DRISDOL) 1.25 MG (50000 UNIT) CAPS capsule Take 1 capsule (50,000 Units total) by mouth every 7 (seven) days. 01/01/22   Mayers, Kasandra Knudsen, PA-C     Medical Decision Making  Recommend inpatient psychiatric admission for crisis stabilization, safety monitoring, substance abuse treatment and medication management.  Labs the past 24 hours reviewed: CMP, CBC with Diff,  UDS, BAC level: WBC elevated, patient has chronic leukocytosis, with no fever, or signs of infection/sepsis.  UDS positive for cocaine.  Blood Alcohol level within normal limits.  COVID-negative.  Home medications restarted this encounter -Norvasc 10 mg p.o. daily HTN -Lipitor 20 mg p.o. daily hypercholesteremia -Neurontin 600 mg p.o. nightly nerve pain -Glucophage 850 mg p.o. twice daily with meals diabetes type 2. -Zoloft 50 mg p.o. daily MDD  Initiate clonidine withdrawal protocol -hydroxyzine 25 mg PO every 6 hours prn for  anxiety -ondansetron 4 mg ODT every 6 hours prn nausea/vomiting -dicyclomine 20 mg PO every 6 hours prn abdominal cramping -loperamide 2-4 mg capsule prn diarrhea -methocarbamol 500 mg PO every 8 hours prn spasms   Multivitamin with minerals 1 tablet p.o. daily.  Other PRNs  -Tylenol 650 mg p.o. every 6 hours as needed pain -Maalox 30 ml p.o. every 4 hours as needed indigestion -MOM 30 mL p.o. daily as needed constipation constipation -Melatonin 5 mg p.o. nightly as needed sleep  Recommendations  Based on my evaluation the patient does not appear to have an emergency medical condition.  Recommend inpatient psychiatric admission. Patient was initially recommended for inpatient admission at the [facility] by the a.m. provider for substance abuse treatment/alcohol detox and addiction problem.  During evaluation at the time, patient was suicidal without a plan.   Tonight, the patient is endorsing active suicidal ideations with a plan to die "suicide by cop".  Given his current presentation, patient might not be appropriate for the [facility] and has been recommended for inpatient psychiatric admission at this [facility].  If there are no appropriate available beds, patient is to be faxed out.  Patient is currently admitted to the continuous observation unit overnight for safety monitoring pending transfer to an inpatient psychiatric unit.  Mancel Bale, NP 01/25/22  2:50  AM

## 2022-01-25 NOTE — ED Notes (Signed)
Patient given breakfast this morning and his medications. Denies current SI/HI/AVH but remains guarded and seems irritated. Pt has been taken to [facility] by Baruch Goldmann, RN.

## 2022-01-25 NOTE — ED Notes (Signed)
Pt sitting in dining room watching TV. A&O x4, calm and cooperative. Denies current SI/HI/AVH. No signs of distress noted. Monitoring for safety.  

## 2022-01-25 NOTE — ED Notes (Signed)
Pt sitting in dayroom quietly watching tv. Denies concerns at present. Informed pt to notify staff with any needs. Verbalized understanding and agreement. Denies withdrawal sx at present. Will continue to monitor for safety.

## 2022-01-25 NOTE — ED Notes (Signed)
Pt a 56 year old male with psychiatric history of MDD, alcohol intoxication without complication, alcohol use disorder severe dependence, polysubstance abuse, and GAD, presents to the [facility] as a transfer from [facility] where he initially presented on 01/23/22 seeking treatment for depression and substance abuse.    Patient endorses suicidal ideation with a plan to die "suicide by cop".  Patient reports "I've been feeling this way off and on for the past month or so".  Patient denies HI, denies AVH.  Patient reports history of depression and endorsed relapse on crack cocaine as his main triggers. Pt calm and cooperative during skin assessment. Pt is in bed appears asleep, rise and fall of chest noted. Will continue with close observations as per facility protocol and update as needed.

## 2022-01-25 NOTE — ED Notes (Signed)
Patient's belongings removed from locker 7, will go with [facility] transport.

## 2022-01-25 NOTE — ED Notes (Signed)
Pt asleep in bed. Respirations even and unlabored. Monitoring for safety. 

## 2022-01-25 NOTE — ED Notes (Signed)
Pt transferred from observation unit requesting detox and residential rehab tx for cocaine abuse. Pt cooperative but irritable throughout interview process. Pt denies SI/HI/AVH. Skin assessment completed. Oriented to unit. Meal and drink offered. Pt verbally contract for safety. Will monitor for safety.

## 2022-01-25 NOTE — ED Provider Notes (Addendum)
Facility Based Crisis Admission H&P  Date: 01/25/22 Patient Name: Jeffery Wheeler MRN: [id number] Chief Complaint: No chief complaint on file.    Diagnoses:  Final diagnoses:  Substance induced mood disorder ([place])  Severe episode of recurrent major depressive disorder, without psychotic features ([place])  Polysubstance abuse ([place])  Crack cocaine use   HPI:   Pt states "I got a drug problem. I've been fighting for quite a while." He states he uses crack "as much as I can get", with "breaks in between", last use was on Tuesday. He reports he also uses alcohol, 2 to 3 40oz/day, with "breaks in between", last use was on Tuesday. He states he was living with his elderly aunt, although after using substances on Tuesday, she would not let him back into the home on Wednesday morning. He denies current SI/VI/HI, AVH, paranoia. He denies any plan or intent to act on a plan. He states he needs help with his substance use before his mood worsens and he begins to experience SI w/ a plan.  He reports history of 1 SA earlier this year, used fentanyl for the first time to try to kill himself. He denies he is using fentanyl/opioids now.   He denies hx of NSSI. He reports hx of 1 SA "earlier this year". Used fentanyl to try to kill himself. He reports hx of 1 inpatient psychiatric hospitalization following SA at [facility].  He reports positive family psychiatric history. He states his mother abused cocaine. His father died by OD on cocaine.   He is not connected with counseling or medication management servgices.  He denies access to a firearm.  He is not currently working.  His highest level of education is completion of the 9th grade.   Pt admitted to [facility].  PHQ 2-9:  Flowsheet Row ED from 01/23/2022 in [place] Office Visit from 01/01/2022 in [place] 1 Office Visit from 12/05/2020 in Primary Care at [facility]  Thoughts that you would be  better off dead, or of hurting yourself in some way More than half the days Not at all Not at all  PHQ-9 Total Score 15 16 13        [place] ED from 01/25/2022 in [facility] ED from 01/23/2022 in [place] ED from 12/02/2021 in [place] No Risk High Risk No Risk        Total Time spent with patient: 20 minutes  Musculoskeletal  Strength & Muscle Tone: within normal limits Gait & Station: normal Patient leans: N/A  Psychiatric Specialty Exam  Presentation General Appearance: Casual  Eye Contact:Fair  Speech:Clear and Coherent; Normal Rate  Speech Volume:Normal  Handedness:Right   Mood and Affect  Mood:Depressed  Affect:Blunt   Thought Process  Thought Processes:Coherent; Goal Directed; Linear  Descriptions of Associations:Intact  Orientation:Full (Time, Place and Person)  Thought Content:Logical  Diagnosis of Schizophrenia or Schizoaffective disorder in past: No   Hallucinations:Hallucinations: None  Ideas of Reference:None  Suicidal Thoughts:Suicidal Thoughts: No  Homicidal Thoughts:Homicidal Thoughts: No   Sensorium  Memory:Immediate Fair  Judgment:Intact  Insight:Present   Executive Functions  Concentration:Fair  Attention Span:Fair  [place]   Psychomotor Activity  Psychomotor Activity:Psychomotor Activity: Normal   Assets  Assets:Communication Skills; Desire for Improvement; Financial Resources/Insurance   Sleep  Sleep:Sleep: Poor   Nutritional Assessment (For OBS and FBC admissions only)  Has the patient had a weight loss or gain of 10 pounds or more in the last 3 months?: No Has the patient had a decrease in food intake/or appetite?: No Does the patient have dental problems?: No Does the patient have eating habits or behaviors that may be indicators of an  eating disorder including binging or inducing vomiting?: No Has the patient recently lost weight without trying?: 0 Has the patient been eating poorly because of a decreased appetite?: 0 Malnutrition Screening Tool Score: 0    Physical Exam Cardiovascular:     Rate and Rhythm: Normal rate.  Pulmonary:     Effort: Pulmonary effort is normal.  Neurological:     Mental Status: He is alert and oriented to person, place, and time.  Psychiatric:        Attention and Perception: Attention and perception normal.        Mood and Affect: Mood is depressed. Affect is blunt.        Speech: Speech normal.        Behavior: Behavior normal. Behavior is cooperative.        Thought Content: Thought content normal.        Cognition and Memory: Cognition and memory normal.    Review of Systems  Constitutional:  Negative for chills and fever.  Respiratory:  Negative for shortness of breath.   Cardiovascular:  Negative for chest pain and palpitations.  Gastrointestinal:  Negative for abdominal pain.  Neurological:  Negative for headaches.  Psychiatric/Behavioral:  Positive for depression and substance abuse.     Blood pressure (!) 156/90, pulse 80, temperature 98.5 F (36.9 C), temperature source Tympanic, resp. rate 18, SpO2 98 %. There is no height or weight on file to calculate BMI.  Past Psychiatric History:    Is the patient at risk to self? No  Has the patient been a risk to self in the past 6 months? Yes .    Has the patient been a risk to self within the distant past? No   Is the patient a risk to others? No   Has the patient been a risk to others in the past 6 months? No   Has the patient been a risk to others within the distant past? Yes   Past Medical History:  Past Medical History:  Diagnosis Date   Eczema    HLD (hyperlipidemia)    HTN (hypertension)    Type 2 diabetes mellitus (HCC)     Past Surgical History:  Procedure Laterality Date   CERVICAL SPINE SURGERY  2018    right shoulder  1991    Family History:  Family History  Problem Relation Age of Onset   Diabetes type II Sister    Diabetes type II Paternal Grandmother    Diabetes type II Maternal Aunt     Social History:  Social History   Socioeconomic History   Marital status: Single    Spouse name: Not on file   Number of children: Not on file   Years of education: Not on file   Highest education level: Not on file  Occupational History   Not on file  Tobacco Use   Smoking status: Never   Smokeless tobacco: Never  Substance and Sexual Activity   Alcohol use: Not Currently   Drug use: Not Currently   Sexual activity: Yes  Other Topics Concern   Not on file  Social History Narrative   ** Merged History Encounter **  Social Determinants of Health   Financial Resource Strain: Not on file  Food Insecurity: Not on file  Transportation Needs: Not on file  Physical Activity: Not on file  Stress: Not on file  Social Connections: Not on file  Intimate Partner Violence: Not on file    SDOH:  SDOH Screenings   Depression (PHQ2-9): High Risk (01/24/2022)  Tobacco Use: Low Risk  (01/03/2022)  Recent Concern: Tobacco Use - High Risk (10/11/2021)    Last Labs:  Admission on 01/23/2022, Discharged on 01/25/2022  Component Date Value Ref Range Status   Sodium 01/23/2022 137  135 - 145 mmol/L Final   Potassium 01/23/2022 4.0  3.5 - 5.1 mmol/L Final   Chloride 01/23/2022 103  98 - 111 mmol/L Final   CO2 01/23/2022 23  22 - 32 mmol/L Final   Glucose, Bld 01/23/2022 123 (H)  70 - 99 mg/dL Final   Glucose reference range applies only to samples taken after fasting for at least 8 hours.   BUN 01/23/2022 11  6 - 20 mg/dL Final   Creatinine, Ser 01/23/2022 1.06  0.61 - 1.24 mg/dL Final   Calcium 16/02/9603 9.0  8.9 - 10.3 mg/dL Final   Total Protein 54/01/8118 6.5  6.5 - 8.1 g/dL Final   Albumin 14/78/2956 3.6  3.5 - 5.0 g/dL Final   AST 21/30/8657 22  15 - 41 U/L Final   ALT  01/23/2022 19  0 - 44 U/L Final   Alkaline Phosphatase 01/23/2022 73  38 - 126 U/L Final   Total Bilirubin 01/23/2022 0.9  0.3 - 1.2 mg/dL Final   GFR, Estimated 01/23/2022 >60  >60 mL/min Final   Comment: (NOTE) Calculated using the CKD-EPI Creatinine Equation (2021)    Anion gap 01/23/2022 11  5 - 15 Final   Performed at [facility], [address], [place], [place] [address]   Opiates 01/23/2022 NONE DETECTED  NONE DETECTED Final   Cocaine 01/23/2022 POSITIVE (A)  NONE DETECTED Final   Benzodiazepines 01/23/2022 NONE DETECTED  NONE DETECTED Final   Amphetamines 01/23/2022 NONE DETECTED  NONE DETECTED Final   Tetrahydrocannabinol 01/23/2022 NONE DETECTED  NONE DETECTED Final   Barbiturates 01/23/2022 NONE DETECTED  NONE DETECTED Final   Comment: (NOTE) DRUG SCREEN FOR MEDICAL PURPOSES ONLY.  IF CONFIRMATION IS NEEDED FOR ANY PURPOSE, NOTIFY LAB WITHIN 5 DAYS.  LOWEST DETECTABLE LIMITS FOR URINE DRUG SCREEN Drug Class                     Cutoff (ng/mL) Amphetamine and metabolites    1000 Barbiturate and metabolites    200 Benzodiazepine                 200 Tricyclics and metabolites     300 Opiates and metabolites        300 Cocaine and metabolites        300 THC                            50 Performed at [facility], [address] [place], [place] [address]    WBC 01/23/2022 22.3 (H)  4.0 - 10.5 K/uL Final   RBC 01/23/2022 5.39  4.22 - 5.81 MIL/uL Final   Hemoglobin 01/23/2022 14.7  13.0 - 17.0 g/dL Final   HCT 41/32/4401 45.6  39.0 - 52.0 % Final   MCV 01/23/2022 84.6  80.0 - 100.0 fL Final  MCH 01/23/2022 27.3  26.0 - 34.0 pg Final   MCHC 01/23/2022 32.2  30.0 - 36.0 g/dL Final   RDW 29/79/8921 16.0 (H)  11.5 - 15.5 % Final   Platelets 01/23/2022 429 (H)  150 - 400 K/uL Final   nRBC 01/23/2022 0.0  0.0 - 0.2 % Final   Neutrophils Relative % 01/23/2022 78  % Final   Neutro Abs 01/23/2022 17.6 (H)  1.7 - 7.7 K/uL Final   Lymphocytes Relative 01/23/2022  11  % Final   Lymphs Abs 01/23/2022 2.4  0.7 - 4.0 K/uL Final   Monocytes Relative 01/23/2022 8  % Final   Monocytes Absolute 01/23/2022 1.8 (H)  0.1 - 1.0 K/uL Final   Eosinophils Relative 01/23/2022 1  % Final   Eosinophils Absolute 01/23/2022 0.2  0.0 - 0.5 K/uL Final   Basophils Relative 01/23/2022 1  % Final   Basophils Absolute 01/23/2022 0.1  0.0 - 0.1 K/uL Final   Immature Granulocytes 01/23/2022 1  % Final   Abs Immature Granulocytes 01/23/2022 0.31 (H)  0.00 - 0.07 K/uL Final   Performed at [facility], [address], [place], [place] [address]   Alcohol, Ethyl (B) 01/23/2022 <10  <10 mg/dL Final   Comment: (NOTE) Lowest detectable limit for serum alcohol is 10 mg/dL.  For medical purposes only. Performed at [facility], [address], [place], [place] [address]    Salicylate Lvl 01/23/2022 <7.0 (L)  7.0 - 30.0 mg/dL Final   Performed at [facility], [address] [place], [place] [address]   Acetaminophen (Tylenol), Serum 01/23/2022 <10 (L)  10 - 30 ug/mL Final   Comment: (NOTE) Therapeutic concentrations vary significantly. A range of 10-30 ug/mL  may be an effective concentration for many patients. However, some  are best treated at concentrations outside of this range. Acetaminophen concentrations >150 ug/mL at 4 hours after ingestion  and >50 ug/mL at 12 hours after ingestion are often associated with  toxic reactions.  Performed at [facility], [address], [place], [place] [address]    SARS Coronavirus 2 by RT PCR 01/23/2022 NEGATIVE  NEGATIVE Final   Comment: (NOTE) SARS-CoV-2 target nucleic acids are NOT DETECTED.  The SARS-CoV-2 RNA is generally detectable in upper and lower respiratory specimens during the acute phase of infection. The lowest concentration of SARS-CoV-2 viral copies this assay can detect is 250 copies / mL. A negative result does not preclude SARS-CoV-2 infection and should not be used as the sole basis  for treatment or other patient management decisions.  A negative result may occur with improper specimen collection / handling, submission of specimen other than nasopharyngeal swab, presence of viral mutation(s) within the areas targeted by this assay, and inadequate number of viral copies (<250 copies / mL). A negative result must be combined with clinical observations, patient history, and epidemiological information.  Fact Sheet for Patients:   RoadLapTop.co.za  Fact Sheet for Healthcare Providers: http://kim-miller.com/  This test is not yet approved or                           cleared by the [place] FDA and has been authorized for detection and/or diagnosis of SARS-CoV-2 by FDA under an Emergency Use Authorization (EUA).  This EUA will remain in effect (meaning this test can be used) for the duration of the COVID-19 declaration under Section 564(b)(1) of the Act, 21 U.S.C.  section 360bbb-3(b)(1), unless the authorization is terminated or revoked sooner.  Performed at [facility], [address], [place], [place] [address]    Glucose-Capillary 01/23/2022 139 (H)  70 - 99 mg/dL Final   Glucose reference range applies only to samples taken after fasting for at least 8 hours.  Office Visit on 12/17/2021  Component Date Value Ref Range Status   Prostate Specific Ag, Serum 12/17/2021 0.5  0.0 - 4.0 ng/mL Final   Comment: Roche ECLIA methodology. According to the American Urological Association, Serum PSA should decrease and remain at undetectable levels after radical prostatectomy. The AUA defines biochemical recurrence as an initial PSA value 0.2 ng/mL or greater followed by a subsequent confirmatory PSA value 0.2 ng/mL or greater. Values obtained with different assay methods or kits cannot be used interchangeably. Results cannot be interpreted as absolute evidence of the presence or absence of malignant disease.     Vit D, 25-Hydroxy 12/17/2021 20.2 (L)  30.0 - 100.0 ng/mL Final   Comment: Vitamin D deficiency has been defined by the Institute of Medicine and an Endocrine Society practice guideline as a level of serum 25-OH vitamin D less than 20 ng/mL (1,2). The Endocrine Society went on to further define vitamin D insufficiency as a level between 21 and 29 ng/mL (2). 1. IOM (Institute of Medicine). 2010. Dietary reference    intakes for calcium and D. [place]: The    [employer]. 2. Holick MF, Binkley Whittier, Bischoff-Ferrari HA, et al.    Evaluation, treatment, and prevention of vitamin D    deficiency: an Endocrine Society clinical practice    guideline. JCEM. 2011 Jul; 96(7):1911-30.    WBC 12/17/2021 15.8 (H)  3.4 - 10.8 x10E3/uL Final   RBC 12/17/2021 5.08  4.14 - 5.80 x10E6/uL Final   Hemoglobin 12/17/2021 14.0  13.0 - 17.7 g/dL Final   Hematocrit 13/12/6576 43.4  37.5 - 51.0 % Final   MCV 12/17/2021 85  79 - 97 fL Final   MCH 12/17/2021 27.6  26.6 - 33.0 pg Final   MCHC 12/17/2021 32.3  31.5 - 35.7 g/dL Final   RDW 46/96/2952 13.3  11.6 - 15.4 % Final   Platelets 12/17/2021 338  150 - 450 x10E3/uL Final   Neutrophils 12/17/2021 73  Not Estab. % Final   Lymphs 12/17/2021 14  Not Estab. % Final   Monocytes 12/17/2021 7  Not Estab. % Final   Eos 12/17/2021 5  Not Estab. % Final   Basos 12/17/2021 1  Not Estab. % Final   Neutrophils Absolute 12/17/2021 11.5 (H)  1.4 - 7.0 x10E3/uL Final   Lymphocytes Absolute 12/17/2021 2.3  0.7 - 3.1 x10E3/uL Final   Monocytes Absolute 12/17/2021 1.1 (H)  0.1 - 0.9 x10E3/uL Final   EOS (ABSOLUTE) 12/17/2021 0.8 (H)  0.0 - 0.4 x10E3/uL Final   Basophils Absolute 12/17/2021 0.1  0.0 - 0.2 x10E3/uL Final   Immature Granulocytes 12/17/2021 0  Not Estab. % Final   Immature Grans (Abs) 12/17/2021 0.0  0.0 - 0.1 x10E3/uL Final   Hemoglobin A1C 12/17/2021 6.8 (A)  4.0 - 5.6 % Final  Admission on 12/02/2021, Discharged on 12/02/2021  Component  Date Value Ref Range Status   Glucose-Capillary 12/02/2021 210 (H)  70 - 99 mg/dL Final   Glucose reference range applies only to samples taken after fasting for at least 8 hours.   Sodium 12/02/2021 137  135 - 145 mmol/L Final   Potassium 12/02/2021 4.0  3.5 -  5.1 mmol/L Final   Chloride 12/02/2021 108  98 - 111 mmol/L Final   CO2 12/02/2021 26  22 - 32 mmol/L Final   Glucose, Bld 12/02/2021 206 (H)  70 - 99 mg/dL Final   Glucose reference range applies only to samples taken after fasting for at least 8 hours.   BUN 12/02/2021 11  6 - 20 mg/dL Final   Creatinine, Ser 12/02/2021 0.89  0.61 - 1.24 mg/dL Final   Calcium 16/02/9603 8.5 (L)  8.9 - 10.3 mg/dL Final   GFR, Estimated 12/02/2021 >60  >60 mL/min Final   Comment: (NOTE) Calculated using the CKD-EPI Creatinine Equation (2021)    Anion gap 12/02/2021 3 (L)  5 - 15 Final   Performed at [facility], [address] [place], [place] [address]   WBC 12/02/2021 15.1 (H)  4.0 - 10.5 K/uL Final   RBC 12/02/2021 4.82  4.22 - 5.81 MIL/uL Final   Hemoglobin 12/02/2021 13.3  13.0 - 17.0 g/dL Final   HCT 11/91/4782 41.3  39.0 - 52.0 % Final   MCV 12/02/2021 85.7  80.0 - 100.0 fL Final   MCH 12/02/2021 27.6  26.0 - 34.0 pg Final   MCHC 12/02/2021 32.2  30.0 - 36.0 g/dL Final   RDW 95/62/1308 14.1  11.5 - 15.5 % Final   Platelets 12/02/2021 471 (H)  150 - 400 K/uL Final   nRBC 12/02/2021 0.0  0.0 - 0.2 % Final   Performed at [facility], [address] [place], [place] [address]   Color, Urine 12/02/2021 YELLOW  YELLOW Final   APPearance 12/02/2021 CLEAR  CLEAR Final   Specific Gravity, Urine 12/02/2021 1.020  1.005 - 1.030 Final   pH 12/02/2021 7.0  5.0 - 8.0 Final   Glucose, UA 12/02/2021 250 (A)  NEGATIVE mg/dL Final   Hgb urine dipstick 12/02/2021 NEGATIVE  NEGATIVE Final   Bilirubin Urine 12/02/2021 NEGATIVE  NEGATIVE Final   Ketones, ur 12/02/2021 NEGATIVE  NEGATIVE mg/dL Final   Protein, ur 69/62/9528  NEGATIVE  NEGATIVE mg/dL Final   Nitrite 41/32/4401 NEGATIVE  NEGATIVE Final   Leukocytes,Ua 12/02/2021 NEGATIVE  NEGATIVE Final   Comment: Microscopic not done on urines with negative protein, blood, leukocytes, nitrite, or glucose < 500 mg/dL. Performed at [facility], [address] [place], [place] [address]    Alcohol, Ethyl (B) 12/02/2021 <10  <10 mg/dL Final   Comment: (NOTE) Lowest detectable limit for serum alcohol is 10 mg/dL.  For medical purposes only. Performed at [facility], [address] [place], [place] [address]    Acetaminophen (Tylenol), Serum 12/02/2021 <10 (L)  10 - 30 ug/mL Final   Comment: (NOTE) Therapeutic concentrations vary significantly. A range of 10-30 ug/mL  may be an effective concentration for many patients. However, some  are best treated at concentrations outside of this range. Acetaminophen concentrations >150 ug/mL at 4 hours after ingestion  and >50 ug/mL at 12 hours after ingestion are often associated with  toxic reactions.  Performed at [facility], [address] [place], [place] [address]    Salicylate Lvl 12/02/2021 <7.0 (L)  7.0 - 30.0 mg/dL Final   Performed at [facility], [address] [place], [place] [address]  Admission on 11/23/2021, Discharged on 11/23/2021  Component Date Value Ref Range Status   SARS Coronavirus 2 by RT PCR 11/23/2021 NEGATIVE  NEGATIVE Final   Comment: (  NOTE) SARS-CoV-2 target nucleic acids are NOT DETECTED.  The SARS-CoV-2 RNA is generally detectable in upper respiratory specimens during the acute phase of infection. The lowest concentration of SARS-CoV-2 viral copies this assay can detect is 138 copies/mL. A negative result does not preclude SARS-Cov-2 infection and should not be used as the sole basis for treatment or other patient management decisions. A negative result may occur with  improper specimen collection/handling, submission of specimen  other than nasopharyngeal swab, presence of viral mutation(s) within the areas targeted by this assay, and inadequate number of viral copies(<138 copies/mL). A negative result must be combined with clinical observations, patient history, and epidemiological information. The expected result is Negative.  Fact Sheet for Patients:  BloggerCourse.com  Fact Sheet for Healthcare Providers:  SeriousBroker.it  This test is no                          t yet approved or cleared by the [place] FDA and  has been authorized for detection and/or diagnosis of SARS-CoV-2 by FDA under an Emergency Use Authorization (EUA). This EUA will remain  in effect (meaning this test can be used) for the duration of the COVID-19 declaration under Section 564(b)(1) of the Act, 21 U.S.C.section 360bbb-3(b)(1), unless the authorization is terminated  or revoked sooner.       Influenza A by PCR 11/23/2021 NEGATIVE  NEGATIVE Final   Influenza B by PCR 11/23/2021 NEGATIVE  NEGATIVE Final   Comment: (NOTE) The Xpert Xpress SARS-CoV-2/FLU/RSV plus assay is intended as an aid in the diagnosis of influenza from Nasopharyngeal swab specimens and should not be used as a sole basis for treatment. Nasal washings and aspirates are unacceptable for Xpert Xpress SARS-CoV-2/FLU/RSV testing.  Fact Sheet for Patients: BloggerCourse.com  Fact Sheet for Healthcare Providers: SeriousBroker.it  This test is not yet approved or cleared by the [place] FDA and has been authorized for detection and/or diagnosis of SARS-CoV-2 by FDA under an Emergency Use Authorization (EUA). This EUA will remain in effect (meaning this test can be used) for the duration of the COVID-19 declaration under Section 564(b)(1) of the Act, 21 U.S.C. section 360bbb-3(b)(1), unless the authorization is terminated or revoked.  Performed at  [facility], [address], [place], [place] [address]    Sodium 11/23/2021 136  135 - 145 mmol/L Final   Potassium 11/23/2021 3.6  3.5 - 5.1 mmol/L Final   Chloride 11/23/2021 105  98 - 111 mmol/L Final   CO2 11/23/2021 20 (L)  22 - 32 mmol/L Final   Glucose, Bld 11/23/2021 122 (H)  70 - 99 mg/dL Final   Glucose reference range applies only to samples taken after fasting for at least 8 hours.   BUN 11/23/2021 16  6 - 20 mg/dL Final   Creatinine, Ser 11/23/2021 1.15  0.61 - 1.24 mg/dL Final   Calcium 67/04/4579 9.1  8.9 - 10.3 mg/dL Final   Total Protein 99/83/3825 6.4 (L)  6.5 - 8.1 g/dL Final   Albumin 05/39/7673 3.5  3.5 - 5.0 g/dL Final   AST 41/93/7902 34  15 - 41 U/L Final   ALT 11/23/2021 23  0 - 44 U/L Final   Alkaline Phosphatase 11/23/2021 66  38 - 126 U/L Final   Total Bilirubin 11/23/2021 0.7  0.3 - 1.2 mg/dL Final   GFR, Estimated 11/23/2021 >60  >60 mL/min Final   Comment: (NOTE) Calculated using the CKD-EPI Creatinine Equation (2021)  Anion gap 11/23/2021 11  5 - 15 Final   Performed at [facility], [address], [place], [place] [address]   Alcohol, Ethyl (B) 11/23/2021 15 (H)  <10 mg/dL Final   Comment: (NOTE) Lowest detectable limit for serum alcohol is 10 mg/dL.  For medical purposes only. Performed at [facility], [address], [place], [place] [address]    Opiates 11/23/2021 NONE DETECTED  NONE DETECTED Final   Cocaine 11/23/2021 POSITIVE (A)  NONE DETECTED Final   Benzodiazepines 11/23/2021 NONE DETECTED  NONE DETECTED Final   Amphetamines 11/23/2021 NONE DETECTED  NONE DETECTED Final   Tetrahydrocannabinol 11/23/2021 POSITIVE (A)  NONE DETECTED Final   Barbiturates 11/23/2021 NONE DETECTED  NONE DETECTED Final   Comment: (NOTE) DRUG SCREEN FOR MEDICAL PURPOSES ONLY.  IF CONFIRMATION IS NEEDED FOR ANY PURPOSE, NOTIFY LAB WITHIN 5 DAYS.  LOWEST DETECTABLE LIMITS FOR URINE DRUG SCREEN Drug Class                     Cutoff  (ng/mL) Amphetamine and metabolites    1000 Barbiturate and metabolites    200 Benzodiazepine                 200 Tricyclics and metabolites     300 Opiates and metabolites        300 Cocaine and metabolites        300 THC                            50 Performed at [facility], [address], [place], [place] [address]    WBC 11/23/2021 26.7 (H)  4.0 - 10.5 K/uL Final   RBC 11/23/2021 4.86  4.22 - 5.81 MIL/uL Final   Hemoglobin 11/23/2021 13.7  13.0 - 17.0 g/dL Final   HCT 21/30/8657 42.9  39.0 - 52.0 % Final   MCV 11/23/2021 88.3  80.0 - 100.0 fL Final   MCH 11/23/2021 28.2  26.0 - 34.0 pg Final   MCHC 11/23/2021 31.9  30.0 - 36.0 g/dL Final   RDW 84/69/6295 14.9  11.5 - 15.5 % Final   Platelets 11/23/2021 423 (H)  150 - 400 K/uL Final   nRBC 11/23/2021 0.0  0.0 - 0.2 % Final   Neutrophils Relative % 11/23/2021 87  % Final   Neutro Abs 11/23/2021 23.2 (H)  1.7 - 7.7 K/uL Final   Lymphocytes Relative 11/23/2021 9  % Final   Lymphs Abs 11/23/2021 2.4  0.7 - 4.0 K/uL Final   Monocytes Relative 11/23/2021 4  % Final   Monocytes Absolute 11/23/2021 1.1 (H)  0.1 - 1.0 K/uL Final   Eosinophils Relative 11/23/2021 0  % Final   Eosinophils Absolute 11/23/2021 0.0  0.0 - 0.5 K/uL Final   Basophils Relative 11/23/2021 0  % Final   Basophils Absolute 11/23/2021 0.0  0.0 - 0.1 K/uL Final   nRBC 11/23/2021 0  0 /100 WBC Final   Abs Immature Granulocytes 11/23/2021 0.00  0.00 - 0.07 K/uL Final   Polychromasia 11/23/2021 PRESENT   Final   Performed at [facility], [address], [place], [place] [address]  Admission on 10/11/2021, Discharged on 10/11/2021  Component Date Value Ref Range Status   WBC 10/11/2021 18.0 (H)  4.0 - 10.5 K/uL Final   RBC 10/11/2021 4.89  4.22 - 5.81 MIL/uL Final   Hemoglobin 10/11/2021 13.7  13.0 -  17.0 g/dL Final   HCT 40/98/119106/12/2021 42.8  39.0 - 52.0 % Final   MCV 10/11/2021 87.5  80.0 - 100.0 fL Final   MCH 10/11/2021 28.0  26.0 - 34.0 pg Final    MCHC 10/11/2021 32.0  30.0 - 36.0 g/dL Final   RDW 47/82/956206/12/2021 15.9 (H)  11.5 - 15.5 % Final   Platelets 10/11/2021 445 (H)  150 - 400 K/uL Final   nRBC 10/11/2021 0.0  0.0 - 0.2 % Final   Neutrophils Relative % 10/11/2021 79  % Final   Neutro Abs 10/11/2021 14.1 (H)  1.7 - 7.7 K/uL Final   Lymphocytes Relative 10/11/2021 15  % Final   Lymphs Abs 10/11/2021 2.6  0.7 - 4.0 K/uL Final   Monocytes Relative 10/11/2021 5  % Final   Monocytes Absolute 10/11/2021 0.9  0.1 - 1.0 K/uL Final   Eosinophils Relative 10/11/2021 1  % Final   Eosinophils Absolute 10/11/2021 0.2  0.0 - 0.5 K/uL Final   Basophils Relative 10/11/2021 0  % Final   Basophils Absolute 10/11/2021 0.1  0.0 - 0.1 K/uL Final   Immature Granulocytes 10/11/2021 0  % Final   Abs Immature Granulocytes 10/11/2021 0.08 (H)  0.00 - 0.07 K/uL Final   Performed at [facility], [address] [place], [place] [address]   Sodium 10/11/2021 139  135 - 145 mmol/L Final   Potassium 10/11/2021 3.8  3.5 - 5.1 mmol/L Final   Chloride 10/11/2021 107  98 - 111 mmol/L Final   CO2 10/11/2021 24  22 - 32 mmol/L Final   Glucose, Bld 10/11/2021 161 (H)  70 - 99 mg/dL Final   Glucose reference range applies only to samples taken after fasting for at least 8 hours.   BUN 10/11/2021 12  6 - 20 mg/dL Final   Creatinine, Ser 10/11/2021 0.99  0.61 - 1.24 mg/dL Final   Calcium 57/84/696206/12/2021 8.7 (L)  8.9 - 10.3 mg/dL Final   GFR, Estimated 10/11/2021 >60  >60 mL/min Final   Comment: (NOTE) Calculated using the CKD-EPI Creatinine Equation (2021)    Anion gap 10/11/2021 8  5 - 15 Final   Performed at [facility], [address] [place], [place] [address]   TSH 10/11/2021 1.146  0.350 - 4.500 uIU/mL Final   Comment: Performed by a 3rd Generation assay with a functional sensitivity of <=0.01 uIU/mL. Performed at [facility], [address] [place], [place] [address]    Allergies: Latex and Shellfish-derived products  PTA Medications:  (Not in a hospital admission)  Long Term Goals: Improvement in symptoms so as ready for discharge  Short Term Goals: Patient will verbalize feelings in meetings with treatment team members., Pt will complete the PHQ9 on admission, day 3 and discharge., and Patient will take medications as prescribed daily.  Medical Decision Making  Pt [facility] today for further crisis stabilization.  Recommendations  Based on my evaluation the patient does not appear to have an emergency medical condition.  Lauree ChandlerJacqueline Eun Avyan, NP 01/25/22  1:48 PM

## 2022-01-25 NOTE — ED Notes (Signed)
Patient taken to [facility] transport by tech.  Belongings given to safe transport.

## 2022-01-25 NOTE — ED Notes (Signed)
Pt received Ibuprofen for c/o toothache 5/10

## 2022-01-25 NOTE — BH IP Treatment Plan (Signed)
Interdisciplinary Treatment and Diagnostic Plan Update  01/25/2022 Time of Session: [place] MRN: [id number]  Diagnosis:  Final diagnoses:  Substance induced mood disorder ([place])  Severe episode of recurrent major depressive disorder, without psychotic features ([place])  Polysubstance abuse ([place])  Crack cocaine use     Current Medications:  Current Facility-Administered Medications  Medication Dose Route Frequency Provider Last Rate Last Admin   acetaminophen (TYLENOL) tablet 650 mg  650 mg Oral Q6H PRN Onuoha, Chinwendu V, NP       alum & mag hydroxide-simeth (MAALOX/MYLANTA) 200-200-20 MG/5ML suspension 30 mL  30 mL Oral Q4H PRN Onuoha, Chinwendu V, NP       amLODipine (NORVASC) tablet 10 mg  10 mg Oral Daily Onuoha, Chinwendu V, NP   10 mg at 01/25/22 0907   atorvastatin (LIPITOR) tablet 20 mg  20 mg Oral Daily Onuoha, Chinwendu V, NP   20 mg at 01/25/22 0906   dicyclomine (BENTYL) tablet 20 mg  20 mg Oral Q6H PRN Onuoha, Chinwendu V, NP       gabapentin (NEURONTIN) capsule 600 mg  600 mg Oral QHS Onuoha, Chinwendu V, NP       hydrOXYzine (ATARAX) tablet 25 mg  25 mg Oral Q6H PRN Onuoha, Chinwendu V, NP       ibuprofen (ADVIL) tablet 200 mg  200 mg Oral Q6H PRN Merrily Brittle, DO   200 mg at 01/25/22 1059   loperamide (IMODIUM) capsule 2-4 mg  2-4 mg Oral PRN Onuoha, Chinwendu V, NP       magnesium hydroxide (MILK OF MAGNESIA) suspension 30 mL  30 mL Oral Daily PRN Onuoha, Chinwendu V, NP       melatonin tablet 5 mg  5 mg Oral QHS PRN Onuoha, Chinwendu V, NP       metFORMIN (GLUCOPHAGE) tablet 850 mg  850 mg Oral BID WC Onuoha, Chinwendu V, NP   850 mg at 01/25/22 0855   methocarbamol (ROBAXIN) tablet 500 mg  500 mg Oral Q8H PRN Onuoha, Chinwendu V, NP       multivitamin with minerals tablet 1 tablet  1 tablet Oral Daily Onuoha, Chinwendu V, NP   1 tablet at 01/25/22 0906   ondansetron (ZOFRAN-ODT) disintegrating tablet 4 mg  4 mg Oral Q6H PRN Onuoha, Chinwendu V, NP        sertraline (ZOLOFT) tablet 50 mg  50 mg Oral Daily Onuoha, Chinwendu V, NP   50 mg at 01/25/22 0906   Current Outpatient Medications  Medication Sig Dispense Refill   amLODipine (NORVASC) 10 MG tablet Take 1 tablet (10 mg total) by mouth daily. 30 tablet 1   atorvastatin (LIPITOR) 20 MG tablet Take 1 tablet (20 mg total) by mouth daily. 30 tablet 1   cyclobenzaprine (FLEXERIL) 10 MG tablet Take 1 tablet (10 mg total) by mouth at bedtime as needed for muscle spasms. 30 tablet 1   gabapentin (NEURONTIN) 300 MG capsule Take 2 capsules (600 mg total) by mouth at bedtime. 60 capsule 1   hydrochlorothiazide (HYDRODIURIL) 25 MG tablet Take 0.5 tablets (12.5 mg total) by mouth daily. 30 tablet 1   metFORMIN (GLUCOPHAGE) 850 MG tablet Take 1 tablet (850 mg total) by mouth 2 (two) times daily with a meal. 60 tablet 1   sertraline (ZOLOFT) 50 MG tablet Take 1 tablet (50 mg total) by mouth daily. 30 tablet 1   triamcinolone ointment (KENALOG) 0.1 % Apply 1 Application topically 2 (two) times daily. 80 g 1  Vitamin D, Ergocalciferol, (DRISDOL) 1.25 MG (50000 UNIT) CAPS capsule Take 1 capsule (50,000 Units total) by mouth every 7 (seven) days. 4 capsule 2   PTA Medications: Prior to Admission medications   Medication Sig Start Date End Date Taking? Authorizing Provider  amLODipine (NORVASC) 10 MG tablet Take 1 tablet (10 mg total) by mouth daily. 01/01/22   Mayers, Cari S, PA-C  atorvastatin (LIPITOR) 20 MG tablet Take 1 tablet (20 mg total) by mouth daily. 01/01/22   Mayers, Cari S, PA-C  cyclobenzaprine (FLEXERIL) 10 MG tablet Take 1 tablet (10 mg total) by mouth at bedtime as needed for muscle spasms. 01/01/22   Mayers, Cari S, PA-C  gabapentin (NEURONTIN) 300 MG capsule Take 2 capsules (600 mg total) by mouth at bedtime. 01/01/22   Mayers, Cari S, PA-C  hydrochlorothiazide (HYDRODIURIL) 25 MG tablet Take 0.5 tablets (12.5 mg total) by mouth daily. 01/01/22   Mayers, Cari S, PA-C  metFORMIN (GLUCOPHAGE)  850 MG tablet Take 1 tablet (850 mg total) by mouth 2 (two) times daily with a meal. 01/01/22   Mayers, Cari S, PA-C  sertraline (ZOLOFT) 50 MG tablet Take 1 tablet (50 mg total) by mouth daily. 01/01/22   Mayers, Cari S, PA-C  triamcinolone ointment (KENALOG) 0.1 % Apply 1 Application topically 2 (two) times daily. 01/01/22   Mayers, Cari S, PA-C  Vitamin D, Ergocalciferol, (DRISDOL) 1.25 MG (50000 UNIT) CAPS capsule Take 1 capsule (50,000 Units total) by mouth every 7 (seven) days. 01/01/22   Mayers, Loraine Grip, PA-C    Patient Stressors: Financial difficulties   Occupational concerns   Substance abuse   Other: Released from prison in 2022 and still trying to adjust to being in the community; taking care of elderly aunt along with his great nephews who are in her care.    Patient Strengths: Ability for insight  Communication skills  General fund of knowledge  Motivation for treatment/growth  Physical Health  Work skills   Treatment Modalities: Medication Management, Group therapy, Case management,  1 to 1 session with clinician, Psychoeducation, Recreational therapy.   Physician Treatment Plan for Primary and Secondary Diagnosis:  Final diagnoses:  Substance induced mood disorder ([place])  Severe episode of recurrent major depressive disorder, without psychotic features ([place])  Polysubstance abuse ([place])  Crack cocaine use   Long Term Goal(s): Improvement in symptoms so as ready for discharge  Short Term Goals: Patient will verbalize feelings in meetings with treatment team members. Pt will complete the PHQ9 on admission, day 3 and discharge. Patient will take medications as prescribed daily.  Medication Management: Evaluate patient's response, side effects, and tolerance of medication regimen.  Therapeutic Interventions: 1 to 1 sessions, Unit Group sessions and Medication administration.  Evaluation of Outcomes: Not Met  LCSW Treatment Plan for Primary Diagnosis:  Final diagnoses:   Substance induced mood disorder ([place])  Severe episode of recurrent major depressive disorder, without psychotic features ([place])  Polysubstance abuse ([place])  Crack cocaine use    Long Term Goal(s): Safe transition to appropriate next level of care at discharge.  Short Term Goals: Facilitate acceptance of mental health diagnosis and concerns through verbal commitment to aftercare plan and appointments at discharge., Patient will identify one social support prior to discharge to aid in patient's recovery., Patient will attend AA/NA groups as scheduled., Identify minimum of 2 triggers associated with mental health/substance abuse issues with treatment team members., and Increase skills for wellness and recovery by attending 50% of scheduled groups.  Therapeutic Interventions:  Assess for all discharge needs, 1 to 1 time with Social worker, Explore available resources and support systems, Assess for adequacy in community support network, Educate family and significant other(s) on suicide prevention, Complete Psychosocial Assessment, Interpersonal group therapy.  Evaluation of Outcomes: Not Met   Progress in Treatment: Attending groups: Just transferred to [facility]  Participating in groups: No. Just transferred to [facility]. Taking medication as prescribed: Yes. Toleration medication: Yes. Family/Significant other contact made: Yes, individual(s) contacted:  Aunt Patient understands diagnosis: Yes. Discussing patient identified problems/goals with staff: Yes. Medical problems stabilized or resolved: Yes. Denies suicidal/homicidal ideation: Yes. Issues/concerns per patient self-inventory: Yes. Other: Concerned about letting his aunt down if he goes into a rehab program.  New problem(s) identified: No, Describe:  Nothing else noted.  New Short Term/Long Term Goal(s): To be over the addiction.  Patient Goals:  To go to a rehab treatment in [place].  Discharge Plan or Barriers: Worried about his aunt  and what will happen to her and if she will be disappointed in him if he goes.  Reason for Continuation of Hospitalization: Medication stabilization Withdrawal symptoms  Estimated Length of Stay:  Last 3 [place] Suicide Severity Risk Score: [place] ED from 01/25/2022 in [facility] ED from 01/23/2022 in [place] ED from 12/02/2021 in [place] No Risk High Risk No Risk       Last PHQ 2/9 Scores:    01/24/2022   10:05 AM 01/01/2022   12:45 PM 12/05/2020    9:52 AM  Depression screen PHQ 2/9  Decreased Interest _0 Down, Depressed, Hopeless _1 PHQ - 2 Score _2 Altered sleeping _3 Tired, decreased energy _4 Change in appetite 1 3 0  Feeling bad or failure about yourself  2 0 2  Trouble concentrating 1 0 2  Moving slowly or fidgety/restless 0 2 2  Suicidal thoughts 2 0 0  PHQ-9 Score _5 Difficult doing work/chores  Very difficult     Scribe for Treatment Team: Kerri Perches, LCSW 01/25/2022 4:51 PM

## 2022-01-25 NOTE — Progress Notes (Signed)
CSW followed up with full care team about transfer to [facility]. Care Tam was able to successfully assist and transfer pt to [facility] this evening.    Care Team members notified: Cornelia Copa, RN, Marylen Ponto, RN, Otho Perl, RN, Merrily Brittle, DO, Adrian Prince, RN, Leota Jacobsen, LPN, Yehuda Budd, RN, Darrol Angel, NP, Merrily Pew, MD    Benjaman Kindler, MSW, [facility] 01/25/2022 12:18 AM

## 2022-01-26 MED ORDER — TRIAMCINOLONE ACETONIDE 0.1 % EX CREA
TOPICAL_CREAM | Freq: Two times a day (BID) | CUTANEOUS | Status: DC
  Administered 2022-01-26 – 2022-01-27 (×2): 1 via TOPICAL
  Filled 2022-01-26: qty 15

## 2022-01-26 MED ORDER — HYDROXYZINE HCL 25 MG PO TABS: 50.0000 mg | ORAL_TABLET | Freq: Every evening | ORAL | Status: DC | PRN

## 2022-01-26 MED ORDER — HYDROCERIN EX CREA: TOPICAL_CREAM | Freq: Two times a day (BID) | CUTANEOUS | Status: DC | PRN

## 2022-01-26 NOTE — ED Notes (Signed)
Patient is sleeping no problems indicated.  

## 2022-01-26 NOTE — ED Notes (Signed)
Pt asleep in bed. Respirations even and unlabored. Monitoring for safety. 

## 2022-01-26 NOTE — ED Provider Notes (Addendum)
Behavioral Health Progress Note  Date and Time: 01/26/2022 10:51 AM Name: Jeffery Wheeler MRN:  [id number]  Subjective: Patient seen and evaluated face-to-face by this provider, chart reviewed and case discussed with Dr. Gasper Sells. On evaluation, patient is alert and oriented x 4. His thought process is logical and goal oriented. His speech is clear and coherent. His mood is dysphoric and affect is congruent. He denies suicidal ideations. He denies homicidal ideations. He denies auditory or visual hallucinations. There is no objective evidence that the patient is currently responding to internal or external stimuli. He denies feeling anxious today states in the past he has experienced anxiety. He states that this morning he is not feeling depressed but he usually depressed later on in the evening when he starts to think about his patterns of drug use. He states that his aunt is his only support person but she does not understand drug addiction. He states that she thinks it is something that the can stop doing overnight. He states that he lives in bad area that has easy access to drugs. He states that he was also in prison for 30 years and was released last March. He states that he has relapsed on drugs 3 times since being out of prison. He states that he is interested in going to a treatment facility in [place] so he can get away from his current environment and maintain his sobriety. He reports difficulty sleeping at night but states that he feels rested today. He states that at night his mind is racing and won't shut down. He states that in the past he's tried trazodone for sleep at [facility] but it made his throat dry and caused him to cough up mucus. I initially discussed with the patient adding Vistaril 50 mg p.o. nightly prn for sleep/anxiety. However, patient has melatonin 5 mg nightly ordered for sleep that he has not requested to take. Will advise patient to try taking the melatonin for sleep  tonight. He reports having eczema to his lower legs and left elbow. He requested triamcinolone to help with the dryness and itchiness. He is compliant with current medication regimen and denies medication side effects.   HPI: Per chart review: Patient presented to the [facility] with c/o "I got a drug problem. I've been fighting for quite a while." He states he uses crack "as much as I can get", with "breaks in between", last use was on Tuesday. He reports he also uses alcohol, 2 to 3 40oz/day, with "breaks in between", last use was on Tuesday. He states he was living with his elderly aunt, although after using substances on Tuesday, she would not let him back into the home on Wednesday morning. He denies current SI/VI/HI, AVH, paranoia. He denies any plan or intent to act on a plan. He states he needs help with his substance use before his mood worsens and he begins to experience SI w/ a plan. He reports history of 1 SA earlier this year, used fentanyl for the first time to try to kill himself. He denies he is using fentanyl/opioids now.  Diagnosis:  Final diagnoses:  Substance induced mood disorder (HCC)  Severe episode of recurrent major depressive disorder, without psychotic features (HCC)  Polysubstance abuse (HCC)  Crack cocaine use    Total Time spent with patient: 20 minutes  Past Psychiatric History: per chart review from H&P. He reports hx of 1 SA "earlier this year". Used fentanyl to try to kill himself. He reports hx of 1  inpatient psychiatric hospitalization following SA at [facility].  Past Medical History:  Past Medical History:  Diagnosis Date   Eczema    HLD (hyperlipidemia)    HTN (hypertension)    Type 2 diabetes mellitus (HCC)     Past Surgical History:  Procedure Laterality Date   CERVICAL SPINE SURGERY  2018   right shoulder  1991   Family History:  Family History  Problem Relation Age of Onset   Diabetes type II Sister    Diabetes type II Paternal  Grandmother    Diabetes type II Maternal Aunt    Family Psychiatric  History: Mother abused cocaine. His father died by OD on cocaine.    Social History:  Social History   Substance and Sexual Activity  Alcohol Use Not Currently     Social History   Substance and Sexual Activity  Drug Use Not Currently    Social History   Socioeconomic History   Marital status: Single    Spouse name: Not on file   Number of children: Not on file   Years of education: Not on file   Highest education level: Not on file  Occupational History   Not on file  Tobacco Use   Smoking status: Never   Smokeless tobacco: Never  Substance and Sexual Activity   Alcohol use: Not Currently   Drug use: Not Currently   Sexual activity: Yes  Other Topics Concern   Not on file  Social History Narrative   ** Merged History Encounter **       Social Determinants of Health   Financial Resource Strain: Not on file  Food Insecurity: Not on file  Transportation Needs: Not on file  Physical Activity: Not on file  Stress: Not on file  Social Connections: Not on file   SDOH:  SDOH Screenings   Depression (PHQ2-9): High Risk (01/24/2022)  Tobacco Use: Low Risk  (01/03/2022)  Recent Concern: Tobacco Use - High Risk (10/11/2021)    Current Medications:  Current Facility-Administered Medications  Medication Dose Route Frequency Provider Last Rate Last Admin   acetaminophen (TYLENOL) tablet 650 mg  650 mg Oral Q6H PRN Onuoha, Chinwendu V, NP       alum & mag hydroxide-simeth (MAALOX/MYLANTA) 200-200-20 MG/5ML suspension 30 mL  30 mL Oral Q4H PRN Onuoha, Chinwendu V, NP       amLODipine (NORVASC) tablet 10 mg  10 mg Oral Daily Onuoha, Chinwendu V, NP   10 mg at 01/26/22 0937   atorvastatin (LIPITOR) tablet 20 mg  20 mg Oral Daily Onuoha, Chinwendu V, NP   20 mg at 01/26/22 0933   dicyclomine (BENTYL) tablet 20 mg  20 mg Oral Q6H PRN Onuoha, Chinwendu V, NP       gabapentin (NEURONTIN) capsule 600 mg  600 mg  Oral QHS Onuoha, Chinwendu V, NP   600 mg at 01/25/22 2127   hydrOXYzine (ATARAX) tablet 25 mg  25 mg Oral Q6H PRN Onuoha, Chinwendu V, NP       ibuprofen (ADVIL) tablet 200 mg  200 mg Oral Q6H PRN Princess BruinsNguyen, Julie, DO   200 mg at 01/25/22 1059   loperamide (IMODIUM) capsule 2-4 mg  2-4 mg Oral PRN Onuoha, Chinwendu V, NP       magnesium hydroxide (MILK OF MAGNESIA) suspension 30 mL  30 mL Oral Daily PRN Onuoha, Chinwendu V, NP       melatonin tablet 5 mg  5 mg Oral QHS PRN Onuoha, Chinwendu V, NP  metFORMIN (GLUCOPHAGE) tablet 850 mg  850 mg Oral BID WC Onuoha, Chinwendu V, NP   850 mg at 01/26/22 0934   methocarbamol (ROBAXIN) tablet 500 mg  500 mg Oral Q8H PRN Onuoha, Chinwendu V, NP       multivitamin with minerals tablet 1 tablet  1 tablet Oral Daily Onuoha, Chinwendu V, NP   1 tablet at 01/26/22 0935   ondansetron (ZOFRAN-ODT) disintegrating tablet 4 mg  4 mg Oral Q6H PRN Onuoha, Chinwendu V, NP       sertraline (ZOLOFT) tablet 50 mg  50 mg Oral Daily Onuoha, Chinwendu V, NP   50 mg at 01/26/22 1610   Current Outpatient Medications  Medication Sig Dispense Refill   amLODipine (NORVASC) 10 MG tablet Take 1 tablet (10 mg total) by mouth daily. 30 tablet 1   atorvastatin (LIPITOR) 20 MG tablet Take 1 tablet (20 mg total) by mouth daily. 30 tablet 1   cyclobenzaprine (FLEXERIL) 10 MG tablet Take 1 tablet (10 mg total) by mouth at bedtime as needed for muscle spasms. 30 tablet 1   gabapentin (NEURONTIN) 300 MG capsule Take 2 capsules (600 mg total) by mouth at bedtime. 60 capsule 1   hydrochlorothiazide (HYDRODIURIL) 25 MG tablet Take 0.5 tablets (12.5 mg total) by mouth daily. 30 tablet 1   metFORMIN (GLUCOPHAGE) 850 MG tablet Take 1 tablet (850 mg total) by mouth 2 (two) times daily with a meal. 60 tablet 1   sertraline (ZOLOFT) 50 MG tablet Take 1 tablet (50 mg total) by mouth daily. 30 tablet 1   triamcinolone ointment (KENALOG) 0.1 % Apply 1 Application topically 2 (two) times daily. 80  g 1   Vitamin D, Ergocalciferol, (DRISDOL) 1.25 MG (50000 UNIT) CAPS capsule Take 1 capsule (50,000 Units total) by mouth every 7 (seven) days. 4 capsule 2    Labs  Lab Results:  Admission on 01/23/2022, Discharged on 01/25/2022  Component Date Value Ref Range Status   Sodium 01/23/2022 137  135 - 145 mmol/L Final   Potassium 01/23/2022 4.0  3.5 - 5.1 mmol/L Final   Chloride 01/23/2022 103  98 - 111 mmol/L Final   CO2 01/23/2022 23  22 - 32 mmol/L Final   Glucose, Bld 01/23/2022 123 (H)  70 - 99 mg/dL Final   Glucose reference range applies only to samples taken after fasting for at least 8 hours.   BUN 01/23/2022 11  6 - 20 mg/dL Final   Creatinine, Ser 01/23/2022 1.06  0.61 - 1.24 mg/dL Final   Calcium 96/08/5407 9.0  8.9 - 10.3 mg/dL Final   Total Protein 81/19/1478 6.5  6.5 - 8.1 g/dL Final   Albumin 29/56/2130 3.6  3.5 - 5.0 g/dL Final   AST 86/57/8469 22  15 - 41 U/L Final   ALT 01/23/2022 19  0 - 44 U/L Final   Alkaline Phosphatase 01/23/2022 73  38 - 126 U/L Final   Total Bilirubin 01/23/2022 0.9  0.3 - 1.2 mg/dL Final   GFR, Estimated 01/23/2022 >60  >60 mL/min Final   Comment: (NOTE) Calculated using the CKD-EPI Creatinine Equation (2021)    Anion gap 01/23/2022 11  5 - 15 Final   Performed at [facility], [address], [place], [place] [address]   Opiates 01/23/2022 NONE DETECTED  NONE DETECTED Final   Cocaine 01/23/2022 POSITIVE (A)  NONE DETECTED Final   Benzodiazepines 01/23/2022 NONE DETECTED  NONE DETECTED Final   Amphetamines 01/23/2022 NONE DETECTED  NONE DETECTED Final  Tetrahydrocannabinol 01/23/2022 NONE DETECTED  NONE DETECTED Final   Barbiturates 01/23/2022 NONE DETECTED  NONE DETECTED Final   Comment: (NOTE) DRUG SCREEN FOR MEDICAL PURPOSES ONLY.  IF CONFIRMATION IS NEEDED FOR ANY PURPOSE, NOTIFY LAB WITHIN 5 DAYS.  LOWEST DETECTABLE LIMITS FOR URINE DRUG SCREEN Drug Class                     Cutoff (ng/mL) Amphetamine and metabolites     1000 Barbiturate and metabolites    200 Benzodiazepine                 200 Tricyclics and metabolites     300 Opiates and metabolites        300 Cocaine and metabolites        300 THC                            50 Performed at [facility], [address], [place], [place] [address]    WBC 01/23/2022 22.3 (H)  4.0 - 10.5 K/uL Final   RBC 01/23/2022 5.39  4.22 - 5.81 MIL/uL Final   Hemoglobin 01/23/2022 14.7  13.0 - 17.0 g/dL Final   HCT 19/14/7829 45.6  39.0 - 52.0 % Final   MCV 01/23/2022 84.6  80.0 - 100.0 fL Final   MCH 01/23/2022 27.3  26.0 - 34.0 pg Final   MCHC 01/23/2022 32.2  30.0 - 36.0 g/dL Final   RDW 56/21/3086 16.0 (H)  11.5 - 15.5 % Final   Platelets 01/23/2022 429 (H)  150 - 400 K/uL Final   nRBC 01/23/2022 0.0  0.0 - 0.2 % Final   Neutrophils Relative % 01/23/2022 78  % Final   Neutro Abs 01/23/2022 17.6 (H)  1.7 - 7.7 K/uL Final   Lymphocytes Relative 01/23/2022 11  % Final   Lymphs Abs 01/23/2022 2.4  0.7 - 4.0 K/uL Final   Monocytes Relative 01/23/2022 8  % Final   Monocytes Absolute 01/23/2022 1.8 (H)  0.1 - 1.0 K/uL Final   Eosinophils Relative 01/23/2022 1  % Final   Eosinophils Absolute 01/23/2022 0.2  0.0 - 0.5 K/uL Final   Basophils Relative 01/23/2022 1  % Final   Basophils Absolute 01/23/2022 0.1  0.0 - 0.1 K/uL Final   Immature Granulocytes 01/23/2022 1  % Final   Abs Immature Granulocytes 01/23/2022 0.31 (H)  0.00 - 0.07 K/uL Final   Performed at [facility], [address] [place], [place] [address]   Alcohol, Ethyl (B) 01/23/2022 <10  <10 mg/dL Final   Comment: (NOTE) Lowest detectable limit for serum alcohol is 10 mg/dL.  For medical purposes only. Performed at [facility], [address] [place], [place] [address]    Salicylate Lvl 01/23/2022 <7.0 (L)  7.0 - 30.0 mg/dL Final   Performed at [facility], [address] [place], [place] [address]   Acetaminophen (Tylenol), Serum 01/23/2022 <10 (L)  10 - 30 ug/mL  Final   Comment: (NOTE) Therapeutic concentrations vary significantly. A range of 10-30 ug/mL  may be an effective concentration for many patients. However, some  are best treated at concentrations outside of this range. Acetaminophen concentrations >150 ug/mL at 4 hours after ingestion  and >50 ug/mL at 12 hours after ingestion are often associated with  toxic reactions.  Performed at [facility], [address] [place], [place] [address]  SARS Coronavirus 2 by RT PCR 01/23/2022 NEGATIVE  NEGATIVE Final   Comment: (NOTE) SARS-CoV-2 target nucleic acids are NOT DETECTED.  The SARS-CoV-2 RNA is generally detectable in upper and lower respiratory specimens during the acute phase of infection. The lowest concentration of SARS-CoV-2 viral copies this assay can detect is 250 copies / mL. A negative result does not preclude SARS-CoV-2 infection and should not be used as the sole basis for treatment or other patient management decisions.  A negative result may occur with improper specimen collection / handling, submission of specimen other than nasopharyngeal swab, presence of viral mutation(s) within the areas targeted by this assay, and inadequate number of viral copies (<250 copies / mL). A negative result must be combined with clinical observations, patient history, and epidemiological information.  Fact Sheet for Patients:   https://www.patel.info/  Fact Sheet for Healthcare Providers: https://hall.com/  This test is not yet approved or                           cleared by the [place] FDA and has been authorized for detection and/or diagnosis of SARS-CoV-2 by FDA under an Emergency Use Authorization (EUA).  This EUA will remain in effect (meaning this test can be used) for the duration of the COVID-19 declaration under Section 564(b)(1) of the Act, 21 U.S.C. section 360bbb-3(b)(1), unless the authorization is terminated  or revoked sooner.  Performed at [facility], [place] [address], [place], [place] [address]    Glucose-Capillary 01/23/2022 139 (H)  70 - 99 mg/dL Final   Glucose reference range applies only to samples taken after fasting for at least 8 hours.  Office Visit on 12/17/2021  Component Date Value Ref Range Status   Prostate Specific Ag, Serum 12/17/2021 0.5  0.0 - 4.0 ng/mL Final   Comment: Roche ECLIA methodology. According to the American Urological Association, Serum PSA should decrease and remain at undetectable levels after radical prostatectomy. The AUA defines biochemical recurrence as an initial PSA value 0.2 ng/mL or greater followed by a subsequent confirmatory PSA value 0.2 ng/mL or greater. Values obtained with different assay methods or kits cannot be used interchangeably. Results cannot be interpreted as absolute evidence of the presence or absence of malignant disease.    Vit D, 25-Hydroxy 12/17/2021 20.2 (L)  30.0 - 100.0 ng/mL Final   Comment: Vitamin D deficiency has been defined by the [place] practice guideline as a level of serum 25-OH vitamin D less than 20 ng/mL (1,2). The Endocrine Society went on to further define vitamin D insufficiency as a level between 21 and 29 ng/mL (2). 1. IOM (Institute of Medicine). 2010. Dietary reference    intakes for calcium and D. [place]: The    [employer]. 2. Holick MF, Binkley Higg[place], Bischoff-Ferrari HA, et al.    Evaluation, treatment, and prevention of vitamin D    deficiency: an Endocrine Society clinical practice    guideline. JCEM. 2011 Jul; 96(7):1911-30.    WBC 12/17/2021 15.8 (H)  3.4 - 10.8 x10E3/uL Final   RBC 12/17/2021 5.08  4.14 - 5.80 x10E6/uL Final   Hemoglobin 12/17/2021 14.0  13.0 - 17.7 g/dL Final   Hematocrit 12/17/2021 43.4  37.5 - 51.0 % Final   MCV 12/17/2021 85  79 - 97 fL Final   MCH 12/17/2021 27.6  26.6 - 33.0 pg Final   MCHC 12/17/2021  32.3  31.5 - 35.7  g/dL Final   RDW 42/59/5638 13.3  11.6 - 15.4 % Final   Platelets 12/17/2021 338  150 - 450 x10E3/uL Final   Neutrophils 12/17/2021 73  Not Estab. % Final   Lymphs 12/17/2021 14  Not Estab. % Final   Monocytes 12/17/2021 7  Not Estab. % Final   Eos 12/17/2021 5  Not Estab. % Final   Basos 12/17/2021 1  Not Estab. % Final   Neutrophils Absolute 12/17/2021 11.5 (H)  1.4 - 7.0 x10E3/uL Final   Lymphocytes Absolute 12/17/2021 2.3  0.7 - 3.1 x10E3/uL Final   Monocytes Absolute 12/17/2021 1.1 (H)  0.1 - 0.9 x10E3/uL Final   EOS (ABSOLUTE) 12/17/2021 0.8 (H)  0.0 - 0.4 x10E3/uL Final   Basophils Absolute 12/17/2021 0.1  0.0 - 0.2 x10E3/uL Final   Immature Granulocytes 12/17/2021 0  Not Estab. % Final   Immature Grans (Abs) 12/17/2021 0.0  0.0 - 0.1 x10E3/uL Final   Hemoglobin A1C 12/17/2021 6.8 (A)  4.0 - 5.6 % Final  Admission on 12/02/2021, Discharged on 12/02/2021  Component Date Value Ref Range Status   Glucose-Capillary 12/02/2021 210 (H)  70 - 99 mg/dL Final   Glucose reference range applies only to samples taken after fasting for at least 8 hours.   Sodium 12/02/2021 137  135 - 145 mmol/L Final   Potassium 12/02/2021 4.0  3.5 - 5.1 mmol/L Final   Chloride 12/02/2021 108  98 - 111 mmol/L Final   CO2 12/02/2021 26  22 - 32 mmol/L Final   Glucose, Bld 12/02/2021 206 (H)  70 - 99 mg/dL Final   Glucose reference range applies only to samples taken after fasting for at least 8 hours.   BUN 12/02/2021 11  6 - 20 mg/dL Final   Creatinine, Ser 12/02/2021 0.89  0.61 - 1.24 mg/dL Final   Calcium 75/64/3329 8.5 (L)  8.9 - 10.3 mg/dL Final   GFR, Estimated 12/02/2021 >60  >60 mL/min Final   Comment: (NOTE) Calculated using the CKD-EPI Creatinine Equation (2021)    Anion gap 12/02/2021 3 (L)  5 - 15 Final   Performed at [facility], [address] [place], [place] [address]   WBC 12/02/2021 15.1 (H)  4.0 - 10.5 K/uL Final   RBC 12/02/2021 4.82  4.22 - 5.81 MIL/uL  Final   Hemoglobin 12/02/2021 13.3  13.0 - 17.0 g/dL Final   HCT 16/60/6301 41.3  39.0 - 52.0 % Final   MCV 12/02/2021 85.7  80.0 - 100.0 fL Final   MCH 12/02/2021 27.6  26.0 - 34.0 pg Final   MCHC 12/02/2021 32.2  30.0 - 36.0 g/dL Final   RDW 60/02/9322 14.1  11.5 - 15.5 % Final   Platelets 12/02/2021 471 (H)  150 - 400 K/uL Final   nRBC 12/02/2021 0.0  0.0 - 0.2 % Final   Performed at [facility], [address] [place], [place] [address]   Color, Urine 12/02/2021 YELLOW  YELLOW Final   APPearance 12/02/2021 CLEAR  CLEAR Final   Specific Gravity, Urine 12/02/2021 1.020  1.005 - 1.030 Final   pH 12/02/2021 7.0  5.0 - 8.0 Final   Glucose, UA 12/02/2021 250 (A)  NEGATIVE mg/dL Final   Hgb urine dipstick 12/02/2021 NEGATIVE  NEGATIVE Final   Bilirubin Urine 12/02/2021 NEGATIVE  NEGATIVE Final   Ketones, ur 12/02/2021 NEGATIVE  NEGATIVE mg/dL Final   Protein, ur 20/25/4270 NEGATIVE  NEGATIVE mg/dL Final   Nitrite 62/37/6283 NEGATIVE  NEGATIVE Final   Leukocytes,Ua 12/02/2021 NEGATIVE  NEGATIVE Final   Comment: Microscopic not done on urines with negative protein, blood, leukocytes, nitrite, or glucose < 500 mg/dL. Performed at [facility], [address] [place], [place] [address]    Alcohol, Ethyl (B) 12/02/2021 <10  <10 mg/dL Final   Comment: (NOTE) Lowest detectable limit for serum alcohol is 10 mg/dL.  For medical purposes only. Performed at [facility], [address] [place], [place] [address]    Acetaminophen (Tylenol), Serum 12/02/2021 <10 (L)  10 - 30 ug/mL Final   Comment: (NOTE) Therapeutic concentrations vary significantly. A range of 10-30 ug/mL  may be an effective concentration for many patients. However, some  are best treated at concentrations outside of this range. Acetaminophen concentrations >150 ug/mL at 4 hours after ingestion  and >50 ug/mL at 12 hours after ingestion are often associated with  toxic  reactions.  Performed at [facility], [address] [place], [place] [address]    Salicylate Lvl 12/02/2021 <7.0 (L)  7.0 - 30.0 mg/dL Final   Performed at [facility], [address] [place], [place] [address]  Admission on 11/23/2021, Discharged on 11/23/2021  Component Date Value Ref Range Status   SARS Coronavirus 2 by RT PCR 11/23/2021 NEGATIVE  NEGATIVE Final   Comment: (NOTE) SARS-CoV-2 target nucleic acids are NOT DETECTED.  The SARS-CoV-2 RNA is generally detectable in upper respiratory specimens during the acute phase of infection. The lowest concentration of SARS-CoV-2 viral copies this assay can detect is 138 copies/mL. A negative result does not preclude SARS-Cov-2 infection and should not be used as the sole basis for treatment or other patient management decisions. A negative result may occur with  improper specimen collection/handling, submission of specimen other than nasopharyngeal swab, presence of viral mutation(s) within the areas targeted by this assay, and inadequate number of viral copies(<138 copies/mL). A negative result must be combined with clinical observations, patient history, and epidemiological information. The expected result is Negative.  Fact Sheet for Patients:  BloggerCourse.com  Fact Sheet for Healthcare Providers:  SeriousBroker.it  This test is no                          t yet approved or cleared by the [place] FDA and  has been authorized for detection and/or diagnosis of SARS-CoV-2 by FDA under an Emergency Use Authorization (EUA). This EUA will remain  in effect (meaning this test can be used) for the duration of the COVID-19 declaration under Section 564(b)(1) of the Act, 21 U.S.C.section 360bbb-3(b)(1), unless the authorization is terminated  or revoked sooner.       Influenza A by PCR 11/23/2021 NEGATIVE  NEGATIVE Final   Influenza B by PCR  11/23/2021 NEGATIVE  NEGATIVE Final   Comment: (NOTE) The Xpert Xpress SARS-CoV-2/FLU/RSV plus assay is intended as an aid in the diagnosis of influenza from Nasopharyngeal swab specimens and should not be used as a sole basis for treatment. Nasal washings and aspirates are unacceptable for Xpert Xpress SARS-CoV-2/FLU/RSV testing.  Fact Sheet for Patients: BloggerCourse.com  Fact Sheet for Healthcare Providers: SeriousBroker.it  This test is not yet approved or cleared by the [place] FDA and has been authorized for detection and/or diagnosis of SARS-CoV-2 by FDA under an Emergency Use Authorization (EUA). This EUA will remain in effect (meaning this test can be used) for the duration of  the COVID-19 declaration under Section 564(b)(1) of the Act, 21 U.S.C. section 360bbb-3(b)(1), unless the authorization is terminated or revoked.  Performed at [facility], [address] [place], [place] [address]    Sodium 11/23/2021 136  135 - 145 mmol/L Final   Potassium 11/23/2021 3.6  3.5 - 5.1 mmol/L Final   Chloride 11/23/2021 105  98 - 111 mmol/L Final   CO2 11/23/2021 20 (L)  22 - 32 mmol/L Final   Glucose, Bld 11/23/2021 122 (H)  70 - 99 mg/dL Final   Glucose reference range applies only to samples taken after fasting for at least 8 hours.   BUN 11/23/2021 16  6 - 20 mg/dL Final   Creatinine, Ser 11/23/2021 1.15  0.61 - 1.24 mg/dL Final   Calcium 41/66/0630 9.1  8.9 - 10.3 mg/dL Final   Total Protein 16/05/930 6.4 (L)  6.5 - 8.1 g/dL Final   Albumin 35/57/3220 3.5  3.5 - 5.0 g/dL Final   AST 25/42/7062 34  15 - 41 U/L Final   ALT 11/23/2021 23  0 - 44 U/L Final   Alkaline Phosphatase 11/23/2021 66  38 - 126 U/L Final   Total Bilirubin 11/23/2021 0.7  0.3 - 1.2 mg/dL Final   GFR, Estimated 11/23/2021 >60  >60 mL/min Final   Comment: (NOTE) Calculated using the CKD-EPI Creatinine Equation (2021)    Anion gap 11/23/2021  11  5 - 15 Final   Performed at [facility], [address] [place], [place] [address]   Alcohol, Ethyl (B) 11/23/2021 15 (H)  <10 mg/dL Final   Comment: (NOTE) Lowest detectable limit for serum alcohol is 10 mg/dL.  For medical purposes only. Performed at [facility], [address], [place], [place] [address]    Opiates 11/23/2021 NONE DETECTED  NONE DETECTED Final   Cocaine 11/23/2021 POSITIVE (A)  NONE DETECTED Final   Benzodiazepines 11/23/2021 NONE DETECTED  NONE DETECTED Final   Amphetamines 11/23/2021 NONE DETECTED  NONE DETECTED Final   Tetrahydrocannabinol 11/23/2021 POSITIVE (A)  NONE DETECTED Final   Barbiturates 11/23/2021 NONE DETECTED  NONE DETECTED Final   Comment: (NOTE) DRUG SCREEN FOR MEDICAL PURPOSES ONLY.  IF CONFIRMATION IS NEEDED FOR ANY PURPOSE, NOTIFY LAB WITHIN 5 DAYS.  LOWEST DETECTABLE LIMITS FOR URINE DRUG SCREEN Drug Class                     Cutoff (ng/mL) Amphetamine and metabolites    1000 Barbiturate and metabolites    200 Benzodiazepine                 200 Tricyclics and metabolites     300 Opiates and metabolites        300 Cocaine and metabolites        300 THC                            50 Performed at [facility], [address], [place], [place] [address]    WBC 11/23/2021 26.7 (H)  4.0 - 10.5 K/uL Final   RBC 11/23/2021 4.86  4.22 - 5.81 MIL/uL Final   Hemoglobin 11/23/2021 13.7  13.0 - 17.0 g/dL Final   HCT 37/02/6268 42.9  39.0 - 52.0 % Final   MCV 11/23/2021 88.3  80.0 - 100.0 fL Final   MCH 11/23/2021 28.2  26.0 - 34.0 pg Final   MCHC 11/23/2021 31.9  30.0 - 36.0 g/dL Final   RDW 78/29/5621 14.9  11.5 - 15.5 % Final   Platelets 11/23/2021 423 (H)  150 - 400 K/uL Final   nRBC 11/23/2021 0.0  0.0 - 0.2 % Final   Neutrophils Relative % 11/23/2021 87  % Final   Neutro Abs 11/23/2021 23.2 (H)  1.7 - 7.7 K/uL Final   Lymphocytes Relative 11/23/2021 9  % Final   Lymphs Abs 11/23/2021 2.4  0.7 - 4.0 K/uL Final    Monocytes Relative 11/23/2021 4  % Final   Monocytes Absolute 11/23/2021 1.1 (H)  0.1 - 1.0 K/uL Final   Eosinophils Relative 11/23/2021 0  % Final   Eosinophils Absolute 11/23/2021 0.0  0.0 - 0.5 K/uL Final   Basophils Relative 11/23/2021 0  % Final   Basophils Absolute 11/23/2021 0.0  0.0 - 0.1 K/uL Final   nRBC 11/23/2021 0  0 /100 WBC Final   Abs Immature Granulocytes 11/23/2021 0.00  0.00 - 0.07 K/uL Final   Polychromasia 11/23/2021 PRESENT   Final   Performed at [facility], [address], [place], [place] [address]  Admission on 10/11/2021, Discharged on 10/11/2021  Component Date Value Ref Range Status   WBC 10/11/2021 18.0 (H)  4.0 - 10.5 K/uL Final   RBC 10/11/2021 4.89  4.22 - 5.81 MIL/uL Final   Hemoglobin 10/11/2021 13.7  13.0 - 17.0 g/dL Final   HCT 78/46/9629 42.8  39.0 - 52.0 % Final   MCV 10/11/2021 87.5  80.0 - 100.0 fL Final   MCH 10/11/2021 28.0  26.0 - 34.0 pg Final   MCHC 10/11/2021 32.0  30.0 - 36.0 g/dL Final   RDW 52/84/1324 15.9 (H)  11.5 - 15.5 % Final   Platelets 10/11/2021 445 (H)  150 - 400 K/uL Final   nRBC 10/11/2021 0.0  0.0 - 0.2 % Final   Neutrophils Relative % 10/11/2021 79  % Final   Neutro Abs 10/11/2021 14.1 (H)  1.7 - 7.7 K/uL Final   Lymphocytes Relative 10/11/2021 15  % Final   Lymphs Abs 10/11/2021 2.6  0.7 - 4.0 K/uL Final   Monocytes Relative 10/11/2021 5  % Final   Monocytes Absolute 10/11/2021 0.9  0.1 - 1.0 K/uL Final   Eosinophils Relative 10/11/2021 1  % Final   Eosinophils Absolute 10/11/2021 0.2  0.0 - 0.5 K/uL Final   Basophils Relative 10/11/2021 0  % Final   Basophils Absolute 10/11/2021 0.1  0.0 - 0.1 K/uL Final   Immature Granulocytes 10/11/2021 0  % Final   Abs Immature Granulocytes 10/11/2021 0.08 (H)  0.00 - 0.07 K/uL Final   Performed at [facility], [address], [place], [place] [address]   Sodium 10/11/2021 139  135 - 145 mmol/L Final   Potassium 10/11/2021 3.8  3.5 - 5.1 mmol/L Final   Chloride  10/11/2021 107  98 - 111 mmol/L Final   CO2 10/11/2021 24  22 - 32 mmol/L Final   Glucose, Bld 10/11/2021 161 (H)  70 - 99 mg/dL Final   Glucose reference range applies only to samples taken after fasting for at least 8 hours.   BUN 10/11/2021 12  6 - 20 mg/dL Final   Creatinine, Ser 10/11/2021 0.99  0.61 - 1.24 mg/dL Final   Calcium 72/53/6644 8.7 (L)  8.9 - 10.3 mg/dL Final   GFR, Estimated 10/11/2021 >60  >60 mL/min Final   Comment: (NOTE) Calculated using the CKD-EPI Creatinine Equation (2021)  Anion gap 10/11/2021 8  5 - 15 Final   Performed at [facility], [address], [place], [place] [address]   TSH 10/11/2021 1.146  0.350 - 4.500 uIU/mL Final   Comment: Performed by a 3rd Generation assay with a functional sensitivity of <=0.01 uIU/mL. Performed at [facility], [address], [place], [place] [address]     Blood Alcohol level:  Lab Results  Component Value Date   ETH <10 01/23/2022   ETH <10 12/02/2021    Metabolic Disorder Labs: Lab Results  Component Value Date   HGBA1C 6.8 (A) 12/17/2021   No results found for: "PROLACTIN" Lab Results  Component Value Date   CHOL 185 12/05/2020   TRIG 76 12/05/2020   HDL 41 12/05/2020   CHOLHDL 4.5 12/05/2020   LDLCALC 130 (H) 12/05/2020    Therapeutic Lab Levels: No results found for: "LITHIUM" No results found for: "VALPROATE" No results found for: "CBMZ"  Physical Findings   GAD-7    Flowsheet Row Office Visit from 01/01/2022 in [facility] Office Visit from 12/05/2020 in [facility] at [facility] Office Visit from 11/20/2020 in [facility]  Total GAD-7 Score PHQ2-9    Flowsheet Row ED from 01/23/2022 in [facility] Office Visit from 01/01/2022 in [facility] Office Visit from 12/05/2020 in Primary [facility] at [facility] Office Visit from 11/20/2020 in [facility] Office Visit from 09/12/2020 in [facility]  PHQ-2 Total Score PHQ-9 Total Score Flowsheet Row ED from 01/25/2022 in [facility] ED from 01/23/2022 in [facility] EMERGENCY DEPARTMENT ED from 12/02/2021 in [facility] EM[facility]PARTMENT  C-SSRS RISK CATEGORY No Risk High Risk No Risk        Musculoskeletal  Strength & Muscle Tone: within normal limits Gait & Station: normal Patient leans: N/A  Psychiatric Specialty Exam  Presentation  General Appearance: Appropriate for Environment  Eye Contact:Fair  Speech:Clear and Coherent  Speech Volume:Normal  Handedness:Right   Mood and Affect  Mood:Dysphoric  Affect:Congruent   Thought Process  Thought Processes:Coherent; Goal Directed  Descriptions of Associations:Intact  Orientation:Full (Time, Place and Person)  Thought Content:Logical  Diagnosis of Schizophrenia or Schizoaffective disorder in past: No    Hallucinations:Hallucinations: None  Ideas of Reference:None  Suicidal Thoughts:Suicidal Thoughts: No SI Active Intent and/or Plan: With Plan  Homicidal Thoughts:Homicidal Thoughts: No   Sensorium  Memory:Immediate Fair  Judgment:Intact  Insight:Present   Executive Functions  Concentration:Fair  Attention Span:Fair  Recall:Fair  Fund of Knowledge:Fair  Language:Fair   Psychomotor Activity  Psychomotor Activity:Psychomotor Activity: Normal   Assets  Assets:Communication Skills; Desire for Improvement; Financial Resources/Insurance; Leisure Time; Physical Health; Resilience   Sleep  Sleep:Sleep: Fair   Nutritional Assessment (For OBS and FBC admissions only) Has the patient had a weight loss or gain of 10 pounds or more in the last 3 months?: No Has the patient had a decrease in food intake/or appetite?: No Does the patient have dental problems?: No Does the patient have eating habits or behaviors that may be indicators  of an eating disorder including binging or inducing vomiting?: No Has the patient recently lost weight without trying?: 0 Has the patient been eating poorly because of a decreased appetite?: 0 Malnutrition  Screening Tool Score: 0    Physical Exam  Physical Exam HENT:     Head: Normocephalic.     Nose: Nose normal.  Eyes:     Conjunctiva/sclera: Conjunctivae normal.  Cardiovascular:     Rate and Rhythm: Normal rate.  Pulmonary:     Effort: Pulmonary effort is normal.  Musculoskeletal:        General: Normal range of motion.     Cervical back: Normal range of motion.  Skin:    Comments: Atopic dermatitis to left elbow and dry skin to lower legs.   Neurological:     Mental Status: He is alert and oriented to person, place, and time.    Review of Systems  Constitutional: Negative.   HENT: Negative.    Eyes: Negative.   Respiratory: Negative.    Cardiovascular: Negative.   Gastrointestinal: Negative.   Genitourinary: Negative.   Musculoskeletal: Negative.   Skin:        Eczema to legs and left arm. Itchy elbow   Neurological: Negative.   Endo/Heme/Allergies: Negative.    Blood pressure 121/60, pulse 73, temperature 98.8 F (37.1 C), temperature source Oral, resp. rate 18, SpO2 100 %. There is no height or weight on file to calculate BMI.  Treatment Plan Summary: Patient admitted to the [facility] behavioral health facility based crisis unit for substance abuse treatment and mood stabilization.  Patient is voluntary.  Patient UDS positive for cocaine.  Medications:  Continue Zoloft 50 mg p.o. daily for mood disorder Continue gabapentin 600 mg p.o. at bedtime for off-label use for cocaine use and anxiety Continue melatonin 5 mg p.o. nightly as needed for sleep And triamcinolone and Eucerin cream for atopic dermatitis  Demitra Danley L, NP 01/26/2022 10:51 AM

## 2022-01-26 NOTE — ED Notes (Signed)
Pt sitting in dining room watching TV. A&O x4, calm and cooperative. Denies current SI/HI/AVH. No signs of distress noted. Monitoring for safety.  

## 2022-01-26 NOTE — ED Notes (Signed)
Patient has been calm and cooperative throughout the day. Patient has spent his time in the dayroom watching television. Patient has taken his medications and ate both breakfast and lunch. Patient has denied SI/HI and AVH. Patient is being monitored for safety.

## 2022-01-27 NOTE — ED Notes (Signed)
Patient was sleeping in his room when writer checked on him. Patient denies SI/HI and AVH. Patient is being monitored for safety.

## 2022-01-27 NOTE — ED Notes (Signed)
Pt is in his room quietly lying in his bed, no distress noted will continue to monitor for safety

## 2022-01-27 NOTE — ED Notes (Signed)
Pt came to this nurse stating he is wanting to leave, he has some stuff he needs to take care of. Advised I will make the provider aware.

## 2022-01-27 NOTE — ED Notes (Signed)
Pt is currently sleeping, no distress noted, environmental check complete, will continue to monitor patient for safety. ? ?

## 2022-01-27 NOTE — Discharge Instructions (Addendum)
Discharge recommendations:  Patient is to take medications as prescribed. Please see information for follow-up appointment with psychiatry and therapy. Please follow up with your primary care provider for all medical related needs.   Therapy: We recommend that patient participate in individual therapy to address mental health concerns.  Medications: The patient is to contact a medical professional and/or outpatient provider to address any new side effects that develop. The patient should update outpatient providers of any new medications and/or medication changes.   Safety:  The patient should abstain from use of illicit substances/drugs and abuse of any medications. If symptoms worsen or do not continue to improve or if the patient becomes actively suicidal or homicidal then it is recommended that the patient return to the closest hospital emergency department, the [facility], or call 911 for further evaluation and treatment. National Suicide Prevention Lifeline 1-800-SUICIDE or (408)481-7127.  About 988 988 offers 24/7 access to trained crisis counselors who can help people experiencing mental health-related distress. People can call or text 988 or chat 988lifeline.org for themselves or if they are worried about a loved one who may need crisis support.   You are encouraged to follow up with [facility] for outpatient treatment.  Walk in/ Open Access Hours: Monday - Friday 8AM - 11AM (To see provider and therapist) Friday - [place] (To see therapist only)  [facility] [place], [place]   Please contact one of the following facilities to start medication management and therapy services:   [facility] [address]  [place], [place] [address] [phone]   [place]  [address] [place], [place] [address] [phone]  [place]  [address] [place], [place] [address] [phone]  [facility]  [address] [place]  [place], [place] [address] [phone]  [facility]  [address] [place], [place] [address] [phone]  [place]  [place] [address],  [place], [place] [address] [phone]

## 2022-01-27 NOTE — ED Notes (Signed)
Pt asleep in bed. Respirations even and unlabored. Monitoring for safety. 

## 2022-01-27 NOTE — ED Provider Notes (Addendum)
FBC/OBS ASAP Discharge Summary  Date and Time: 01/27/2022 12:22 PM  Name: Jeffery Wheeler  MRN:  [id number]   Discharge Diagnoses:  Final diagnoses:  Substance induced mood disorder (HCC)  Severe episode of recurrent major depressive disorder, without psychotic features (HCC)  Polysubstance abuse (HCC)  Crack cocaine use    Subjective: Patient seen and evaluated face-to-face by this provider, chart reviewed and case discussed with Dr. Gasper Sellsinderella. On evaluation, patient is alert and oriented x 4. His thought process is logical and speech is clear and coherent. His mood is anxious and affect is congruent. Patient denies suicidal ideations. He denies homicidal ideations. He denies auditory or visual hallucinations. There is no objective evidence that the patient is currently responding to internal or external stimuli. He reports feeling a little depressed this morning and states that the melatonin did not do anything for him sleep last night. He states that last night he could not stop thinking about how he is messing up his life by using drugs. He rates his anxiety 5 out of 10 with 10 being the worst. He is compliant with current medication regimen and denies any side effects. He denies physical complaints.  Stay Summary: HPI: Per chart review: [facility] c/o "I got a drug problem. I've been fighting for quite a while." He states he uses crack "as much as I can get", with "breaks in between", last use was on Tuesday. He reports he also uses alcohol, 2 to 3 40oz/day, with "breaks in between", last use was on Tuesday. He states he was living with his elderly aunt, although after using substances on Tuesday, she would not let him back into the home on Wednesday morning. He denies current SI/VI/HI, AVH, paranoia. He denies any plan or intent to act on a plan. He states he needs help with his substance use before his mood worsens and he begins to experience SI w/ a plan. He  reports history of 1 SA earlier this year, used fentanyl for the first time to try to kill himself. He denies he is using fentanyl/opioids now.  Patient was admitted to the facility based crisis unit for a mood stabilization and substance abuse treatment on 01/25/22. UDS positive for cocaine. During course of treatment, the patient was restarted back on his home medications Norvasc 10 mg p.o. daily, Lipitor 20 mg p.o. daily, gabapentin 600 mg nightly, metformin 850 mg p.o. twice daily and Zoloft 50 mg p.o. daily. Patient has tolerated current medication regimen without any noticeable side effects. Patient has to denies significant withdrawal symptoms during course of treatment.  Patient initially presented with a goal of trying to get into residential long-term substance abuse treatment in [place]. However, today on 01/27/2022, he is requesting to be discharged to return back to his aunt house. He states that his aunt needs him. He does not further elaborate. He requested outpatient resources for psychiatry, therapy and substance abuse treatment. Patient denise access to weapons. Patient advised that if his symptoms worsen or he's experiencing active suicidal thoughts to go to the nearest [facility] urgent care, nearest ED or call 911 for an evaluation. Patient does not meet [place] IVC criteria. Patient discharged with outpatient resources for psychiatry and substance abuse treatment.   Total Time spent with patient: 30 minutes  Past Psychiatric History: Per chart review from H&P.History of substance induced mood, cocaine abuse, and MDD. He reports hx of 1 SA "earlier this year". Used fentanyl to try to kill himself.  He reports hx of 1 inpatient psychiatric hospitalization following SA at [facility]. Past Medical History:  Past Medical History:  Diagnosis Date   Eczema    HLD (hyperlipidemia)    HTN (hypertension)    Type 2 diabetes mellitus (HCC)     Past Surgical History:  Procedure Laterality Date    CERVICAL SPINE SURGERY  2018   right shoulder  1991   Family History:  Family History  Problem Relation Age of Onset   Diabetes type II Sister    Diabetes type II Paternal Grandmother    Diabetes type II Maternal Aunt    Family Psychiatric History:  Mother abused cocaine. His father died by OD on cocaine.  Social History:  Social History   Substance and Sexual Activity  Alcohol Use Not Currently     Social History   Substance and Sexual Activity  Drug Use Not Currently    Social History   Socioeconomic History   Marital status: Single    Spouse name: Not on file   Number of children: Not on file   Years of education: Not on file   Highest education level: Not on file  Occupational History   Not on file  Tobacco Use   Smoking status: Never   Smokeless tobacco: Never  Substance and Sexual Activity   Alcohol use: Not Currently   Drug use: Not Currently   Sexual activity: Yes  Other Topics Concern   Not on file  Social History Narrative   ** Merged History Encounter **       Social Determinants of Health   Financial Resource Strain: Not on file  Food Insecurity: Not on file  Transportation Needs: Not on file  Physical Activity: Not on file  Stress: Not on file  Social Connections: Not on file   SDOH:  SDOH Screenings   Depression (PHQ2-9): High Risk (01/24/2022)  Tobacco Use: Low Risk  (01/03/2022)  Recent Concern: Tobacco Use - High Risk (10/11/2021)    Tobacco Cessation:  N/A, patient does not currently use tobacco products  Current Medications:  Current Facility-Administered Medications  Medication Dose Route Frequency Provider Last Rate Last Admin   acetaminophen (TYLENOL) tablet 650 mg  650 mg Oral Q6H PRN Onuoha, Chinwendu V, NP       alum & mag hydroxide-simeth (MAALOX/MYLANTA) 200-200-20 MG/5ML suspension 30 mL  30 mL Oral Q4H PRN Onuoha, Chinwendu V, NP       amLODipine (NORVASC) tablet 10 mg  10 mg Oral Daily Onuoha, Chinwendu V, NP   10 mg at  01/27/22 0958   atorvastatin (LIPITOR) tablet 20 mg  20 mg Oral Daily Onuoha, Chinwendu V, NP   20 mg at 01/27/22 0958   dicyclomine (BENTYL) tablet 20 mg  20 mg Oral Q6H PRN Onuoha, Chinwendu V, NP       gabapentin (NEURONTIN) capsule 600 mg  600 mg Oral QHS Onuoha, Chinwendu V, NP   600 mg at 01/26/22 2113   hydrocerin (EUCERIN) cream   Topical BID PRN Lynden Carrithers L, NP       hydrOXYzine (ATARAX) tablet 25 mg  25 mg Oral Q6H PRN Onuoha, Chinwendu V, NP       ibuprofen (ADVIL) tablet 200 mg  200 mg Oral Q6H PRN Princess Bruins, DO   200 mg at 01/25/22 1059   loperamide (IMODIUM) capsule 2-4 mg  2-4 mg Oral PRN Onuoha, Chinwendu V, NP       magnesium hydroxide (MILK OF MAGNESIA) suspension  30 mL  30 mL Oral Daily PRN Onuoha, Chinwendu V, NP       melatonin tablet 5 mg  5 mg Oral QHS PRN Onuoha, Chinwendu V, NP   5 mg at 01/26/22 2113   metFORMIN (GLUCOPHAGE) tablet 850 mg  850 mg Oral BID WC Onuoha, Chinwendu V, NP   850 mg at 01/27/22 0841   methocarbamol (ROBAXIN) tablet 500 mg  500 mg Oral Q8H PRN Onuoha, Chinwendu V, NP       multivitamin with minerals tablet 1 tablet  1 tablet Oral Daily Onuoha, Chinwendu V, NP   1 tablet at 01/27/22 0958   ondansetron (ZOFRAN-ODT) disintegrating tablet 4 mg  4 mg Oral Q6H PRN Onuoha, Chinwendu V, NP       sertraline (ZOLOFT) tablet 50 mg  50 mg Oral Daily Onuoha, Chinwendu V, NP   50 mg at 01/27/22 0958   triamcinolone cream (KENALOG) 0.1 % cream   Topical BID Adriene Padula L, NP   1 Application at 01/27/22 3875   Current Outpatient Medications  Medication Sig Dispense Refill   amLODipine (NORVASC) 10 MG tablet Take 1 tablet (10 mg total) by mouth daily. 30 tablet 1   atorvastatin (LIPITOR) 20 MG tablet Take 1 tablet (20 mg total) by mouth daily. 30 tablet 1   gabapentin (NEURONTIN) 300 MG capsule Take 2 capsules (600 mg total) by mouth at bedtime. 60 capsule 1   hydrochlorothiazide (HYDRODIURIL) 25 MG tablet Take 0.5 tablets (12.5 mg total) by mouth  daily. 30 tablet 1   metFORMIN (GLUCOPHAGE) 850 MG tablet Take 1 tablet (850 mg total) by mouth 2 (two) times daily with a meal. 60 tablet 1   sertraline (ZOLOFT) 50 MG tablet Take 1 tablet (50 mg total) by mouth daily. 30 tablet 1   triamcinolone ointment (KENALOG) 0.1 % Apply 1 Application topically 2 (two) times daily. 80 g 1    PTA Medications: (Not in a hospital admission)      01/24/2022   10:05 AM 01/01/2022   12:45 PM 12/05/2020    9:52 AM  Depression screen PHQ 2/9  Decreased Interest 2 3 1   Down, Depressed, Hopeless 2 3 2   PHQ - 2 Score 4 6 3   Altered sleeping 3 3 2   Tired, decreased energy 2 2 2   Change in appetite 1 3 0  Feeling bad or failure about yourself  2 0 2  Trouble concentrating 1 0 2  Moving slowly or fidgety/restless 0 2 2  Suicidal thoughts 2 0 0  PHQ-9 Score 15 16 13   Difficult doing work/chores  Very difficult     Flowsheet Row ED from 01/25/2022 in [facility] ED from 01/23/2022 in [facility] ED from 12/02/2021 in [facility] EM[facility]GENCY DEPARTMENT  C-SSRS RISK CATEGORY No Risk High Risk No Risk       Musculoskeletal  Strength & Muscle Tone: within normal limits Gait & Station: normal Patient leans: N/A  Psychiatric Specialty Exam  Presentation  General Appearance: Appropriate for Environment  Eye Contact:Fair  Speech:Clear and Coherent  Speech Volume:Normal  Handedness:Right   Mood and Affect  Mood:Depressed; Anxious  Affect:Congruent   Thought Process  Thought Processes:Coherent  Descriptions of Associations:Intact  Orientation:Full (Time, Place and Person)  Thought Content:Logical  Diagnosis of Schizophrenia or Schizoaffective disorder in past: No    Hallucinations:Hallucinations: None  Ideas of Reference:None  Suicidal Thoughts:Suicidal Thoughts: No  Homicidal Thoughts:Homicidal Thoughts: No   Sensorium  Memory:Immediate Fair; Recent Fair;  Remote Fair  Judgment:Fair  Insight:Fair   Executive Functions  Concentration:Fair  Attention Span:Fair  [place]   Psychomotor Activity  Psychomotor Activity:Psychomotor Activity: Normal   Assets  Assets:Communication Skills; Desire for Improvement; Financial Resources/Insurance; Leisure Time; Physical Health; Resilience   Sleep  Sleep:Sleep: Poor  Physical Exam  Physical Exam HENT:     Head: Normocephalic.     Nose: Nose normal.  Eyes:     Conjunctiva/sclera: Conjunctivae normal.  Cardiovascular:     Rate and Rhythm: Normal rate.  Pulmonary:     Effort: Pulmonary effort is normal.  Musculoskeletal:        General: Normal range of motion.     Cervical back: Normal range of motion.  Neurological:     Mental Status: He is alert and oriented to person, place, and time.    Review of Systems  Constitutional: Negative.   HENT: Negative.    Eyes: Negative.   Respiratory: Negative.    Cardiovascular: Negative.   Gastrointestinal: Negative.   Genitourinary: Negative.   Musculoskeletal: Negative.   Skin: Negative.   Neurological: Negative.   Endo/Heme/Allergies: Negative.    Blood pressure (!) 150/73, pulse 69, temperature 98.9 F (37.2 C), temperature source Tympanic, resp. rate 18, SpO2 98 %. There is no height or weight on file to calculate BMI.  Demographic Factors:  Male, Low socioeconomic status, and Unemployed  Loss Factors: Financial problems/change in socioeconomic status  Historical Factors: Family history of mental illness or substance abuse  Risk Reduction Factors:   Sense of responsibility to family, Living with another person, especially a relative, and Positive social support  Continued Clinical Symptoms:  Previous Psychiatric Diagnoses and Treatments  Cognitive Features That Contribute To Risk:  None    Suicide Risk:  Minimal: No identifiable suicidal ideation.  Patients presenting with  no risk factors but with morbid ruminations; may be classified as minimal risk based on the severity of the depressive symptoms  Plan Of Care/Follow-up recommendations:  Activity:  as tolerated.  Discharge recommendations:  Patient is to take medications as prescribed. Please see information for follow-up appointment with psychiatry and therapy. Please follow up with your primary care provider for all medical related needs.   Therapy: We recommend that patient participate in individual therapy to address mental health concerns.  Medications: The patient is to contact a medical professional and/or outpatient provider to address any new side effects that develop. The patient should update outpatient providers of any new medications and/or medication changes.   Safety:  The patient should abstain from use of illicit substances/drugs and abuse of any medications. If symptoms worsen or do not continue to improve or if the patient becomes actively suicidal or homicidal then it is recommended that the patient return to the closest hospital emergency department, the [facility], or call 911 for further evaluation and treatment. National Suicide Prevention Lifeline 1-800-SUICIDE or (938)440-0843.  About 988 988 offers 24/7 access to trained crisis counselors who can help people experiencing mental health-related distress. People can call or text 988 or chat 988lifeline.org for themselves or if they are worried about a loved one who may need crisis support.   You are encouraged to follow up with [facility] for outpatient treatment.  Walk in/ Open Access Hours: Monday - Friday 8AM - 11AM (To see provider and therapist) Friday - [place] (To see therapist only)  [facility]  [address] [place], [place] [phone]   Please contact one of the following facilities to start medication management and therapy services:    [facility] [address] [place] [address]  [place], [place] [address] [phone]   [facility]  [address] [place], [place] [address] [phone]  [facility]  [address] Vella Raring [place], [place] [address] [phone]  [facility]  [address]  [place], [place] [address] [phone]  [facility]  [address] [place], [place] [address] [phone]  [facility]  [address],  [place], [place] [address] [phone]    Disposition: discharge to self.    Layla Barter, NP 01/27/2022, 12:22 PM

## 2022-01-27 NOTE — ED Notes (Signed)
Patient was discharged today from [facility] by provider. Patient has been given community resources in his discharge instructions.

## 2022-02-02 ENCOUNTER — Ambulatory Visit (HOSPITAL_COMMUNITY)
Admission: EM | Admit: 2022-02-02 | Discharge: 2022-02-02 | Disposition: A | Discharge status: Home / Self Care | Payer: Commercial Managed Care - HMO | Attending: Internal Medicine | Admitting: Internal Medicine

## 2022-02-02 ENCOUNTER — Inpatient Hospital Stay (HOSPITAL_COMMUNITY): Admission: RE | Admit: 2022-02-02 | Payer: Self-pay | Source: Ambulatory Visit

## 2022-02-02 ENCOUNTER — Encounter (HOSPITAL_COMMUNITY): Payer: Self-pay | Admitting: *Deleted

## 2022-02-02 DIAGNOSIS — Z77098 Contact with and (suspected) exposure to other hazardous, chiefly nonmedicinal, chemicals: Secondary | ICD-10-CM | POA: Diagnosis not present

## 2022-02-02 DIAGNOSIS — E11628 Type 2 diabetes mellitus with other skin complications: Secondary | ICD-10-CM

## 2022-02-02 DIAGNOSIS — T2114XA Burn of first degree of lower back, initial encounter: Secondary | ICD-10-CM | POA: Diagnosis not present

## 2022-02-02 MED ORDER — BACITRACIN ZINC 500 UNIT/GM EX OINT: TOPICAL_OINTMENT | Freq: Once | CUTANEOUS | Status: AC

## 2022-02-02 MED ORDER — BACITRACIN ZINC 500 UNIT/GM EX OINT
TOPICAL_OINTMENT | CUTANEOUS | Status: AC
  Filled 2022-02-02: qty 28.35

## 2022-02-02 MED ORDER — MUPIROCIN 2 % EX OINT: 1.0000 | TOPICAL_OINTMENT | Freq: Two times a day (BID) | CUTANEOUS | 0 refills | Status: DC

## 2022-02-02 NOTE — Discharge Instructions (Addendum)
Apply mupirocin ointment to the wound on your back twice daily and change the bandage twice daily as well with nonstick gauze and tape as performed in the clinic today.  Keep the wound clean and dry.  Wash the wound gently with soap and warm water.  Return to urgent care or go to your primary care provider in the next 4 to 5 days for reevaluation to ensure that wound is healing appropriately as you are a diabetic and are at risk for delayed wound healing.   If you develop any new or worsening symptoms or do not improve in the next 2 to 3 days, please return.  If your symptoms are severe, please go to the emergency room.  Follow-up with your primary care provider for further evaluation and management of your symptoms as well as ongoing wellness visits.  I hope you feel better!
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
## 2022-02-06 ENCOUNTER — Other Ambulatory Visit: Payer: Self-pay | Admitting: Physician Assistant

## 2022-02-06 ENCOUNTER — Other Ambulatory Visit (HOSPITAL_COMMUNITY): Payer: Self-pay

## 2022-02-06 ENCOUNTER — Ambulatory Visit: Payer: Self-pay

## 2022-02-06 DIAGNOSIS — E785 Hyperlipidemia, unspecified: Secondary | ICD-10-CM

## 2022-02-06 DIAGNOSIS — E119 Type 2 diabetes mellitus without complications: Secondary | ICD-10-CM

## 2022-02-06 DIAGNOSIS — I10 Essential (primary) hypertension: Secondary | ICD-10-CM

## 2022-02-06 DIAGNOSIS — F3341 Major depressive disorder, recurrent, in partial remission: Secondary | ICD-10-CM

## 2022-02-06 DIAGNOSIS — M25512 Pain in left shoulder: Secondary | ICD-10-CM

## 2022-02-06 MED ORDER — AMLODIPINE BESYLATE 10 MG PO TABS
10.0000 mg | ORAL_TABLET | Freq: Every day | ORAL | 0 refills | Status: DC
  Filled 2022-02-06: qty 90, 90d supply, fill #0

## 2022-02-06 MED ORDER — GABAPENTIN 300 MG PO CAPS
600.0000 mg | ORAL_CAPSULE | Freq: Every day | ORAL | 0 refills | Status: DC
  Filled 2022-02-06: qty 180, 90d supply, fill #0

## 2022-02-06 MED ORDER — HYDROCHLOROTHIAZIDE 25 MG PO TABS
12.5000 mg | ORAL_TABLET | Freq: Every day | ORAL | 0 refills | Status: DC
  Filled 2022-02-06: qty 45, 90d supply, fill #0

## 2022-02-06 MED ORDER — METFORMIN HCL 850 MG PO TABS
850.0000 mg | ORAL_TABLET | Freq: Two times a day (BID) | ORAL | 0 refills | Status: DC
  Filled 2022-02-06: qty 180, 90d supply, fill #0

## 2022-02-06 MED ORDER — ATORVASTATIN CALCIUM 20 MG PO TABS
20.0000 mg | ORAL_TABLET | Freq: Every day | ORAL | 0 refills | Status: DC
  Filled 2022-02-06: qty 90, 90d supply, fill #0

## 2022-02-06 MED ORDER — SERTRALINE HCL 50 MG PO TABS
50.0000 mg | ORAL_TABLET | Freq: Every day | ORAL | 0 refills | Status: DC
  Filled 2022-02-06: qty 90, 90d supply, fill #0

## 2022-02-06 NOTE — Progress Notes (Signed)
Chang to [facility] and to 90 day supply

## 2022-02-08 ENCOUNTER — Ambulatory Visit (HOSPITAL_COMMUNITY): Payer: Commercial Managed Care - HMO

## 2022-02-11 ENCOUNTER — Other Ambulatory Visit: Payer: Self-pay

## 2022-02-17 ENCOUNTER — Ambulatory Visit (HOSPITAL_COMMUNITY): Payer: Self-pay

## 2022-02-21 ENCOUNTER — Encounter (HOSPITAL_COMMUNITY): Payer: Self-pay

## 2022-02-21 ENCOUNTER — Other Ambulatory Visit: Payer: Self-pay

## 2022-02-21 ENCOUNTER — Encounter (HOSPITAL_COMMUNITY): Payer: Self-pay | Admitting: Nurse Practitioner

## 2022-02-21 ENCOUNTER — Inpatient Hospital Stay (HOSPITAL_COMMUNITY)
Admission: AD | Admit: 2022-02-21 | Discharge: 2022-02-28 | DRG: 885 | Disposition: A | Discharge status: Home / Self Care | Payer: Commercial Managed Care - HMO | Source: Intra-hospital | Attending: Psychiatry | Admitting: Psychiatry

## 2022-02-21 ENCOUNTER — Emergency Department (EMERGENCY_DEPARTMENT_HOSPITAL)
Admission: EM | Admit: 2022-02-21 | Discharge: 2022-02-21 | Disposition: A | Discharge status: Other Acute Inpatient Hospital | Payer: Commercial Managed Care - HMO | Source: Home / Self Care | Attending: Emergency Medicine | Admitting: Emergency Medicine

## 2022-02-21 DIAGNOSIS — E7800 Pure hypercholesterolemia, unspecified: Secondary | ICD-10-CM | POA: Diagnosis present

## 2022-02-21 DIAGNOSIS — Z9151 Personal history of suicidal behavior: Secondary | ICD-10-CM

## 2022-02-21 DIAGNOSIS — Z91148 Patient's other noncompliance with medication regimen for other reason: Secondary | ICD-10-CM | POA: Diagnosis not present

## 2022-02-21 DIAGNOSIS — E119 Type 2 diabetes mellitus without complications: Secondary | ICD-10-CM | POA: Insufficient documentation

## 2022-02-21 DIAGNOSIS — F1410 Cocaine abuse, uncomplicated: Secondary | ICD-10-CM | POA: Diagnosis present

## 2022-02-21 DIAGNOSIS — Z5900 Homelessness unspecified: Secondary | ICD-10-CM

## 2022-02-21 DIAGNOSIS — F1420 Cocaine dependence, uncomplicated: Secondary | ICD-10-CM | POA: Insufficient documentation

## 2022-02-21 DIAGNOSIS — L209 Atopic dermatitis, unspecified: Secondary | ICD-10-CM | POA: Diagnosis present

## 2022-02-21 DIAGNOSIS — F332 Major depressive disorder, recurrent severe without psychotic features: Secondary | ICD-10-CM | POA: Diagnosis present

## 2022-02-21 DIAGNOSIS — F10229 Alcohol dependence with intoxication, unspecified: Secondary | ICD-10-CM | POA: Diagnosis present

## 2022-02-21 DIAGNOSIS — Z7984 Long term (current) use of oral hypoglycemic drugs: Secondary | ICD-10-CM

## 2022-02-21 DIAGNOSIS — I10 Essential (primary) hypertension: Secondary | ICD-10-CM | POA: Diagnosis present

## 2022-02-21 DIAGNOSIS — Z555 Less than a high school diploma: Secondary | ICD-10-CM | POA: Diagnosis not present

## 2022-02-21 DIAGNOSIS — F1494 Cocaine use, unspecified with cocaine-induced mood disorder: Secondary | ICD-10-CM | POA: Diagnosis not present

## 2022-02-21 DIAGNOSIS — F17210 Nicotine dependence, cigarettes, uncomplicated: Secondary | ICD-10-CM | POA: Diagnosis present

## 2022-02-21 DIAGNOSIS — Z91040 Latex allergy status: Secondary | ICD-10-CM | POA: Insufficient documentation

## 2022-02-21 DIAGNOSIS — K30 Functional dyspepsia: Secondary | ICD-10-CM | POA: Diagnosis present

## 2022-02-21 DIAGNOSIS — R45851 Suicidal ideations: Secondary | ICD-10-CM | POA: Insufficient documentation

## 2022-02-21 DIAGNOSIS — K5900 Constipation, unspecified: Secondary | ICD-10-CM | POA: Diagnosis present

## 2022-02-21 DIAGNOSIS — F411 Generalized anxiety disorder: Secondary | ICD-10-CM | POA: Diagnosis present

## 2022-02-21 DIAGNOSIS — G4700 Insomnia, unspecified: Secondary | ICD-10-CM | POA: Diagnosis present

## 2022-02-21 DIAGNOSIS — F1414 Cocaine abuse with cocaine-induced mood disorder: Secondary | ICD-10-CM | POA: Insufficient documentation

## 2022-02-21 DIAGNOSIS — L309 Dermatitis, unspecified: Secondary | ICD-10-CM | POA: Diagnosis present

## 2022-02-21 DIAGNOSIS — Z79899 Other long term (current) drug therapy: Secondary | ICD-10-CM

## 2022-02-21 DIAGNOSIS — E785 Hyperlipidemia, unspecified: Secondary | ICD-10-CM

## 2022-02-21 DIAGNOSIS — F1090 Alcohol use, unspecified, uncomplicated: Secondary | ICD-10-CM | POA: Insufficient documentation

## 2022-02-21 DIAGNOSIS — F17200 Nicotine dependence, unspecified, uncomplicated: Secondary | ICD-10-CM | POA: Diagnosis present

## 2022-02-21 DIAGNOSIS — Z1152 Encounter for screening for COVID-19: Secondary | ICD-10-CM | POA: Insufficient documentation

## 2022-02-21 LAB — COMPREHENSIVE METABOLIC PANEL
ALT: 18 U/L (ref 0–44)
AST: 21 U/L (ref 15–41)
Albumin: 3.5 g/dL (ref 3.5–5.0)
Alkaline Phosphatase: 67 U/L (ref 38–126)
Anion gap: 8 (ref 5–15)
BUN: 15 mg/dL (ref 6–20)
CO2: 23 mmol/L (ref 22–32)
Calcium: 8.6 mg/dL — ABNORMAL LOW (ref 8.9–10.3)
Chloride: 105 mmol/L (ref 98–111)
Creatinine, Ser: 1.09 mg/dL (ref 0.61–1.24)
GFR, Estimated: >60 mL/min (ref >60)
Glucose, Bld: 111 mg/dL — ABNORMAL HIGH (ref 70–99)
Potassium: 4 mmol/L (ref 3.5–5.1)
Sodium: 136 mmol/L (ref 135–145)
Total Bilirubin: 0.8 mg/dL (ref 0.3–1.2)
Total Protein: 6.3 g/dL — ABNORMAL LOW (ref 6.5–8.1)

## 2022-02-21 LAB — CBC WITH DIFFERENTIAL/PLATELET
Abs Immature Granulocytes: 0.17 10*3/uL — ABNORMAL HIGH (ref 0.00–0.07)
Basophils Absolute: 0.1 10*3/uL (ref 0.0–0.1)
Basophils Relative: 1 %
Eosinophils Absolute: 0.2 10*3/uL (ref 0.0–0.5)
Eosinophils Relative: 1 %
HCT: 43.4 % (ref 39.0–52.0)
Hemoglobin: 14.1 g/dL (ref 13.0–17.0)
Immature Granulocytes: 1 %
Lymphocytes Relative: 9 %
Lymphs Abs: 2.4 10*3/uL (ref 0.7–4.0)
MCH: 27.3 pg (ref 26.0–34.0)
MCHC: 32.5 g/dL (ref 30.0–36.0)
MCV: 84.1 fL (ref 80.0–100.0)
Monocytes Absolute: 1.6 10*3/uL — ABNORMAL HIGH (ref 0.1–1.0)
Monocytes Relative: 6 %
Neutro Abs: 22 10*3/uL — ABNORMAL HIGH (ref 1.7–7.7)
Neutrophils Relative %: 82 %
Platelets: 347 10*3/uL (ref 150–400)
RBC: 5.16 MIL/uL (ref 4.22–5.81)
RDW: 17.1 % — ABNORMAL HIGH (ref 11.5–15.5)
WBC: 26.5 10*3/uL — ABNORMAL HIGH (ref 4.0–10.5)
nRBC: 0 % (ref 0.0–0.2)

## 2022-02-21 LAB — RESP PANEL BY RT-PCR (FLU A&B, COVID) ARPGX2
Influenza A by PCR: NEGATIVE
Influenza B by PCR: NEGATIVE
SARS Coronavirus 2 by RT PCR: NEGATIVE

## 2022-02-21 LAB — RAPID URINE DRUG SCREEN, HOSP PERFORMED
Amphetamines: NOT DETECTED
Barbiturates: NOT DETECTED
Benzodiazepines: NOT DETECTED
Cocaine: POSITIVE — AB
Opiates: NOT DETECTED
Tetrahydrocannabinol: POSITIVE — AB

## 2022-02-21 LAB — ETHANOL: Alcohol, Ethyl (B): <10 mg/dL (ref <10)

## 2022-02-21 MED ORDER — VITAMIN B-1 100 MG PO TABS
100.0000 mg | ORAL_TABLET | Freq: Every day | ORAL | Status: DC
  Administered 2022-02-22 – 2022-02-28 (×7): 100 mg via ORAL
  Filled 2022-02-21 (×8): qty 1

## 2022-02-21 MED ORDER — LOPERAMIDE HCL 2 MG PO CAPS: 2.0000 mg | ORAL_CAPSULE | ORAL | Status: AC | PRN

## 2022-02-21 MED ORDER — GABAPENTIN 300 MG PO CAPS: 600.0000 mg | ORAL_CAPSULE | Freq: Every day | ORAL | Status: DC

## 2022-02-21 MED ORDER — ONDANSETRON 4 MG PO TBDP
4.0000 mg | ORAL_TABLET | Freq: Four times a day (QID) | ORAL | Status: AC | PRN
  Filled 2022-02-21: qty 1

## 2022-02-21 MED ORDER — AMLODIPINE BESYLATE 5 MG PO TABS
10.0000 mg | ORAL_TABLET | Freq: Every day | ORAL | Status: DC
  Administered 2022-02-21: 10 mg via ORAL
  Filled 2022-02-21: qty 2

## 2022-02-21 MED ORDER — METFORMIN HCL 850 MG PO TABS
850.0000 mg | ORAL_TABLET | Freq: Two times a day (BID) | ORAL | Status: DC
  Administered 2022-02-22 – 2022-02-28 (×13): 850 mg via ORAL
  Filled 2022-02-21 (×15): qty 1

## 2022-02-21 MED ORDER — ATORVASTATIN CALCIUM 10 MG PO TABS
20.0000 mg | ORAL_TABLET | Freq: Every day | ORAL | Status: DC
  Administered 2022-02-21: 20 mg via ORAL
  Filled 2022-02-21: qty 2

## 2022-02-21 MED ORDER — SERTRALINE HCL 50 MG PO TABS
50.0000 mg | ORAL_TABLET | Freq: Every day | ORAL | Status: DC
  Administered 2022-02-22: 50 mg via ORAL
  Filled 2022-02-21 (×3): qty 1

## 2022-02-21 MED ORDER — ATORVASTATIN CALCIUM 20 MG PO TABS
20.0000 mg | ORAL_TABLET | Freq: Every day | ORAL | Status: DC
  Administered 2022-02-22 – 2022-02-28 (×7): 20 mg via ORAL
  Filled 2022-02-21 (×8): qty 1

## 2022-02-21 MED ORDER — HYDROXYZINE HCL 25 MG PO TABS
25.0000 mg | ORAL_TABLET | Freq: Three times a day (TID) | ORAL | Status: DC | PRN
  Administered 2022-02-21 – 2022-02-23 (×3): 25 mg via ORAL
  Filled 2022-02-21 (×3): qty 1

## 2022-02-21 MED ORDER — LORAZEPAM 1 MG PO TABS: 1.0000 mg | ORAL_TABLET | Freq: Four times a day (QID) | ORAL | Status: AC | PRN

## 2022-02-21 MED ORDER — ALUM & MAG HYDROXIDE-SIMETH 200-200-20 MG/5ML PO SUSP
30.0000 mL | ORAL | Status: DC | PRN
  Administered 2022-02-23: 30 mL via ORAL
  Filled 2022-02-21: qty 30

## 2022-02-21 MED ORDER — ADULT MULTIVITAMIN W/MINERALS CH
1.0000 | ORAL_TABLET | Freq: Every day | ORAL | Status: DC
  Administered 2022-02-22 – 2022-02-28 (×7): 1 via ORAL
  Filled 2022-02-21 (×8): qty 1

## 2022-02-21 MED ORDER — TRAZODONE HCL 50 MG PO TABS
50.0000 mg | ORAL_TABLET | Freq: Every evening | ORAL | Status: DC | PRN
  Administered 2022-02-21 – 2022-02-23 (×3): 50 mg via ORAL
  Filled 2022-02-21 (×3): qty 1

## 2022-02-21 MED ORDER — LORAZEPAM 2 MG/ML IJ SOLN
2.0000 mg | Freq: Once | INTRAMUSCULAR | Status: AC
  Administered 2022-02-21: 2 mg via INTRAMUSCULAR
  Filled 2022-02-21: qty 1

## 2022-02-21 MED ORDER — SERTRALINE HCL 50 MG PO TABS
50.0000 mg | ORAL_TABLET | Freq: Every day | ORAL | Status: DC
  Administered 2022-02-21: 50 mg via ORAL
  Filled 2022-02-21: qty 1

## 2022-02-21 MED ORDER — GABAPENTIN 300 MG PO CAPS
600.0000 mg | ORAL_CAPSULE | Freq: Every day | ORAL | Status: DC
  Administered 2022-02-21: 600 mg via ORAL
  Filled 2022-02-21 (×4): qty 2

## 2022-02-21 MED ORDER — MAGNESIUM HYDROXIDE 400 MG/5ML PO SUSP: 30.0000 mL | Freq: Every day | ORAL | Status: DC | PRN

## 2022-02-21 MED ORDER — AMLODIPINE BESYLATE 10 MG PO TABS
10.0000 mg | ORAL_TABLET | Freq: Every day | ORAL | Status: DC
  Administered 2022-02-22 – 2022-02-28 (×7): 10 mg via ORAL
  Filled 2022-02-21 (×8): qty 1

## 2022-02-21 MED ORDER — METFORMIN HCL 850 MG PO TABS
850.0000 mg | ORAL_TABLET | Freq: Two times a day (BID) | ORAL | Status: DC
  Administered 2022-02-21: 850 mg via ORAL
  Filled 2022-02-21 (×3): qty 1

## 2022-02-21 MED ORDER — ACETAMINOPHEN 325 MG PO TABS
650.0000 mg | ORAL_TABLET | Freq: Four times a day (QID) | ORAL | Status: DC | PRN
  Administered 2022-02-22 – 2022-02-25 (×3): 650 mg via ORAL
  Filled 2022-02-21 (×3): qty 2

## 2022-02-21 NOTE — ED Triage Notes (Signed)
[facility], was found walking from door to door asking for people to help him. Thinks people are out to kill him. He states that he wants to die. Has been doing crack cocaine all day.

## 2022-02-21 NOTE — ED Notes (Signed)
Transportation called for patient.

## 2022-02-21 NOTE — ED Notes (Addendum)
Patient changed out into burgandy scrubs. Patient has a black book bag and 1 patient belongings bag located across from room 18 at the nurse station.
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
## 2022-02-21 NOTE — ED Notes (Signed)
Pt's dinner has arrived, pt awaken from nap and sitting up to eat his dinner. Caffeine free coca-cola drink given to pt as per requested by pt.

## 2022-02-21 NOTE — ED Provider Notes (Signed)
Patient care was taken over by Dr. Leonette Monarch.  Patient reports he been doing cocaine all night and day recently.  He also reports EtOH use.  Labs reviewed.  His alcohol level is negative.  His drug screen was positive for cocaine and marijuana.  His other labs are nonconcerning.  He is continuing to express suicidal ideations.  He wants to leave and go to behavioral health.  Explained to him that we can have behavioral health evaluate him here.  Given his attempts to leave, IVC papers were initiated.  Patient is medically cleared.  TTS evaluation ordered.  Patient was given Ativan as patient reports that he needs something for his nerves and to help him sleep.   Malvin Johns, MD 02/21/22 1026

## 2022-02-21 NOTE — ED Notes (Signed)
Patient wanded by security. 

## 2022-02-21 NOTE — Progress Notes (Signed)
Pt was accepted to [facility] [place] 02/21/22; Bed Assignment 303-1  Pt meets inpatient criteria per Lindon Romp, NP  Attending Physician will be Dr. Caswell Corwin   Report can be called to: Adult unit: [phone]  Pt can arrive after 3:00pm  Care Team notified: [facility], RN, Burnis Medin, RN, Lindon Romp, NP, Ruben Im, RN  Nadara Mode, [place] 02/21/2022 @ 2:22 PM

## 2022-02-21 NOTE — ED Notes (Signed)
Pt's lunch has arrived, writer woke pt up to eat their lunch. Pt sitting up and eating lunch now. Caffeine free coca-cola provided to pt per request

## 2022-02-21 NOTE — ED Notes (Signed)
Called again to confirm patients status on transportation. They said [employer] will send a car as soon as they are able.
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
## 2022-02-22 ENCOUNTER — Encounter (HOSPITAL_COMMUNITY): Payer: Self-pay | Admitting: Nurse Practitioner

## 2022-02-22 ENCOUNTER — Encounter (HOSPITAL_COMMUNITY): Payer: Self-pay

## 2022-02-22 DIAGNOSIS — F1090 Alcohol use, unspecified, uncomplicated: Secondary | ICD-10-CM | POA: Insufficient documentation

## 2022-02-22 DIAGNOSIS — F1410 Cocaine abuse, uncomplicated: Secondary | ICD-10-CM | POA: Insufficient documentation

## 2022-02-22 DIAGNOSIS — F332 Major depressive disorder, recurrent severe without psychotic features: Secondary | ICD-10-CM | POA: Diagnosis not present

## 2022-02-22 LAB — TSH: TSH: 1.931 u[IU]/mL (ref 0.350–4.500)

## 2022-02-22 LAB — GLUCOSE, CAPILLARY: Glucose-Capillary: 130 mg/dL — ABNORMAL HIGH (ref 70–99)

## 2022-02-22 LAB — LIPID PANEL
Cholesterol: 136 mg/dL (ref 0–200)
HDL: 41 mg/dL (ref >40)
LDL Cholesterol: 76 mg/dL (ref 0–99)
Total CHOL/HDL Ratio: 3.3 RATIO
Triglycerides: 96 mg/dL (ref <150)
VLDL: 19 mg/dL (ref 0–40)

## 2022-02-22 MED ORDER — LORAZEPAM 1 MG PO TABS
1.0000 mg | ORAL_TABLET | Freq: Three times a day (TID) | ORAL | Status: AC
  Administered 2022-02-23 – 2022-02-24 (×3): 1 mg via ORAL
  Filled 2022-02-22 (×3): qty 1

## 2022-02-22 MED ORDER — LORAZEPAM 1 MG PO TABS
1.0000 mg | ORAL_TABLET | Freq: Every day | ORAL | Status: AC
  Administered 2022-02-26: 1 mg via ORAL
  Filled 2022-02-22: qty 1

## 2022-02-22 MED ORDER — TRIAMCINOLONE ACETONIDE 0.1 % EX OINT
TOPICAL_OINTMENT | Freq: Two times a day (BID) | CUTANEOUS | Status: DC
  Administered 2022-02-23: 1 via TOPICAL
  Filled 2022-02-22: qty 15

## 2022-02-22 MED ORDER — GABAPENTIN 300 MG PO CAPS
600.0000 mg | ORAL_CAPSULE | Freq: Once | ORAL | Status: AC
  Administered 2022-02-22: 600 mg via ORAL
  Filled 2022-02-22: qty 2

## 2022-02-22 MED ORDER — LORAZEPAM 1 MG PO TABS
1.0000 mg | ORAL_TABLET | Freq: Four times a day (QID) | ORAL | Status: AC
  Administered 2022-02-22 – 2022-02-23 (×4): 1 mg via ORAL
  Filled 2022-02-22 (×4): qty 1

## 2022-02-22 MED ORDER — THIAMINE HCL 100 MG/ML IJ SOLN
100.0000 mg | Freq: Once | INTRAMUSCULAR | Status: AC
  Administered 2022-02-22: 100 mg via INTRAMUSCULAR
  Filled 2022-02-22: qty 2

## 2022-02-22 MED ORDER — BACITRACIN-NEOMYCIN-POLYMYXIN OINTMENT TUBE
TOPICAL_OINTMENT | Freq: Two times a day (BID) | CUTANEOUS | Status: DC
  Administered 2022-02-23: 1 via TOPICAL
  Filled 2022-02-22 (×2): qty 14.17

## 2022-02-22 MED ORDER — LORAZEPAM 1 MG PO TABS
1.0000 mg | ORAL_TABLET | Freq: Two times a day (BID) | ORAL | Status: AC
  Administered 2022-02-24 – 2022-02-25 (×2): 1 mg via ORAL
  Filled 2022-02-22 (×2): qty 1

## 2022-02-22 MED ORDER — SERTRALINE HCL 100 MG PO TABS
100.0000 mg | ORAL_TABLET | Freq: Every day | ORAL | Status: DC
  Administered 2022-02-23 – 2022-02-28 (×6): 100 mg via ORAL
  Filled 2022-02-22 (×8): qty 1

## 2022-02-22 MED ORDER — GABAPENTIN 300 MG PO CAPS
300.0000 mg | ORAL_CAPSULE | Freq: Three times a day (TID) | ORAL | Status: DC
  Administered 2022-02-23 – 2022-02-28 (×16): 300 mg via ORAL
  Filled 2022-02-22 (×21): qty 1
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
## 2022-02-22 NOTE — Group Note (Signed)
[facility] LCSW Group Therapy Note    Group Date: 02/22/2022 Start Time: 1300 End Time: 1400  Type of Therapy and Topic:  Group Therapy:  Overcoming Obstacles  Participation Level:  [facility] PARTICIPATION LEVEL: Minimal  Mood: depressed    Description of Group:   In this group patients will be encouraged to explore what they see as obstacles to their own wellness and recovery. They will be guided to discuss their thoughts, feelings, and behaviors related to these obstacles. The group will process together ways to cope with barriers, with attention given to specific choices patients can make. Each patient will be challenged to identify changes they are motivated to make in order to overcome their obstacles. This group will be process-oriented, with patients participating in exploration of their own experiences as well as giving and receiving support and challenge from other group members.  Therapeutic Goals: 1. Patient will identify personal and current obstacles as they relate to admission. 2. Patient will identify barriers that currently interfere with their wellness or overcoming obstacles.  3. Patient will identify feelings, thought process and behaviors related to these barriers. 4. Patient will identify two changes they are willing to make to overcome these obstacles:    Summary of Patient Progress  Patient was present for the entirety of group session. Patient participated in opening and closing remarks. However, patient did not contribute at all to the topic of discussion despite encouraged participation.    Therapeutic Modalities:   Cognitive Behavioral Therapy Solution Focused Therapy Motivational Interviewing Relapse Prevention Therapy   [name], [place]
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
## 2022-02-22 NOTE — Group Note (Signed)
[facility] LCSW Group Therapy Note   Group Date: 02/22/2022 Start Time: 1300 End Time: 1400   Type of Therapy and Topic: Group Therapy: Avoiding Self-Sabotaging and Enabling Behaviors  Participation Level: Active  Mood:  Description of Group:  In this group, patients will learn how to identify obstacles, self-sabotaging and enabling behaviors, as well as: what are they, why do we do them and what needs these behaviors meet. Discuss unhealthy relationships and how to have positive healthy boundaries with those that sabotage and enable. Explore aspects of self-sabotage and enabling in yourself and how to limit these self-destructive behaviors in everyday life.   Therapeutic Goals: 1. Patient will identify one obstacle that relates to self-sabotage and enabling behaviors 2. Patient will identify one personal self-sabotaging or enabling behavior they did prior to admission 3. Patient will state a plan to change the above identified behavior 4. Patient will demonstrate ability to communicate their needs through discussion and/or role play.    Summary of Patient Progress:  Therapeutic Modalities:  Cognitive Behavioral Therapy Person-Centered Therapy Motivational Interviewing    Laurelai Lepp S Tambria Pfannenstiel, LCSW 

## 2022-02-22 NOTE — [facility] Suicide Risk Assessment (Cosign Needed Addendum)
Suicide Risk Assessment  Admission Assessment    [facility] Admission Suicide Risk Assessment   Nursing information obtained from:    Demographic factors:  Unemployed, Low socioeconomic status, Male Current Mental Status:  Suicidal ideation indicated by patient Loss Factors:  Financial problems / change in socioeconomic status, Decrease in vocational status, Loss of significant relationship Historical Factors:  NA Risk Reduction Factors:  NA  Total Time spent with patient: 1.5 hours Principal Problem: MDD (major depressive disorder), recurrent severe, without psychosis ([place]) Diagnosis:  Principal Problem:   MDD (major depressive disorder), recurrent severe, without psychosis ([place]) Active Problems:   Type 2 diabetes mellitus ([place])   Eczema   Tobacco use disorder   Generalized anxiety disorder   Alcohol use disorder   Cocaine use disorder ([place])  History of Present Illness: Jeffery Wheeler is a 56 yo [place] American male with a prior mental health history of polysubstance abuse (ETOH, cocaine), and history of incarceration x 30 yrs (total) for bank robbery & other charges who was transported to the [employer]  by [facility] with complaints of worsening depressive symptoms and +SI with a plan to overdose on drugs in the context of substance abuse and financial difficulties. Pt was involuntarily transferred to this [facility] for treatment and stabilization of his mood.   Continued Clinical Symptoms:  pt continues to present with suicidal ideations with a plan to overdose. He is able to verbally contract for safety on the unit. He reports worsening feelings of hopelessness, worthlessness, helplessness, insomnia, racing thoughts, anhedonia, trouble with concentration, decreased energy levels, anxiety, panic attacks, as well as recurrent thoughts of death since release from prison 1 year ago.  Patient reports that he has been trying to self-medicate with drugs and alcohol but his depressive symptoms have  gotten worse with these substances. Current symptoms require continuous hospitalization for treatment and stabilization.  Alcohol Use Disorder Identification Test Final Score (AUDIT): 34 The "Alcohol Use Disorders Identification Test", Guidelines for Use in Primary Care, Second Edition.  World Pharmacologist [facility]). Score between 0-7:  no or low risk or alcohol related problems. Score between 8-15:  moderate risk of alcohol related problems. Score between 16-19:  high risk of alcohol related problems. Score 20 or above:  warrants further diagnostic evaluation for alcohol dependence and treatment.  CLINICAL FACTORS:   Depression:   Anhedonia Hopelessness Insomnia Recent sense of peace/wellbeing Severe  Musculoskeletal: Strength & Muscle Tone: within normal limits Gait & Station: normal Patient leans: N/A  Psychiatric Specialty Exam:  Presentation  General Appearance:  Disheveled  Eye Contact: Fair  Speech: Clear and Coherent  Speech Volume: Normal  Handedness: Right   Mood and Affect  Mood: Depressed; Anxious  Affect: Congruent   Thought Process  Thought Processes: Coherent  Descriptions of Associations:Intact  Orientation:Full (Time, Place and Person)  Thought Content:Logical  History of Schizophrenia/Schizoaffective disorder:No  Duration of Psychotic Symptoms:No data recorded Hallucinations:Hallucinations: None Description of Visual Hallucinations: saw people after him last night  Ideas of Reference:None  Suicidal Thoughts:Suicidal Thoughts: Yes, Active SI Active Intent and/or Plan: Without Intent; With Plan  Homicidal Thoughts:Homicidal Thoughts: No   Sensorium  Memory: Immediate Good  Judgment: Fair  Insight: Fair   Community education officer  Concentration: Fair  Attention Span: Fair  Recall: Roel Cluck of Knowledge: Fair  Language: Fair   Psychomotor Activity  Psychomotor Activity: Psychomotor Activity:  Normal   Assets  Assets: Communication Skills; Resilience   Sleep  Sleep: Sleep: Poor  Physical Exam: Physical Exam Constitutional:      Appearance: Normal appearance.  Pulmonary:     Effort: Pulmonary effort is normal.  Musculoskeletal:        General: Normal range of motion.  Neurological:     Mental Status: He is alert and oriented to person, place, and time.    Review of Systems  Constitutional: Negative.   HENT: Negative.    Respiratory: Negative.    Cardiovascular: Negative.   Gastrointestinal:  Negative for heartburn.  Genitourinary: Negative.   Musculoskeletal: Negative.   Skin:  Positive for itching.  Neurological: Negative.   Psychiatric/Behavioral:  Positive for depression, substance abuse and suicidal ideas. Negative for hallucinations and memory loss. The patient is nervous/anxious and has insomnia.    Blood pressure 135/81, pulse 82, temperature 98 F (36.7 C), temperature source Oral, resp. rate 16, height 5\' 8"  (1.727 m), weight 83.9 kg, SpO2 98 %. Body mass index is 28.12 kg/m.   COGNITIVE FEATURES THAT CONTRIBUTE TO RISK:  None    SUICIDE RISK:   Moderate:  Frequent suicidal ideation with limited intensity, and duration, some specificity in terms of plans, no associated intent, good self-control, limited dysphoria/symptomatology, some risk factors present, and identifiable protective factors, including available and accessible social support.  PLAN Safety and Monitoring: Involuntary admission to inpatient psychiatric unit for safety, stabilization and treatment Daily contact with patient to assess and evaluate symptoms and progress in treatment Patient's case to be discussed in multi-disciplinary team meeting Observation Level : q15 minute checks Vital signs: q12 hours Precautions:Safety   Long Term Goal(s): Improvement in symptoms so as ready for discharge   Short Term Goals: Ability to identify changes in lifestyle to reduce recurrence  of condition will improve, Ability to verbalize feelings will improve, Ability to disclose and discuss suicidal ideas, Ability to identify and develop effective coping behaviors will improve, Ability to maintain clinical measurements within normal limits will improve, Compliance with prescribed medications will improve, and Ability to identify triggers associated with substance abuse/mental health issues will improve   Diagnoses:  Principal Problem:   MDD (major depressive disorder), recurrent episode, severe ([place]) Active Problems:   Type 2 diabetes mellitus ([place])   Eczema   Tobacco use disorder   Generalized anxiety disorder   Alcohol use disorder   Cocaine use disorder ([place])   Severe recurrent major depressive disorder without psychotic features ([place]) -Continue Zoloft and increase to 100 mg starting 10/21   Anxiety -Continue Hydroxyzine 25 mg every 6 hours PRN -Change Gabapentin to 300 mg TID starting 10/21 at 0800   ETOH use disorder Monitor for signs of withdrawal -Start Ativan Detox protocol for alcohol use-See MAR  - Oral thiamine and MVI replacement - Abstinence from substances encouraged  - SW to look into options for outpatient SA treatment at discharge  -Continue Ativan 1mg  tabs every 6 hours PRNCIWA > 10   Other medications for medical reasons -Continue Norvasc 10 mg for htn -Continue Metformin 850 mg BID for DM   Other PRNS -Continue Tylenol 650 mg every 6 hours PRN for mild pain -Continue Maalox 30 mg every 4 hrs PRN for indigestion -Continue Imodium 2-4 mg as needed for diarrhea -Continue Milk of Magnesia as needed every 6 hrs for constipation -Continue Zofran disintegrating tabs every 6 hrs PRN for nausea    Discharge Planning: Social work and case management to assist with discharge planning and identification of hospital follow-up needs prior to discharge Estimated LOS: 5-7 days Discharge Concerns: Need  to establish a safety plan; Medication compliance and  effectiveness Discharge Goals: Return home with outpatient referrals for mental health follow-up including medication management/psychotherapy   I certify that inpatient services furnished can reasonably be expected to improve the patient's condition.    I certify that inpatient services furnished can reasonably be expected to improve the patient's condition.   Nicholes Rough, NP 02/22/2022, 5:42 PM

## 2022-02-22 NOTE — [facility] Counselor (Signed)
Adult Comprehensive Assessment  Patient ID: Jeffery Wheeler, male   DOB: 1966-02-19, 56 y.o.   MRN: [id number]  Information Source: Information source: Patient  Current Stressors:  Patient states their primary concerns and needs for treatment are:: During assessment, patients states he got ahold of some bad drugs "been dealing with depression and thought about ending it." States drugs caused him to have visual hallucinations such as "seeing someone in the woods and seeing disco lights on the ambulance cieling." States depressive symptoms have been present since leaving prison. Patient states their goals for this hospitilization and ongoing recovery are:: States his goal for hospitalization is to "get into some kind of program outside of [address] and [employer] / Learning stressors: none reported Employment / Job issues: patient is unemployed Family Relationships: states he is caring for his great aunt and niece/nephew wich he is not accustom to taking responsibility for others Surveyor, quantity / Lack of resources (include bankruptcy): patient has no income Housing / Lack of housing: reports housing instability Social relationships: states his relationships are strained Substance abuse: see SUD section fo rfull details Bereavement / Loss: none reported  Living/Environment/Situation:  Living Arrangements: Alone Living conditions (as described by patient or guardian): patient is homeless Who else lives in the home?: n/a How long has patient lived in current situation?: most recently 2 days ago What is atmosphere in current home: Chaotic, Dangerous  Family History:  Marital status: Single Are you sexually active?: Yes What is your sexual orientation?: Heterosexual Has your sexual activity been affected by drugs, alcohol, medication, or emotional stress?: none reported Does patient have children?: No  Childhood History:  By whom was/is the patient raised?: Other  (Comment) Additional childhood history information: reports being raised by his maternal aunt Description of patient's relationship with caregiver when they were a child: States "I guess we got along alright" Patient's description of current relationship with people who raised him/her: Currenltly, patient states his relationship with his aunt is "beyond repair." How were you disciplined when you got in trouble as a child/adolescent?: states he was physically reprimanded WNL Does patient have siblings?: Yes Number of Siblings: 1 Description of patient's current relationship with siblings: states his brother is incarcerated Did patient suffer any verbal/emotional/physical/sexual abuse as a child?: No Did patient suffer from severe childhood neglect?: No Has patient ever been sexually abused/assaulted/raped as an adolescent or adult?: No Was the patient ever a victim of a crime or a disaster?: No Spoken with a professional about abuse?: No Does patient feel these issues are resolved?: No Witnessed domestic violence?: No Has patient been affected by domestic violence as an adult?: No  Education:  Highest grade of school patient has completed: 9th grade Currently a student?: No Learning disability?: Yes What learning problems does patient have?: dx of ADHD  Employment/Work Situation:   Employment Situation: Unemployed Patient's Job has Been Impacted by Current Illness: Yes Describe how Patient's Job has Been Impacted: unable to maintain employment when he is abusing drugs Has Patient ever Been in the [employer]?: No  Financial Resources:   Financial resources: No income, Media planner Does patient have a Lawyer or guardian?: No  Alcohol/Substance Abuse:   If attempted suicide, did drugs/alcohol play a role in this?: (P)  (n/a) Alcohol/Substance Abuse Treatment Hx: (P) Past Tx, Inpatient, Past Tx, Outpatient, Attends AA/NA, Past detox Has alcohol/substance abuse ever  caused legal problems?: (P) Yes (multiple charges for possession, possession w/ intent to sell)  Social Support System:  Patient's Community Support System: (P) None Describe Community Support System: (P) states "I have nobody right now" Type of faith/religion: (P) none How does patient's faith help to cope with current illness?: (P) n/a  Leisure/Recreation:   Do You Have Hobbies?: (P) No  Strengths/Needs:   Patient states these barriers may affect/interfere with their treatment: (P) none reported Patient states these barriers may affect their return to the community: (P) patient is homeless Other important information patient would like considered in planning for their treatment: (P) none reported  Discharge Plan:   Currently receiving community mental health services: (P) No Does patient have access to transportation?: (P) No Does patient have financial barriers related to discharge medications?: (P) No ([employer] private ins) Plan for living situation after discharge: (P) TBD Will patient be returning to same living situation after discharge?: (P) No  Summary/Recommendations:   Summary and Recommendations (to be completed by the evaluator): 56 y/o male w/ dx of MDD recurrent severe homeless from [place] w/ [place] private insurance admitted due to suicidal ideation and substance induced visual hallucinations; duration of symptoms unclear.  During assessment, patients states he got ahold of some bad drugs "been dealing with depression and thought about ending it." States drugs caused him to have visual hallucinations such as "seeing someone in the woods and seeing disco lights on the ambulance cieling." States depressive symptoms have been present since leaving prison. States his goal for hospitalization is to "get into some kind of program outside of [place] and [employer]."  Patient presents as irritable albeit cooperative. Affect is depressed, congruent with mood and context.  Appearance is WNL. Speech volume, speed, and content is WNL. No evidence of memory or concentration impairment. Patient oriented to person, place, time, and situation. Currently denies HI, AVH. Threatens suicide conditionally whether he is able to get into acceptable substance use program. No evidence of current psychotic features present.    Patient is not currently associated with any outpatient mental health services; has signed consent for CSW to make referral for residential SUD treatment. Therapeutic recommendations include further crisis stabilization, medication management, group therapy, and case management.   [name]. 02/22/2022

## 2022-02-22 NOTE — [facility] Suicide Risk Assessment (Signed)
[place] INPATIENT:  Family/Significant Other Suicide Prevention Education  Suicide Prevention Education:  Education Completed; Jeffery Wheeler, Sister, [phone] has been identified by the patient as the family member/significant other with whom the patient will be residing, and identified as the person(s) who will aid the patient in the event of a mental health crisis (suicidal ideations/suicide attempt).  With written consent from the patient, the family member/significant other has been provided the following suicide prevention education, prior to the and/or following the discharge of the patient.  Sister states her ability to assist the patient is limited due to her living in [place]. 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  The suicide prevention education provided includes the following: Suicide risk factors Suicide prevention and interventions National Suicide Hotline telephone number [facility] assessment telephone number [facility] Emergency Assistance [place] and/or Residential Mobile Crisis Unit telephone number  Request made of family/significant other to: Remove weapons (e.g., guns, rifles, knives), all items previously/currently identified as safety concern.   Remove drugs/medications (over-the-counter, prescriptions, illicit drugs), all items previously/currently identified as a safety concern.  The family member/significant other verbalizes understanding of the suicide prevention education information provided.    Jeffery Wheeler 02/22/2022, 3:05 PM

## 2022-02-22 NOTE — Progress Notes (Signed)
   02/22/22 2015  Psych Admission Type (Psych Patients Only)  Admission Status Involuntary  Psychosocial Assessment  Patient Complaints Depression  Eye Contact Fair  Facial Expression Flat  Affect Sad;Flat  Speech Logical/coherent  Interaction Cautious;Forwards little  Motor Activity Slow  Appearance/Hygiene Unremarkable  Behavior Characteristics Cooperative  Mood Anxious;Depressed  Aggressive Behavior  Effect No apparent injury  Thought Process  Coherency WDL  Content Blaming self  Delusions None reported or observed  Perception WDL  Hallucination None reported or observed  Judgment Impaired  Confusion WDL  Danger to Self  Current suicidal ideation? Denies  Danger to Others  Danger to Others None reported or observed

## 2022-02-22 NOTE — Plan of Care (Signed)
Nurse discussed anxiety, depression and coping skills with patient.  

## 2022-02-22 NOTE — Group Note (Signed)
Date:  02/22/2022 Time:  2:38 PM  Group Topic/Focus:  Goals Group:   The focus of this group is to help patients establish daily goals to achieve during treatment and discuss how the patient can incorporate goal setting into their daily lives to aide in recovery.    Participation Level:  Did Not Attend  Participation Quality:      Affect:      Cognitive:      Insight: None  Engagement in Group:      Modes of Intervention:      Additional Comments:     Jerrye Beavers 02/22/2022, 2:38 PM

## 2022-02-23 DIAGNOSIS — F332 Major depressive disorder, recurrent severe without psychotic features: Secondary | ICD-10-CM | POA: Diagnosis not present

## 2022-02-23 LAB — URINALYSIS, ROUTINE W REFLEX MICROSCOPIC
Bilirubin Urine: NEGATIVE
Glucose, UA: 250 mg/dL — AB
Hgb urine dipstick: NEGATIVE
Ketones, ur: NEGATIVE mg/dL
Nitrite: NEGATIVE
Protein, ur: NEGATIVE mg/dL
Specific Gravity, Urine: 1.01 (ref 1.005–1.030)
pH: 6 (ref 5.0–8.0)

## 2022-02-23 LAB — HEMOGLOBIN A1C
Hgb A1c MFr Bld: 7.4 % — ABNORMAL HIGH (ref 4.8–5.6)
Mean Plasma Glucose: 165.68 mg/dL

## 2022-02-23 LAB — VITAMIN B12: Vitamin B-12: 471 pg/mL (ref 180–914)

## 2022-02-23 LAB — LIPID PANEL
Cholesterol: 140 mg/dL (ref 0–200)
HDL: 42 mg/dL (ref >40)
LDL Cholesterol: 85 mg/dL (ref 0–99)
Total CHOL/HDL Ratio: 3.3 RATIO
Triglycerides: 64 mg/dL (ref <150)
VLDL: 13 mg/dL (ref 0–40)

## 2022-02-23 LAB — GLUCOSE, CAPILLARY: Glucose-Capillary: 122 mg/dL — ABNORMAL HIGH (ref 70–99)

## 2022-02-23 LAB — VITAMIN D 25 HYDROXY (VIT D DEFICIENCY, FRACTURES): Vit D, 25-Hydroxy: 29 ng/mL — ABNORMAL LOW (ref 30–100)

## 2022-02-23 LAB — URINALYSIS, MICROSCOPIC (REFLEX)
Bacteria, UA: NONE SEEN
RBC / HPF: NONE SEEN RBC/hpf (ref 0–5)
Squamous Epithelial / HPF: NONE SEEN (ref 0–5)

## 2022-02-23 MED ORDER — VITAMIN D (ERGOCALCIFEROL) 1.25 MG (50000 UNIT) PO CAPS
50000.0000 [IU] | ORAL_CAPSULE | ORAL | Status: DC
  Administered 2022-02-23: 50000 [IU] via ORAL
  Filled 2022-02-23 (×2): qty 1

## 2022-02-23 NOTE — Progress Notes (Signed)
   02/23/22 0530  Sleep  Number of Hours 8

## 2022-02-23 NOTE — Progress Notes (Addendum)
Pt is A&OX4, calm, guarded, isolative, denies suicidal ideations, denies homicidal ideations, denies auditory hallucinations and denies visual hallucinations. Pt verbally agrees to approach staff if these become apparent and before harming self or others. Pt denies experiencing nightmares. Mood and affect are congruent. Pt appetite is ok. No complaints of anxiety, distress, pain and/or discomfort at this time. Pt's memory appears to be grossly intact, and Pt hasn't displayed any injurious behaviors. Pt is medication compliant. There's no evidence of suicidal intent. Psychomotor activity was WNL. No s/s of Parkinson, Dystonia, Akathisia and/or Tardive Dyskinesia noted.

## 2022-02-23 NOTE — B[facility] Counselor (Signed)
Clinical Social Work Note  At doctor request and with written consent, CSW submitted a referral to [facility] for rehab services.  Selmer Dominion, LCSW 02/23/2022, 2:29 PM

## 2022-02-23 NOTE — Progress Notes (Signed)
Adult Psychoeducational Group Note  Date:  02/23/2022 Time:  8:52 PM  Group Topic/Focus:  Wrap-Up Group:   The focus of this group is to help patients review their daily goal of treatment and discuss progress on daily workbooks.  Participation Level:  Active  Participation Quality:  Appropriate  Affect:  Appropriate  Cognitive:  Appropriate  Insight: Appropriate  Engagement in Group:  Engaged  Modes of Intervention:  Education and Exploration  Additional Comments:  Patient attended and participated in group tonight. He reports that nothing significant happened to him today, however, the thing he like about himself is that he is respectful.  Salley Scarlet [facility] 02/23/2022, 8:52 PM

## 2022-02-23 NOTE — Progress Notes (Addendum)
[facility]  02/23/2022 1:21 PM Jeffery Wheeler  MRN:  [id number] Principal Problem: MDD (major depressive disorder), recurrent severe, without psychosis (HCC) Diagnosis: Principal Problem:   MDD (major depressive disorder), recurrent severe, without psychosis (HCC) Active Problems:   Type 2 diabetes mellitus (HCC)   Eczema   Tobacco use disorder   Generalized anxiety disorder   Alcohol use disorder   Cocaine use disorder (HCC)  HPI: Jeffery Wheeler. Jeffery Wheeler is a 56 yo [place] male with a prior mental health history of polysubstance abuse (ETOH, cocaine), and history of incarceration x 30 yrs (total) for bank robbery & other charges who was transported to the [facility] with complaints of worsening depressive symptoms and +SI with a plan to overdose on drugs in the context of substance abuse and financial difficulties. [facility] and stabilization of his mood.   24 hr chart review: ` Patient's chart reviewed, his case discussed with his treatment team.  Patient's vital signs have been within normal limits for the past 24 hours.  Patient is taking all medications as scheduled, and required the following as needed medications in the past 24 hours: Tylenol 650 mg yesterday afternoon, hydroxyzine 25 mg yesterday evening, and trazodone 50 mg at night for sleep..  As per nursing documentation, patient slept for 8 hours last night.  Patient has not required any agitation protocol medications in the past 24 hours but has been isolative to his room and guarded as per nursing report.  Today's patient assessment note: Several attempts to to assess patient's mental status and writer was successful on the third attempt.  On the first attempt, patient reported that he was in pain and stated writer should return.  On second attempt to assess patient he did not answer after several calls of his name.  On the third attempt, patient was in  the day room interacting with peers.  Patient continues to report suicidal ideations, and states that his plan is to overdose on drugs.  He however, is able to verbally contract for safety here on the unit, and states that he would not do anything to hurt himself while here.  He complains of having racing thoughts, but reports a good night sleep.  He also reports a good appetite.  Mood is depressed, and affect is congruent.  Eye contact remains fair, attention to personal hygiene and grooming is poor, and the need to tend to personal hygiene and grooming reiterated.  Speech is clear, thought contents are logical, and patient denies auditory or visual hallucinations.  He denies paranoia and there is no evidence of delusional thinking.  He reports generalized body pain, but states that current as needed medications are helpful.  Patient is able to verbalize that Ativan is helping with his withdrawal symptoms from alcohol use.  Patient asked for a psychologist to help him with figuring out why he does the things that he draws in his life.  Patient educated that this service is not available inpatient and he will have to find this when he is discharged.  Yesterday, the patient stated that he [facility].  Today, patient states that [facility] because he has some support system there.  Will make CSW aware.  Yesterday, Zoloft was increased to 100 mg with the first dose given earlier today morning.  Patient continues to be on the  Ativan detox protocol for alcohol abuse.  As per the last CIWA score he had some anxiety, leading to a CIWA score of 1.  We are continuing all medications as listed below.  Writer spoke to patient about the fact that the gabapentin might be causing daytime drowsiness and morning sleepiness.  Patient denied that this is the case, and stated that he is used to sleeping during the day, because during the day while in  prison he would sleep to take himself away from that institution.  He reports that he is trying to get used to a normal life again outside of the present walls.  We will continue to monitor.  Total Time spent with patient: 30 minutes  Past Psychiatric History: MDD, polysubstance abuse  Past Medical History:  Past Medical History:  Diagnosis Date   Eczema    HLD (hyperlipidemia)    HTN (hypertension)    Type 2 diabetes mellitus (HCC)     Past Surgical History:  Procedure Laterality Date   CERVICAL SPINE SURGERY  2018   right shoulder  1991   Family History:  Family History  Problem Relation Age of Onset   Diabetes type II Sister    Diabetes type II Paternal Grandmother    Diabetes type II Maternal Aunt    Family Psychiatric  History: non reported Social History:  Social History   Substance and Sexual Activity  Alcohol Use Yes   Alcohol/week: 200.0 standard drinks of alcohol   Types: 80 Cans of beer, 120 Shots of liquor per week   Comment: reports periodically drinking as much as 4x 40 oz malt liquer and 1x fifth of tequila daily as fund permit     Social History   Substance and Sexual Activity  Drug Use Yes   Types: "Crack" cocaine, Methamphetamines, Marijuana, Fentanyl   Comment: Consistantly using crack cocaine almost on a daily basis, cannabis occasionally, other drugs as presented to him by other drug users    Social History   Socioeconomic History   Marital status: Single    Spouse name: Not on file   Number of children: Not on file   Years of education: Not on file   Highest education level: Not on file  Occupational History   Not on file  Tobacco Use   Smoking status: Never   Smokeless tobacco: Never  Substance and Sexual Activity   Alcohol use: Yes    Alcohol/week: 200.0 standard drinks of alcohol    Types: 80 Cans of beer, 120 Shots of liquor per week    Comment: reports periodically drinking as much as 4x 40 oz malt liquer and 1x fifth of tequila  daily as fund permit   Drug use: Yes    Types: "Crack" cocaine, Methamphetamines, Marijuana, Fentanyl    Comment: Consistantly using crack cocaine almost on a daily basis, cannabis occasionally, other drugs as presented to him by other drug users   Sexual activity: Yes    Partners: Female  Other Topics Concern   Not on file  Social History Narrative   ** Merged History Encounter **       Social Determinants of Health   Financial Resource Strain: Not on file  Food Insecurity: Food Insecurity Present (02/21/2022)   Hunger Vital Sign    Worried About Running Out of Food in the Last Year: Often true    Ran Out of Food in the Last Year: Sometimes true  Transportation Needs: Unmet Transportation Needs (02/21/2022)   PRAPARE -  Hydrologist (Medical): Yes    Lack of Transportation (Non-Medical): Yes  Physical Activity: Not on file  Stress: Not on file  Social Connections: Not on file   Additional Social History:   Sleep: Good  Appetite:  Good  Current Medications: Current Facility-Administered Medications  Medication Dose Route Frequency Provider Last Rate Last Admin   acetaminophen (TYLENOL) tablet 650 mg  650 mg Oral Q6H PRN Rozetta Nunnery, NP   650 mg at 02/22/22 1656   alum & mag hydroxide-simeth (MAALOX/MYLANTA) 200-200-20 MG/5ML suspension 30 mL  30 mL Oral Q4H PRN Lindon Romp A, NP   30 mL at 02/23/22 0124   amLODipine (NORVASC) tablet 10 mg  10 mg Oral Daily Lindon Romp A, NP   10 mg at 02/23/22 3818   atorvastatin (LIPITOR) tablet 20 mg  20 mg Oral Daily Lindon Romp A, NP   20 mg at 02/23/22 0813   gabapentin (NEURONTIN) capsule 300 mg  300 mg Oral TID Nicholes Rough, NP   300 mg at 02/23/22 1300   hydrOXYzine (ATARAX) tablet 25 mg  25 mg Oral TID PRN Rozetta Nunnery, NP   25 mg at 02/22/22 1840   loperamide (IMODIUM) capsule 2-4 mg  2-4 mg Oral PRN Rozetta Nunnery, NP       LORazepam (ATIVAN) tablet 1 mg  1 mg Oral Q6H PRN Rozetta Nunnery, NP        LORazepam (ATIVAN) tablet 1 mg  1 mg Oral TID Nicholes Rough, NP   1 mg at 02/23/22 1249   Followed by   Derrill Memo ON 02/24/2022] LORazepam (ATIVAN) tablet 1 mg  1 mg Oral BID Nicholes Rough, NP       Followed by   Derrill Memo ON 02/26/2022] LORazepam (ATIVAN) tablet 1 mg  1 mg Oral Daily Viyaan Champine, NP       magnesium hydroxide (MILK OF MAGNESIA) suspension 30 mL  30 mL Oral Daily PRN Lindon Romp A, NP       metFORMIN (GLUCOPHAGE) tablet 850 mg  850 mg Oral BID WC Lindon Romp A, NP   850 mg at 02/23/22 0815   multivitamin with minerals tablet 1 tablet  1 tablet Oral Daily Lindon Romp A, NP   1 tablet at 02/23/22 0815   neomycin-bacitracin-polymyxin (NEOSPORIN) ointment   Topical BID Nicholes Rough, NP   1 Application at 29/93/71 0815   ondansetron (ZOFRAN-ODT) disintegrating tablet 4 mg  4 mg Oral Q6H PRN Rozetta Nunnery, NP       sertraline (ZOLOFT) tablet 100 mg  100 mg Oral Daily Bearett Porcaro, NP   100 mg at 02/23/22 0815   thiamine (Vitamin B-1) tablet 100 mg  100 mg Oral Daily Lindon Romp A, NP   100 mg at 02/23/22 0816   traZODone (DESYREL) tablet 50 mg  50 mg Oral QHS PRN Rozetta Nunnery, NP   50 mg at 02/22/22 2108   triamcinolone ointment (KENALOG) 0.1 %   Topical BID Nicholes Rough, NP   1 Application at 69/67/89 3810   Vitamin D (Ergocalciferol) (DRISDOL) 1.25 MG (50000 UNIT) capsule 50,000 Units  50,000 Units Oral Q7 days Nicholes Rough, NP        Lab Results:  Results for orders placed or performed during the hospital encounter of 02/21/22 (from the past 48 hour(s))  Glucose, capillary     Status: Abnormal   Collection Time: 02/22/22  6:20 AM  Result Value Ref Range  Glucose-Capillary 130 (H) 70 - 99 mg/dL    Comment: Glucose reference range applies only to samples taken after fasting for at least 8 hours.  Lipid panel     Status: None   Collection Time: 02/22/22  6:25 AM  Result Value Ref Range   Cholesterol 136 0 - 200 mg/dL   Triglycerides 96 <409 mg/dL   HDL 41 >81  mg/dL   Total CHOL/HDL Ratio 3.3 RATIO   VLDL 19 0 - 40 mg/dL   LDL Cholesterol 76 0 - 99 mg/dL    Comment:        Total Cholesterol/HDL:CHD Risk Coronary Heart Disease Risk Table                     Men   Women  1/2 Average Risk   3.4   3.3  Average Risk       5.0   4.4  2 X Average Risk   9.6   7.1  3 X Average Risk  23.4   11.0        Use the calculated Patient Ratio above and the CHD Risk Table to determine the patient's CHD Risk.        ATP III CLASSIFICATION (LDL):  <100     mg/dL   Optimal  191-478  mg/dL   Near or Above                    Optimal  130-159  mg/dL   Borderline  295-621  mg/dL   High  >308     mg/dL   Very High Performed at [facility], [address], [place], [place] [address]   TSH     Status: None   Collection Time: 02/22/22  6:25 AM  Result Value Ref Range   TSH 1.931 0.350 - 4.500 uIU/mL    Comment: Performed by a 3rd Generation assay with a functional sensitivity of <=0.01 uIU/mL. Performed at [facility], [address] [place], [place] [address]   Glucose, capillary     Status: Abnormal   Collection Time: 02/23/22  5:45 AM  Result Value Ref Range   Glucose-Capillary 122 (H) 70 - 99 mg/dL    Comment: Glucose reference range applies only to samples taken after fasting for at least 8 hours.  Lipid panel     Status: None   Collection Time: 02/23/22  6:43 AM  Result Value Ref Range   Cholesterol 140 0 - 200 mg/dL   Triglycerides 64 <528 mg/dL   HDL 42 >41 mg/dL   Total CHOL/HDL Ratio 3.3 RATIO   VLDL 13 0 - 40 mg/dL   LDL Cholesterol 85 0 - 99 mg/dL    Comment:        Total Cholesterol/HDL:CHD Risk Coronary Heart Disease Risk Table                     Men   Women  1/2 Average Risk   3.4   3.3  Average Risk       5.0   4.4  2 X Average Risk   9.6   7.1  3 X Average Risk  23.4   11.0        Use the calculated Patient Ratio above and the CHD Risk Table to determine the patient's CHD Risk.         ATP III CLASSIFICATION (LDL):  <100     mg/dL  Optimal  100-129  mg/dL   Near or Above                    Optimal  130-159  mg/dL   Borderline  412-878  mg/dL   High  >676     mg/dL   Very High Performed at [facility], [address] [place], [place] [address]   Hemoglobin A1c     Status: Abnormal   Collection Time: 02/23/22  6:43 AM  Result Value Ref Range   Hgb A1c MFr Bld 7.4 (H) 4.8 - 5.6 %    Comment: (NOTE) Pre diabetes:          5.7%-6.4%  Diabetes:              >6.4%  Glycemic control for   <7.0% adults with diabetes    Mean Plasma Glucose 165.68 mg/dL    Comment: Performed at  [facility], [address] [place], [place] [address]  Vitamin B12     Status: None   Collection Time: 02/23/22  6:43 AM  Result Value Ref Range   Vitamin B-12 471 180 - 914 pg/mL    Comment: (NOTE) This assay is not validated for testing neonatal or myeloproliferative syndrome specimens for Vitamin B12 levels. Performed at [facility], [address] [place], [place] [address]     Blood Alcohol level:  Lab Results  Component Value Date   ETH <10 02/21/2022   ETH <10 01/23/2022    Metabolic Disorder Labs: Lab Results  Component Value Date   HGBA1C 7.4 (H) 02/23/2022   MPG 165.68 02/23/2022   No results found for: "PROLACTIN" Lab Results  Component Value Date   CHOL 140 02/23/2022   TRIG 64 02/23/2022   HDL 42 02/23/2022   CHOLHDL 3.3 02/23/2022   VLDL 13 02/23/2022   LDLCALC 85 02/23/2022   LDLCALC 76 02/22/2022    Physical Findings: AIMS:  , ,  ,  ,    CIWA:  CIWA-Ar Total: 0 COWS:     Musculoskeletal: Strength & Muscle Tone: within normal limits Gait & Station: normal Patient leans: N/A  Psychiatric Specialty Exam:  Presentation  General Appearance:  Appropriate for Environment; Fairly Groomed  Eye Contact: Fair  Speech: Clear and Coherent  Speech Volume: Normal  Handedness: Right   Mood and  Affect  Mood: Depressed; Anxious  Affect: Congruent   Thought Process  Thought Processes: Coherent  Descriptions of Associations:Intact  Orientation:Full (Time, Place and Person)  Thought Content:Logical  History of Schizophrenia/Schizoaffective disorder:No  Duration of Psychotic Symptoms:No data recorded Hallucinations:Hallucinations: None  Ideas of Reference:None  Suicidal Thoughts:Suicidal Thoughts: Yes, Active SI Active Intent and/or Plan: With Plan; Without Intent  Homicidal Thoughts:Homicidal Thoughts: No   Sensorium  Memory: Immediate Good  Judgment: Fair  Insight: Fair   Chartered certified accountant: Fair  Attention Span: Fair  Recall: [employer] of Knowledge: Fair  Language: Fair  Psychomotor Activity  Psychomotor Activity: Psychomotor Activity: Normal  Assets  Assets: Manufacturing systems engineer; Resilience  Sleep  Sleep: Sleep: Good  Physical Exam: Physical Exam Constitutional:      Appearance: Normal appearance.  HENT:     Head: Normocephalic.     Nose: Nose normal. No congestion or rhinorrhea.  Eyes:     Pupils: Pupils are equal, round, and reactive to light.  Pulmonary:     Effort: Pulmonary effort is normal.  Musculoskeletal:  General: Normal range of motion.     Cervical back: Normal range of motion.  Neurological:     Mental Status: He is alert and oriented to person, place, and time.    Review of Systems  Constitutional:  Negative for fever.  HENT:  Negative for hearing loss and sore throat.   Respiratory:  Negative for cough.   Cardiovascular:  Negative for chest pain.  Musculoskeletal:  Positive for myalgias.  Neurological: Negative.   Psychiatric/Behavioral:  Positive for depression, substance abuse and suicidal ideas. Negative for hallucinations and memory loss. The patient is nervous/anxious and has insomnia (resolving).    Blood pressure 130/80, pulse 98, temperature 98 F (36.7 C),  temperature source Oral, resp. rate 16, height  (1.727 m), weight 83.9 kg, SpO2 98 %. Body mass index is 28.12 kg/m.  Treatment Plan Summary: Daily contact with patient to assess and evaluate symptoms and progress in treatment and Medication management   Observation Level/Precautions:  15 minute checks  Laboratory:  Labs reviewed on 02/23/2022-Orders placed for HA1C is 7.4, pt is diabetic and on Metformin 850 mg BID, will need PCP or endocrinology f/u after discharge. TSH WNL, Lipid panel mostly WNL with the exception of an LDL-130.  Vitamin D low at 20.2, will replace with vitamin D 50,000 units weekly. and B12 levels WNL. EKG reviewed, QTC-448. Order placed for baseline UA  Psychotherapy:  Unit Group sessions  Medications:  See [facility]  Consultations:  To be determined   Discharge Concerns:  Safety, medication compliance, mood stability  Estimated LOS: 5-7 days  Other:  N/A    PLAN Safety and Monitoring: Involuntary admission to inpatient psychiatric unit for safety, stabilization and treatment Daily contact with patient to assess and evaluate symptoms and progress in treatment Patient's case to be discussed in multi-disciplinary team meeting Observation Level : q15 minute checks Vital signs: q12 hours Precautions:Safety   Long Term Goal(s): Improvement in symptoms so as ready for discharge   Short Term Goals: Ability to identify changes in lifestyle to reduce recurrence of condition will improve, Ability to verbalize feelings will improve, Ability to disclose and discuss suicidal ideas, Ability to identify and develop effective coping behaviors will improve, Ability to maintain clinical measurements within normal limits will improve, Compliance with prescribed medications will improve, and Ability to identify triggers associated with substance abuse/mental health issues will improve   Diagnoses:  Principal Problem:   MDD (major depressive disorder), recurrent severe, without  psychosis (HCC) Active Problems:   Type 2 diabetes mellitus (HCC)   Eczema   Tobacco use disorder   Generalized anxiety disorder   Alcohol use disorder   Cocaine use disorder (HCC)   Severe recurrent major depressive disorder without psychotic features (HCC) -Continue Zoloft 100 mg starting for depressive symptoms   Anxiety -Continue Hydroxyzine 25 mg every 6 hours PRN -Continue Gabapentin to 300 mg TID for pain, anxiety and alcohol use d/o   ETOH use disorder Monitor for signs of withdrawal -Continue Ativan Detox protocol for alcohol use-See MAR  - Oral thiamine and MVI replacement - Abstinence from substances encouraged  - SW to look into options for outpatient SA treatment at discharge  -Continue Ativan  tabs every 6 hours PRNCIWA > 10   Other medications for medical reasons -Start Vitamin D 50,000 units for vitamin D deficiency -Continue Norvasc 10 mg for htn -Continue Metformin 850 mg BID for DM   Other PRNS -Continue Tylenol 650 mg every 6 hours PRN for mild pain -Continue  Maalox 30 mg every 4 hrs PRN for indigestion -Continue Imodium 2-4 mg as needed for diarrhea -Continue Milk of Magnesia as needed every 6 hrs for constipation -Continue Zofran disintegrating tabs every 6 hrs PRN for nausea    Discharge Planning: Social work and case management to assist with discharge planning and identification of hospital follow-up needs prior to discharge Estimated LOS: 5-7 days Discharge Concerns: Need to establish a safety plan; Medication compliance and effectiveness Discharge Goals: Return home with outpatient referrals for mental health follow-up including medication management/psychotherapy   I certify that inpatient services furnished can reasonably be expected to improve the patient's condition.   Starleen Blue, NP 02/23/2022, 1:21 PM

## 2022-02-23 NOTE — Group Note (Signed)
[facility] LCSW Group Therapy Note  02/23/2022  10:00-11:00AM  Type of Therapy and Topic:  Group Therapy:  Grief   Participation Level:  Did Not Attend   Description of Group:  Patients in this group asked to talk about grief during this session.  As the topic was introduced, we talked about grief involving death much of the time, but also that it is the emotion we may experience at the loss of a relationship, job, pet, etc.  Patients were introduced to the well-known Kubler-Ross concept of 5 stages of grief (Denial, Anger, Bargaining, Depression, Acceptance) and the fact that these stages are often not linear and people may go through one or more stages numerous times, although with greater ease each subsequent time.  This led to a discussion about patients' sources of grief and what their experiences have been in going through this process.  Therapeutic Goals: 1)  learn about the 5 stages of grief 2)  share patients' direct experiences in going through these stages  3)  give an opportunity to each patient to say out loud what they would like to say to a person they are currently grieving  4) discuss actions that could possibly help in the grief process such as writing a letter, journaling, listening to music, exercising, saying names out loud, and more   Summary of Patient Progress:  Patient was invited to group, did not attend.   Therapeutic Modalities:   Motivational Interviewing Processing  Neah Sporrer J Grossman-Orr      

## 2022-02-23 NOTE — [facility] Group Notes (Signed)
Psychoeducational Group- Patients were given activity in which they were asked to reflect on negative coping skills and barriers they had to developing positive ones. Patients were educated on the impact of vulnerability and how that can impact mental health.  Pt attended group and was appropriate but would not share.

## 2022-02-23 NOTE — [facility] Group Notes (Signed)
Adult Goals Group Note  Date:  02/23/2022 Time:  11:55 AM  Group Topic/Focus:  Goals Group:   The focus of this group is to help patients establish daily goals to achieve during treatment and discuss how the patient can incorporate goal setting into their daily lives to aide in recovery.  Participation Level:  Did Not Attend 

## 2022-02-24 DIAGNOSIS — F332 Major depressive disorder, recurrent severe without psychotic features: Secondary | ICD-10-CM | POA: Diagnosis not present

## 2022-02-24 MED ORDER — ARIPIPRAZOLE 5 MG PO TABS
5.0000 mg | ORAL_TABLET | Freq: Every day | ORAL | Status: DC
  Administered 2022-02-24 – 2022-02-25 (×2): 5 mg via ORAL
  Filled 2022-02-24 (×2): qty 1

## 2022-02-24 MED ORDER — CLONIDINE HCL 0.1 MG PO TABS: 0.1000 mg | ORAL_TABLET | Freq: Two times a day (BID) | ORAL | Status: DC | PRN

## 2022-02-24 NOTE — Progress Notes (Signed)
[facility] MD Progress Note  02/24/2022 11:38 AM Jeffery Wheeler  MRN:  [id number] Principal Problem: MDD (major depressive disorder), recurrent severe, without psychosis (HCC) Diagnosis: Principal Problem:   MDD (major depressive disorder), recurrent severe, without psychosis (HCC) Active Problems:   Type 2 diabetes mellitus (HCC)   Eczema   Tobacco use disorder   Generalized anxiety disorder   Alcohol use disorder   Cocaine use disorder (HCC)  HPI: Jeffery Wheeler is a 56 yo [place] American male with a prior mental health history of polysubstance abuse (ETOH, cocaine), and history of incarceration x 30 yrs (total) for bank robbery & other charges who was transported to the [facility] with complaints of worsening depressive symptoms and +SI with a plan to overdose on drugs in the context of substance abuse and financial difficulties. Pt was involuntarily transferred to this [facility] for treatment and stabilization of his mood.   24 hr chart review: ` Patient's chart reviewed, his case discussed with his treatment team.  Patient's BP elevated earlier today morning with BP initially 167/91, and repeated and was 154/96. Pt is compliant with all of his scheduled medications, and required the following PRN medications in the past 24 hrs; Hydroxyzine 25 mg given for anxiety, Trazodone 50 mg for sleep and Tylenol 650 mg last night for pain. Pt has attended some unit group sessions in the past 24 hrs. As per unit flow sheets, he slept a total of 7.75 hours last night.  Today's patient assessment note: Today, pt is reporting racing thoughts which prevented him from having a good night's sleep last night. He reports that sleep was poor, and he is presenting today with suicidal ideations, and continues to have a plan to overdose on substances of abuse. He is able to verbally contract for safety here on the unit. He denies HI, denies AVH, but presents with paranoia and states that he thinks that  nonspecific people outside of this hospital are out to harm him. He reports a god appetite, denies being in any physical pain today, reports +BM earlier today morning.  Pt with flat affect and depressed mood, attention to personal hygiene and grooming is continuing to be poor, and the need to tend to personal hygiene and grooming is continuing to be reinforced. Eye contact is poor, speech is clear & coherent. Thought contents are organized and logical.  Pt is tolerating taking the Zoloft at 100 mg daily for depressive symptoms. We are adding Abilify 5 mg daily to medication regimen as an adjunct to help with depressive symptoms and also with paranoia. We are also adding Clonidine BID PRN for SBP>160 or DBP>100. Pt educated on the benefits, rationales and possible side effects of this medication and is agreeable to trials. We are continuing other medications and the Ativan detox protocol as listed below. Pt currently denies any medication related side effects.  Total Time spent with patient: 30 minutes  Past Psychiatric History: MDD, polysubstance abuse  Past Medical History:  Past Medical History:  Diagnosis Date   Eczema    HLD (hyperlipidemia)    HTN (hypertension)    Type 2 diabetes mellitus (HCC)     Past Surgical History:  Procedure Laterality Date   CERVICAL SPINE SURGERY  2018   right shoulder  1991   Family History:  Family History  Problem Relation Age of Onset   Diabetes type II Sister    Diabetes type II Paternal Grandmother    Diabetes type II  Maternal Aunt    Family Psychiatric  History: non reported Social History:  Social History   Substance and Sexual Activity  Alcohol Use Yes   Alcohol/week: 200.0 standard drinks of alcohol   Types: 80 Cans of beer, 120 Shots of liquor per week   Comment: reports periodically drinking as much as 4x 40 oz malt liquer and 1x fifth of tequila daily as fund permit     Social History   Substance and Sexual Activity  Drug Use Yes    Types: "Crack" cocaine, Methamphetamines, Marijuana, Fentanyl   Comment: Consistantly using crack cocaine almost on a daily basis, cannabis occasionally, other drugs as presented to him by other drug users    Social History   Socioeconomic History   Marital status: Single    Spouse name: Not on file   Number of children: Not on file   Years of education: Not on file   Highest education level: Not on file  Occupational History   Not on file  Tobacco Use   Smoking status: Never   Smokeless tobacco: Never  Substance and Sexual Activity   Alcohol use: Yes    Alcohol/week: 200.0 standard drinks of alcohol    Types: 80 Cans of beer, 120 Shots of liquor per week    Comment: reports periodically drinking as much as 4x 40 oz malt liquer and 1x fifth of tequila daily as fund permit   Drug use: Yes    Types: "Crack" cocaine, Methamphetamines, Marijuana, Fentanyl    Comment: Consistantly using crack cocaine almost on a daily basis, cannabis occasionally, other drugs as presented to him by other drug users   Sexual activity: Yes    Partners: Female  Other Topics Concern   Not on file  Social History Narrative   ** Merged History Encounter **       Social Determinants of Health   Financial Resource Strain: Not on file  Food Insecurity: Food Insecurity Present (02/21/2022)   Hunger Vital Sign    Worried About Running Out of Food in the Last Year: Often true    Ran Out of Food in the Last Year: Sometimes true  Transportation Needs: Unmet Transportation Needs (02/21/2022)   PRAPARE - Hydrologist (Medical): Yes    Lack of Transportation (Non-Medical): Yes  Physical Activity: Not on file  Stress: Not on file  Social Connections: Not on file   Additional Social History:   Sleep: Good  Appetite:  Good  Current Medications: Current Facility-Administered Medications  Medication Dose Route Frequency Provider Last Rate Last Admin   acetaminophen (TYLENOL)  tablet 650 mg  650 mg Oral Q6H PRN Lindon Romp A, NP   650 mg at 02/23/22 1937   alum & mag hydroxide-simeth (MAALOX/MYLANTA) 200-200-20 MG/5ML suspension 30 mL  30 mL Oral Q4H PRN Lindon Romp A, NP   30 mL at 02/23/22 0124   amLODipine (NORVASC) tablet 10 mg  10 mg Oral Daily Lindon Romp A, NP   10 mg at 02/24/22 0749   ARIPiprazole (ABILIFY) tablet 5 mg  5 mg Oral Daily Vincent Ehrler, NP       atorvastatin (LIPITOR) tablet 20 mg  20 mg Oral Daily Lindon Romp A, NP   20 mg at 02/24/22 0749   cloNIDine (CATAPRES) tablet 0.1 mg  0.1 mg Oral BID PRN Nicholes Rough, NP       gabapentin (NEURONTIN) capsule 300 mg  300 mg Oral TID Nicholes Rough, NP  300 mg at 02/24/22 0749   hydrOXYzine (ATARAX) tablet 25 mg  25 mg Oral TID PRN Jackelyn Poling, NP   25 mg at 02/23/22 2112   loperamide (IMODIUM) capsule 2-4 mg  2-4 mg Oral PRN Jackelyn Poling, NP       LORazepam (ATIVAN) tablet 1 mg  1 mg Oral Q6H PRN Jackelyn Poling, NP       LORazepam (ATIVAN) tablet 1 mg  1 mg Oral BID Starleen Blue, NP       Followed by   Melene Muller ON 02/26/2022] LORazepam (ATIVAN) tablet 1 mg  1 mg Oral Daily Sonya Gunnoe, NP       magnesium hydroxide (MILK OF MAGNESIA) suspension 30 mL  30 mL Oral Daily PRN Nira Conn A, NP       metFORMIN (GLUCOPHAGE) tablet 850 mg  850 mg Oral BID WC Nira Conn A, NP   850 mg at 02/24/22 0748   multivitamin with minerals tablet 1 tablet  1 tablet Oral Daily Nira Conn A, NP   1 tablet at 02/24/22 1610   neomycin-bacitracin-polymyxin (NEOSPORIN) ointment   Topical BID Starleen Blue, NP   Given at 02/23/22 1817   ondansetron (ZOFRAN-ODT) disintegrating tablet 4 mg  4 mg Oral Q6H PRN Jackelyn Poling, NP       sertraline (ZOLOFT) tablet 100 mg  100 mg Oral Daily Elba Dendinger, Tyler Aas, NP   100 mg at 02/24/22 0748   thiamine (Vitamin B-1) tablet 100 mg  100 mg Oral Daily Nira Conn A, NP   100 mg at 02/24/22 0748   traZODone (DESYREL) tablet 50 mg  50 mg Oral QHS PRN Jackelyn Poling, NP   50 mg  at 02/23/22 2112   triamcinolone ointment (KENALOG) 0.1 %   Topical BID Starleen Blue, NP   Given at 02/23/22 2111   Vitamin D (Ergocalciferol) (DRISDOL) 1.25 MG (50000 UNIT) capsule 50,000 Units  50,000 Units Oral Q7 days Starleen Blue, NP   50,000 Units at 02/23/22 1350    Lab Results:  Results for orders placed or performed during the hospital encounter of 02/21/22 (from the past 48 hour(s))  Glucose, capillary     Status: Abnormal   Collection Time: 02/23/22  5:45 AM  Result Value Ref Range   Glucose-Capillary 122 (H) 70 - 99 mg/dL    Comment: Glucose reference range applies only to samples taken after fasting for at least 8 hours.  Lipid panel     Status: None   Collection Time: 02/23/22  6:43 AM  Result Value Ref Range   Cholesterol 140 0 - 200 mg/dL   Triglycerides 64 <960 mg/dL   HDL 42 >45 mg/dL   Total CHOL/HDL Ratio 3.3 RATIO   VLDL 13 0 - 40 mg/dL   LDL Cholesterol 85 0 - 99 mg/dL    Comment:        Total Cholesterol/HDL:CHD Risk Coronary Heart Disease Risk Table                     Men   Women  1/2 Average Risk   3.4   3.3  Average Risk       5.0   4.4  2 X Average Risk   9.6   7.1  3 X Average Risk  23.4   11.0        Use the calculated Patient Ratio above and the CHD Risk Table to determine the patient's CHD Risk.  ATP III CLASSIFICATION (LDL):  <100     mg/dL   Optimal  026-378  mg/dL   Near or Above                    Optimal  130-159  mg/dL   Borderline  588-502  mg/dL   High  >774     mg/dL   Very High Performed at [facility], [address], [place], [place] [address]   Hemoglobin A1c     Status: Abnormal   Collection Time: 02/23/22  6:43 AM  Result Value Ref Range   Hgb A1c MFr Bld 7.4 (H) 4.8 - 5.6 %    Comment: (NOTE) Pre diabetes:          5.7%-6.4%  Diabetes:              >6.4%  Glycemic control for   <7.0% adults with diabetes    Mean Plasma Glucose 165.68 mg/dL    Comment: Performed at [facility]  Lab, [address] [place], [place] [address]  VITAMIN D 25 Hydroxy (Vit-D Deficiency, Fractures)     Status: Abnormal   Collection Time: 02/23/22  6:43 AM  Result Value Ref Range   Vit D, 25-Hydroxy 29.00 (L) 30 - 100 ng/mL    Comment: (NOTE) Vitamin D deficiency has been defined by the Institute of Medicine  and an Endocrine Society practice guideline as a level of serum 25-OH  vitamin D less than 20 ng/mL (1,2). The Endocrine Society went on to  further define vitamin D insufficiency as a level between 21 and 29  ng/mL (2).  1. IOM (Institute of Medicine). 2010. Dietary reference intakes for  calcium and D. [place]: [employer]. 2. Holick MF, Binkley [place], Bischoff-Ferrari HA, et al. Evaluation,  treatment, and prevention of vitamin D deficiency: an Endocrine  Society clinical practice guideline, JCEM. 2011 Jul; 96(7): 1911-30.  Performed at [facility], [address] [place], [place] [address]   Vitamin B12     Status: None   Collection Time: 02/23/22  6:43 AM  Result Value Ref Range   Vitamin B-12 471 180 - 914 pg/mL    Comment: (NOTE) This assay is not validated for testing neonatal or myeloproliferative syndrome specimens for Vitamin B12 levels. Performed at [facility], [address], [place], [place] [address]   Urinalysis, Routine w reflex microscopic     Status: Abnormal   Collection Time: 02/23/22  1:41 PM  Result Value Ref Range   Color, Urine YELLOW YELLOW   APPearance CLEAR CLEAR   Specific Gravity, Urine 1.010 1.005 - 1.030   pH 6.0 5.0 - 8.0   Glucose, UA 250 (A) NEGATIVE mg/dL   Hgb urine dipstick NEGATIVE NEGATIVE   Bilirubin Urine NEGATIVE NEGATIVE   Ketones, ur NEGATIVE NEGATIVE mg/dL   Protein, ur NEGATIVE NEGATIVE mg/dL   Nitrite NEGATIVE NEGATIVE   Leukocytes,Ua TRACE (A) NEGATIVE    Comment: Performed at [facility], [address] [place], [place] [address]  Urinalysis,  Microscopic (reflex)     Status: None   Collection Time: 02/23/22  1:41 PM  Result Value Ref Range   RBC / HPF NONE SEEN 0 - 5 RBC/hpf   WBC, UA 0-5 0 - 5 WBC/hpf   Bacteria, UA NONE SEEN NONE SEEN   Squamous Epithelial / LPF NONE SEEN 0 - 5    Comment: Performed at [employer] [facility], [address] [place], [place] [address]    Blood Alcohol level:  Lab Results  Component Value Date   ETH <10 02/21/2022   ETH <10 01/23/2022    Metabolic Disorder Labs: Lab Results  Component Value Date   HGBA1C 7.4 (H) 02/23/2022   MPG 165.68 02/23/2022   No results found for: "PROLACTIN" Lab Results  Component Value Date   CHOL 140 02/23/2022   TRIG 64 02/23/2022   HDL 42 02/23/2022   CHOLHDL 3.3 02/23/2022   VLDL 13 02/23/2022   LDLCALC 85 02/23/2022   LDLCALC 76 02/22/2022    Physical Findings: AIMS:  , ,  ,  ,    CIWA:  CIWA-Ar Total: 0 COWS:     Musculoskeletal: Strength & Muscle Tone: within normal limits Gait & Station: normal Patient leans: N/A  Psychiatric Specialty Exam:  Presentation  General Appearance:  Appropriate for Environment; Disheveled  Eye Contact: Poor  Speech: Clear and Coherent  Speech Volume: Normal  Handedness: Right   Mood and Affect  Mood: Depressed  Affect: Congruent   Thought Process  Thought Processes: Coherent  Descriptions of Associations:Intact  Orientation:Full (Time, Place and Person)  Thought Content:Logical  History of Schizophrenia/Schizoaffective disorder:No  Duration of Psychotic Symptoms:No data recorded Hallucinations:Hallucinations: None  Ideas of Reference:Paranoia  Suicidal Thoughts:Suicidal Thoughts: Yes, Active SI Active Intent and/or Plan: Without Intent; With Plan  Homicidal Thoughts:Homicidal Thoughts: No   Sensorium  Memory: Immediate Good  Judgment: Poor  Insight: Fair   Chartered certified accountant: Fair  Attention Span: Fair  Recall: [employer]  of Knowledge: Fair  Language: Fair  Psychomotor Activity  Psychomotor Activity: Psychomotor Activity: Normal  Assets  Assets: Communication Skills  Sleep  Sleep: Sleep: Poor  Physical Exam: Physical Exam Constitutional:      Appearance: Normal appearance.  HENT:     Head: Normocephalic.     Nose: Nose normal. No congestion or rhinorrhea.  Eyes:     Pupils: Pupils are equal, round, and reactive to light.  Pulmonary:     Effort: Pulmonary effort is normal.  Musculoskeletal:        General: Normal range of motion.     Cervical back: Normal range of motion.  Neurological:     Mental Status: He is alert and oriented to person, place, and time.    Review of Systems  Constitutional:  Negative for fever.  HENT:  Negative for hearing loss and sore throat.   Respiratory:  Negative for cough.   Cardiovascular:  Negative for chest pain.  Musculoskeletal:  Negative for myalgias.  Neurological: Negative.   Psychiatric/Behavioral:  Positive for depression, substance abuse and suicidal ideas. Negative for hallucinations and memory loss. The patient is nervous/anxious and has insomnia (resolving).    Blood pressure (!) 154/96, pulse 86, temperature 97.9 F (36.6 C), temperature source Oral, resp. rate 16, height 5\' 8"  (1.727 m), weight 83.9 kg, SpO2 98 %. Body mass index is 28.12 kg/m.  Treatment Plan Summary: Daily contact with patient to assess and evaluate symptoms and progress in treatment and Medication management   Observation Level/Precautions:  15 minute checks  Laboratory:  Labs reviewed on 02/23/2022-Orders placed for HA1C is 7.4, pt is diabetic and on Metformin 850 mg BID, will need PCP or endocrinology f/u after discharge. TSH WNL, Lipid panel mostly WNL with the exception of an LDL-130.  Vitamin D low at 20.2, will replace with vitamin D 50,000 units weekly. and B12 levels WNL. EKG reviewed,  [id number]. Order placed for baseline UA  Psychotherapy:  Unit Group sessions   [facility]  [facility]  Consultations:  To be determined   Discharge Concerns:  Safety, medication compliance, mood stability  Estimated LOS: 5-7 days  Other:  N/A    PLAN Safety and Monitoring: Involuntary admission to inpatient psychiatric unit for safety, stabilization and treatment Daily contact with patient to assess and evaluate symptoms and progress in treatment Patient's case to be discussed in multi-disciplinary team meeting Observation Level : q15 minute checks Vital signs: q12 hours Precautions:Safety   Long Term Goal(s): Improvement in symptoms so as ready for discharge   Short Term Goals: Ability to identify changes in lifestyle to reduce recurrence of condition will improve, Ability to verbalize feelings will improve, Ability to disclose and discuss suicidal ideas, Ability to identify and develop effective coping behaviors will improve, Ability to maintain clinical measurements within normal limits will improve, Compliance with prescribed medications will improve, and Ability to identify triggers associated with substance abuse/mental health issues will improve   Diagnoses:  Principal Problem:   MDD (major depressive disorder), recurrent severe, without psychosis (HCC) Active Problems:   Type 2 diabetes mellitus (HCC)   Eczema   Tobacco use disorder   Generalized anxiety disorder   Alcohol use disorder   Cocaine use disorder (HCC)   Severe recurrent major depressive disorder without psychotic features (HCC) -Start Abilify 5 mg daily for treatment of paranoia and racing thoughts & as an adjunct to management of depressive symptoms. (Pt educated on benefits, rationales and possible side effects of medication including metabolic syndrome and prolonged QTC. Lipid panel WNL, TSH WNL, hemoglobin A1C-7.1, has hx of DM, with poor med compliance. Educated on the need to f/u with PCP and endocrinology after discharge). -Continue Zoloft 100 mg starting for depressive symptoms    Anxiety -Continue Hydroxyzine 25 mg every 6 hours PRN -Continue Gabapentin to 300 mg TID for pain, anxiety and alcohol use d/o   ETOH use disorder Monitor for signs of withdrawal -Continue Ativan Detox protocol for alcohol use-See MAR  - Oral thiamine and MVI replacement - Abstinence from substances encouraged  - SW to look into options for outpatient SA treatment at discharge  -Continue Ativan 1mg  tabs every 6 hours PRNCIWA > 10   Other medications for medical reasons -Start Clonidine 0.1 mg for SBP>160 or DBP >100 -Continue Vitamin D 50,000 units for vitamin D deficiency -Continue Norvasc 10 mg for htn -Continue Metformin 850 mg BID for DM   Other PRNS -Continue Tylenol 650 mg every 6 hours PRN for mild pain -Continue Maalox 30 mg every 4 hrs PRN for indigestion -Continue Imodium 2-4 mg as needed for diarrhea -Continue Milk of Magnesia as needed every 6 hrs for constipation -Continue Zofran disintegrating tabs every 6 hrs PRN for nausea    Discharge Planning: Social work and case management to assist with discharge planning and identification of hospital follow-up needs prior to discharge Estimated LOS: 5-7 days Discharge Concerns: Need to establish a safety plan; Medication compliance and effectiveness Discharge Goals: Return home with outpatient referrals for mental health follow-up including medication management/psychotherapy   I certify that inpatient services furnished can reasonably be expected to improve the patient's condition.   Starleen Blueoris  Jordell Outten, NP 02/24/2022, 11:38 AMPatient ID: Jeffery Wheeler, male   DOB: 06/15/1965, 56 y.o.   MRN: [id number]

## 2022-02-24 NOTE — BHH Group Notes (Signed)
Psychoeducational group- Patients were given two poems to read:  Adrian Prince titled '' The Henreitta Leber and the [place] '' and '' There's a hole in my sidewalk '' by Cristopher Peru.  The first poem discussed fight or flight response in comparison to the owl and the chimp. The patients were asked to identify times in their lives in which they behaved like the owl and the chimp and how they could identify triggers, and then healthy coping mechanisms. The patients were then asked to discuss the second poem and how it related to negative behavioral patterns and identifying ways for healthier coping.  Pt partially attended, left early after being called on and did not participate.

## 2022-02-24 NOTE — Plan of Care (Signed)
  Problem: Education: Goal: Knowledge of [facility] Education information/materials will improve Outcome: Progressing Goal: Emotional status will improve Outcome: Progressing Goal: Mental status will improve Outcome: Progressing Goal: Verbalization of understanding the information provided will improve Outcome: Progressing   Problem: Activity: Goal: Interest or engagement in activities will improve Outcome: Progressing Goal: Sleeping patterns will improve Outcome: Progressing   Problem: Coping: Goal: Ability to verbalize frustrations and anger appropriately will improve Outcome: Progressing Goal: Ability to demonstrate self-control will improve Outcome: Progressing   

## 2022-02-24 NOTE — Group Note (Signed)
LCSW Group Therapy Note  02/24/2022      Type of Therapy and Topic:  Group Therapy: Gratitude  Participation Level:  Did Not Attend   Description of Group:   In this group, patients shared and discussed the importance of acknowledging the elements in their lives for which they are grateful and how this can positively impact their mood.  The group discussed how bringing the positive elements of their lives to the forefront of their minds can help with recovery from any illness, physical or mental.  An exercise was done as a group in which a list was made of gratitude items in order to encourage participants to consider other potential positives in their lives.  Therapeutic Goals: Patients will identify one or more item for which they are grateful in each of 6 categories:  people, experiences, things, places, skills, and other. Patients will discuss how it is possible to seek out gratitude in even bad situations. Patients will explore other possible items of gratitude that they could remember.   Summary of Patient Progress:  Patient was invited to group, did not attend.   Therapeutic Modalities:   Solution-Focused Therapy Activity  Anagabriela Jokerst J Grossman-Orr, LCSW .  

## 2022-02-24 NOTE — BHH Counselor (Signed)
Clinical Social Work Note  CSW called [employer] to verify they received the referral sent on 10/21.  The team will not return until Monday to process that, and they will call the weekday social worker to verify and to ask for any additional information that might be needed.  Selmer Dominion, LCSW 02/24/2022, 2:58 PM

## 2022-02-24 NOTE — Progress Notes (Signed)
Adult Psychoeducational Group Note  Date:  02/24/2022 Time:  8:57 PM  Group Topic/Focus:  Wrap-Up Group:   The focus of this group is to help patients review their daily goal of treatment and discuss progress on daily workbooks.  Participation Level:  Minimal  Participation Quality:  Attentive  Affect:  Flat  Cognitive:  Appropriate  Insight: None  Engagement in Group:  Lacking  Modes of Intervention:  Discussion and Support  Additional Comments:   Pt attended and was attentive during the Wrap Up group. Pt denied SI/HI/AVH. Pt did not rate his day. He stated "It was fucked up". Pt did not disclose about his triggers, or coping skills.   Jeffery Wheeler 02/24/2022, 8:57 PM

## 2022-02-24 NOTE — Progress Notes (Signed)
Pt presents with irritable mood, affect congruent. Jeffery Wheeler is very agitated this am upon approach, speaking abruptly to Probation officer in short sentences and not forthcoming of thoughts or willing to elaborate when writer asks questions. He states '' I'm having bad thoughts. '' When asked to explain he states ' I don't want to talk about, just bad thoughts. ''  Pt denies any SI with Probation officer but reportedly reported SI to NP. Pt has been able to make his needs known and has shown some improvement as he has been out of his room and less isolative today. Pt denies any AH or VH with writer but unclear whether these '' bad thoughts '' are more intrusive thoughts or hallucinations as he will not give detail.  Pt has been compliant with medications. Eating and drinking well and able to make his needs known. Pt is safe, will con't to monitor.

## 2022-02-25 DIAGNOSIS — F332 Major depressive disorder, recurrent severe without psychotic features: Secondary | ICD-10-CM | POA: Diagnosis not present

## 2022-02-25 LAB — GLUCOSE, CAPILLARY: Glucose-Capillary: 105 mg/dL — ABNORMAL HIGH (ref 70–99)

## 2022-02-25 MED ORDER — ARIPIPRAZOLE 10 MG PO TABS
10.0000 mg | ORAL_TABLET | Freq: Every day | ORAL | Status: DC
  Administered 2022-02-26 – 2022-02-28 (×3): 10 mg via ORAL
  Filled 2022-02-25 (×4): qty 1

## 2022-02-25 MED ORDER — LIDOCAINE 5 % EX PTCH
1.0000 | MEDICATED_PATCH | CUTANEOUS | Status: DC
  Administered 2022-02-25 – 2022-02-27 (×3): 1 via TRANSDERMAL
  Filled 2022-02-25 (×4): qty 1

## 2022-02-25 MED ORDER — TRAZODONE HCL 50 MG PO TABS
50.0000 mg | ORAL_TABLET | Freq: Every day | ORAL | Status: DC
  Administered 2022-02-25: 50 mg via ORAL
  Filled 2022-02-25 (×3): qty 1

## 2022-02-25 MED ORDER — CLONIDINE HCL 0.1 MG PO TABS
0.1000 mg | ORAL_TABLET | Freq: Once | ORAL | Status: AC
  Administered 2022-02-25: 0.1 mg via ORAL
  Filled 2022-02-25: qty 1

## 2022-02-25 NOTE — Progress Notes (Signed)
[facility] MD Progress Note  02/25/2022 12:24 PM Jeffery Wheeler  MRN:  [id number] Principal Problem: MDD (major depressive disorder), recurrent severe, without psychosis (HCC) Diagnosis: Principal Problem:   MDD (major depressive disorder), recurrent severe, without psychosis (HCC) Active Problems:   Type 2 diabetes mellitus (HCC)   Eczema   Tobacco use disorder   Generalized anxiety disorder   Alcohol use disorder   Cocaine use disorder (HCC)  HPI: Jeffery Wheeler is a 56 yo [place] American male with a prior mental health history of polysubstance abuse (ETOH, cocaine), and history of incarceration x 30 yrs (total) for bank robbery & other charges who was transported to the [facility] with complaints of worsening depressive symptoms and +SI with a plan to overdose on drugs in the context of substance abuse and financial difficulties. Pt was involuntarily transferred to this [facility] for treatment and stabilization of his mood.   24 hr chart review: ` Patient's chart reviewed, his case discussed with his treatment team.  Patient's BP continues to be elevated with BP earlier today morning being 160/101 and repeated was 143/86, and HR-101. Clonidine was ordered yesterday for SBP>160 and DBP >100. Pt's assigned RN will be notified. Pt continues to be compliant with his scheduled medications. Only PRN medication taken in the last 24 hrs has been Tylenol 650 mg earlier today morning for pain. Pt has attended some unit group sessions in the past 24 hrs, participated and was appropriate.   Today's patient assessment note: Today, pt is reporting that his sleep quality last night was poor. He reports appetite as being fair, and reports a headache along with lower back pain. He rates his headache as 5 (10 being worst), and reports lower back pain as 8. Agreeable to an order of a Lidoderm patch for lower back pain. One time dose of Clonidine ordered for elevated BP. We will continue to monitor BP as  pain and anxiety may be causing elevated BP. Q4 H vitals ordered during awake hours to monitor. CIWA completed by writer-8, so withdrawals from alcohol might also be causing elevated BP.  Pt presents with passive SI today. He initially denied suicidal ideations, but when asked if it would be okay with him if he slept and didn't get up the next day, he answered affirmatively to this. He denies having any plans or intent to self harm,verbally contracts for safety on the unit.He denies HI, denies AVH, denies paranoia and other 1st rank symptoms today. He denies any medication related side effects. Mood remains depressed and affect is congruent. Attention to personal hygiene and grooming is poor, pt given positive reinforcements to take a shower. Verbalized understanding.  As per [facility] pt did not receive Trazodone last night, which is why he reports poor sleep. We will schedule the Trazodone for tonight, Lidoderm patch for back pain, Clonidine 0.1 mg x 1 dose for elevated BP, and Clonidine 0.1 mg PRN for SBP>160 and DBP > 100, Abilify increased to 10 mg daily starting tomorrow (10/24), and continue other medications as listed below.  Total Time spent with patient: 30 minutes  Past Psychiatric History: MDD, polysubstance abuse  Past Medical History:  Past Medical History:  Diagnosis Date   Eczema    HLD (hyperlipidemia)    HTN (hypertension)    Type 2 diabetes mellitus (HCC)     Past Surgical History:  Procedure Laterality Date   CERVICAL SPINE SURGERY  2018   right shoulder  1991  Family History:  Family History  Problem Relation Age of Onset   Diabetes type II Sister    Diabetes type II Paternal Grandmother    Diabetes type II Maternal Aunt    Family Psychiatric  History: non reported Social History:  Social History   Substance and Sexual Activity  Alcohol Use Yes   Alcohol/week: 200.0 standard drinks of alcohol   Types: 80 Cans of beer, 120 Shots of liquor per week   Comment:  reports periodically drinking as much as 4x 40 oz malt liquer and 1x fifth of tequila daily as fund permit     Social History   Substance and Sexual Activity  Drug Use Yes   Types: "Crack" cocaine, Methamphetamines, Marijuana, Fentanyl   Comment: Consistantly using crack cocaine almost on a daily basis, cannabis occasionally, other drugs as presented to him by other drug users    Social History   Socioeconomic History   Marital status: Single    Spouse name: Not on file   Number of children: Not on file   Years of education: Not on file   Highest education level: Not on file  Occupational History   Not on file  Tobacco Use   Smoking status: Never   Smokeless tobacco: Never  Substance and Sexual Activity   Alcohol use: Yes    Alcohol/week: 200.0 standard drinks of alcohol    Types: 80 Cans of beer, 120 Shots of liquor per week    Comment: reports periodically drinking as much as 4x 40 oz malt liquer and 1x fifth of tequila daily as fund permit   Drug use: Yes    Types: "Crack" cocaine, Methamphetamines, Marijuana, Fentanyl    Comment: Consistantly using crack cocaine almost on a daily basis, cannabis occasionally, other drugs as presented to him by other drug users   Sexual activity: Yes    Partners: Female  Other Topics Concern   Not on file  Social History Narrative   ** Merged History Encounter **       Social Determinants of Health   Financial Resource Strain: Not on file  Food Insecurity: Food Insecurity Present (02/21/2022)   Hunger Vital Sign    Worried About Running Out of Food in the Last Year: Often true    Ran Out of Food in the Last Year: Sometimes true  Transportation Needs: Unmet Transportation Needs (02/21/2022)   PRAPARE - Administrator, Civil Service (Medical): Yes    Lack of Transportation (Non-Medical): Yes  Physical Activity: Not on file  Stress: Not on file  Social Connections: Not on file   Additional Social History:   Sleep:  Good  Appetite:  Good  Current Medications: Current Facility-Administered Medications  Medication Dose Route Frequency Provider Last Rate Last Admin   acetaminophen (TYLENOL) tablet 650 mg  650 mg Oral Q6H PRN Nira Conn A, NP   650 mg at 02/25/22 0807   alum & mag hydroxide-simeth (MAALOX/MYLANTA) 200-200-20 MG/5ML suspension 30 mL  30 mL Oral Q4H PRN Nira Conn A, NP   30 mL at 02/23/22 0124   amLODipine (NORVASC) tablet 10 mg  10 mg Oral Daily Nira Conn A, NP   10 mg at 02/25/22 0804   [START ON 02/26/2022] ARIPiprazole (ABILIFY) tablet 10 mg  10 mg Oral Daily Chlora Mcbain, NP       atorvastatin (LIPITOR) tablet 20 mg  20 mg Oral Daily Nira Conn A, NP   20 mg at 02/25/22 0803   cloNIDine (  CATAPRES) tablet 0.1 mg  0.1 mg Oral BID PRN Starleen Blue, NP       cloNIDine (CATAPRES) tablet 0.1 mg  0.1 mg Oral Once Starleen Blue, NP       gabapentin (NEURONTIN) capsule 300 mg  300 mg Oral TID Starleen Blue, NP   300 mg at 02/25/22 3220   hydrOXYzine (ATARAX) tablet 25 mg  25 mg Oral TID PRN Jackelyn Poling, NP   25 mg at 02/23/22 2112   lidocaine (LIDODERM) 5 % 1 patch  1 patch Transdermal Q24H Starleen Blue, NP       [START ON 02/26/2022] LORazepam (ATIVAN) tablet 1 mg  1 mg Oral Daily Tahtiana Rozier, NP       magnesium hydroxide (MILK OF MAGNESIA) suspension 30 mL  30 mL Oral Daily PRN Jackelyn Poling, NP       metFORMIN (GLUCOPHAGE) tablet 850 mg  850 mg Oral BID WC Nira Conn A, NP   850 mg at 02/25/22 0804   multivitamin with minerals tablet 1 tablet  1 tablet Oral Daily Jackelyn Poling, NP   1 tablet at 02/25/22 2542   neomycin-bacitracin-polymyxin (NEOSPORIN) ointment   Topical BID Starleen Blue, NP   Given at 02/25/22 7062   sertraline (ZOLOFT) tablet 100 mg  100 mg Oral Daily Starleen Blue, NP   100 mg at 02/25/22 3762   thiamine (Vitamin B-1) tablet 100 mg  100 mg Oral Daily Nira Conn A, NP   100 mg at 02/25/22 0804   traZODone (DESYREL) tablet 50 mg  50 mg Oral QHS  Starleen Blue, NP       triamcinolone ointment (KENALOG) 0.1 %   Topical BID Starleen Blue, NP   Given at 02/25/22 0900   Vitamin D (Ergocalciferol) (DRISDOL) 1.25 MG (50000 UNIT) capsule 50,000 Units  50,000 Units Oral Q7 days Starleen Blue, NP   50,000 Units at 02/23/22 1350    Lab Results:  Results for orders placed or performed during the hospital encounter of 02/21/22 (from the past 48 hour(s))  Urinalysis, Routine w reflex microscopic     Status: Abnormal   Collection Time: 02/23/22  1:41 PM  Result Value Ref Range   Color, Urine YELLOW YELLOW   APPearance CLEAR CLEAR   Specific Gravity, Urine 1.010 1.005 - 1.030   pH 6.0 5.0 - 8.0   Glucose, UA 250 (A) NEGATIVE mg/dL   Hgb urine dipstick NEGATIVE NEGATIVE   Bilirubin Urine NEGATIVE NEGATIVE   Ketones, ur NEGATIVE NEGATIVE mg/dL   Protein, ur NEGATIVE NEGATIVE mg/dL   Nitrite NEGATIVE NEGATIVE   Leukocytes,Ua TRACE (A) NEGATIVE    Comment: Performed at [facility], [address] [place], [place] [address]  Urinalysis, Microscopic (reflex)     Status: None   Collection Time: 02/23/22  1:41 PM  Result Value Ref Range   RBC / HPF NONE SEEN 0 - 5 RBC/hpf   WBC, UA 0-5 0 - 5 WBC/hpf   Bacteria, UA NONE SEEN NONE SEEN   Squamous Epithelial / LPF NONE SEEN 0 - 5    Comment: Performed at [facility], [address] [place], [place] [address]  Glucose, capillary     Status: Abnormal   Collection Time: 02/25/22  5:58 AM  Result Value Ref Range   Glucose-Capillary 105 (H) 70 - 99 mg/dL    Comment: Glucose reference range applies only to samples taken after fasting for at least 8 hours.   Comment 1  Notify RN    Comment 2 Document in Chart     Blood Alcohol level:  Lab Results  Component Value Date   ETH <10 02/21/2022   ETH <10 52/84/1324    Metabolic Disorder Labs: Lab Results  Component Value Date   HGBA1C 7.4 (H) 02/23/2022   MPG 165.68 02/23/2022   No results found for:  "PROLACTIN" Lab Results  Component Value Date   CHOL 140 02/23/2022   TRIG 64 02/23/2022   HDL 42 02/23/2022   CHOLHDL 3.3 02/23/2022   VLDL 13 02/23/2022   LDLCALC 85 02/23/2022   LDLCALC 76 02/22/2022    Physical Findings: AIMS:  , ,  ,  ,    CIWA:  CIWA-Ar Total: 8 COWS:     Musculoskeletal: Strength & Muscle Tone: within normal limits Gait & Station: normal Patient leans: N/A  Psychiatric Specialty Exam:  Presentation  General Appearance:  Disheveled  Eye Contact: Fair  Speech: Clear and Coherent  Speech Volume: Normal  Handedness: Right   Mood and Affect  Mood: Depressed  Affect: Congruent   Thought Process  Thought Processes: Coherent  Descriptions of Associations:Intact  Orientation:Full (Time, Place and Person)  Thought Content:Logical  History of Schizophrenia/Schizoaffective disorder:No  Duration of Psychotic Symptoms:No data recorded Hallucinations:Hallucinations: None  Ideas of Reference:None  Suicidal Thoughts:Suicidal Thoughts: Yes, Passive SI Active Intent and/or Plan: Without Plan; Without Intent  Homicidal Thoughts:Homicidal Thoughts: No   Sensorium  Memory: Immediate Good  Judgment: Fair  Insight: Fair   Materials engineer: Fair  Attention Span: Fair  Recall: Good  Fund of Knowledge: Fair  Language: Fair  Psychomotor Activity  Psychomotor Activity: Psychomotor Activity: Normal  Assets  Assets: Resilience  Sleep  Sleep: Sleep: Poor  Physical Exam: Physical Exam Constitutional:      Appearance: Normal appearance.  HENT:     Head: Normocephalic.     Nose: Nose normal. No congestion or rhinorrhea.  Eyes:     Pupils: Pupils are equal, round, and reactive to light.  Pulmonary:     Effort: Pulmonary effort is normal.  Musculoskeletal:        General: Normal range of motion.     Cervical back: Normal range of motion.  Neurological:     Mental Status: He is alert and  oriented to person, place, and time.    Review of Systems  Constitutional:  Negative for fever.  HENT:  Negative for hearing loss and sore throat.   Respiratory:  Negative for cough.   Cardiovascular:  Negative for chest pain.  Musculoskeletal:  Negative for myalgias.  Neurological: Negative.   Psychiatric/Behavioral:  Positive for depression, substance abuse and suicidal ideas. Negative for hallucinations and memory loss. The patient is nervous/anxious and has insomnia (resolving).    Blood pressure (!) 143/86, pulse (!) 101, temperature 98.4 F (36.9 C), temperature source Oral, resp. rate 17, height 5\' 8"  (1.727 m), weight 83.9 kg, SpO2 99 %. Body mass index is 28.12 kg/m.  Treatment Plan Summary: Daily contact with patient to assess and evaluate symptoms and progress in treatment and Medication management   Observation Level/Precautions:  15 minute checks  Laboratory:  Labs reviewed on 02/23/2022-Orders placed for HA1C is 7.4, pt is diabetic and on Metformin 850 mg BID, will need PCP or endocrinology f/u after discharge. TSH WNL, Lipid panel mostly WNL with the exception of an LDL-130.  Vitamin D low at 20.2, will replace with vitamin D 50,000 units weekly. and B12 levels WNL. EKG reviewed,  [id number]. Order placed for baseline UA  Psychotherapy:  Unit Group sessions  [facility]  Consultations:  To be determined   Discharge Concerns:  Safety, medication compliance, mood stability  Estimated LOS: 5-7 days  Other:  N/A    PLAN Safety and Monitoring: Involuntary admission to inpatient psychiatric unit for safety, stabilization and treatment Daily contact with patient to assess and evaluate symptoms and progress in treatment Patient's case to be discussed in multi-disciplinary team meeting Observation Level : q15 minute checks Vital signs: q12 hours Precautions:Safety   Long Term Goal(s): Improvement in symptoms so as ready for discharge   Short Term Goals: Ability to  identify changes in lifestyle to reduce recurrence of condition will improve, Ability to verbalize feelings will improve, Ability to disclose and discuss suicidal ideas, Ability to identify and develop effective coping behaviors will improve, Ability to maintain clinical measurements within normal limits will improve, Compliance with prescribed medications will improve, and Ability to identify triggers associated with substance abuse/mental health issues will improve   Diagnoses:  Principal Problem:   MDD (major depressive disorder), recurrent severe, without psychosis (HCC) Active Problems:   Type 2 diabetes mellitus (HCC)   Eczema   Tobacco use disorder   Generalized anxiety disorder   Alcohol use disorder   Cocaine use disorder (HCC)   Severe recurrent major depressive disorder without psychotic features (HCC) -Increase Abilify to 10 mg daily  starting 10/24 for treatment of paranoia and racing thoughts & as an adjunct to management of depressive symptoms. (Pt educated on benefits, rationales and possible side effects of medication including metabolic syndrome and prolonged QTC. Lipid panel WNL, TSH WNL, hemoglobin A1C-7.1, has hx of DM, with poor med compliance. Educated on the need to f/u with PCP and endocrinology after discharge). -Continue Zoloft 100 mg starting for depressive symptoms   Anxiety -Continue Hydroxyzine 25 mg every 6 hours PRN -Continue Gabapentin to 300 mg TID for pain, anxiety and alcohol use d/o   ETOH use disorder Monitor for signs of withdrawal -Continue Ativan Detox protocol for alcohol use-See MAR  - Oral thiamine and MVI replacement - Abstinence from substances encouraged  - SW to look into options for outpatient SA treatment at discharge  -Continue Ativan 1mg  tabs every 6 hours PRNCIWA > 10   Other medications for medical reasons -Give Clonidine 0.1 mg x 1 dose for elevated BP -Continue Clonidine 0.1 mg for SBP>160 or DBP >100 -Continue Vitamin D 50,000  units for vitamin D deficiency -Continue Norvasc 10 mg for htn -Continue Metformin 850 mg BID for DM   Other PRNS -Continue Tylenol 650 mg every 6 hours PRN for mild pain -Continue Maalox 30 mg every 4 hrs PRN for indigestion -Continue Imodium 2-4 mg as needed for diarrhea -Continue Milk of Magnesia as needed every 6 hrs for constipation -Continue Zofran disintegrating tabs every 6 hrs PRN for nausea    Discharge Planning: Social work and case management to assist with discharge planning and identification of hospital follow-up needs prior to discharge Estimated LOS: 5-7 days Discharge Concerns: Need to establish a safety plan; Medication compliance and effectiveness Discharge Goals: Return home with outpatient referrals for mental health follow-up including medication management/psychotherapy   I certify that inpatient services furnished can reasonably be expected to improve the patient's condition.   Starleen Blueoris  Baylen Dea, NP 02/25/2022, 12:24 PMPatient ID: Jeffery Wheeler, male   DOB: 06/29/1965, 56 y.o.   MRN: [id number] Patient ID: Jeffery Wheeler, male   DOB: 02/18/1966,  56 y.o.   MRN: [id number]

## 2022-02-25 NOTE — Plan of Care (Signed)
Nurse discussed anxiety, depression and coping skills with patient.  

## 2022-02-25 NOTE — Progress Notes (Signed)
Adult Psychoeducational Group Note  Date:  02/25/2022 Time:  9:15 PM  Group Topic/Focus:  AA Group  Participation Level:  Participation Quality:  Attentive  Affect:  Appropriate  Cognitive:  Appropriate  Insight: None  Engagement in Group:  None  Modes of Intervention:  Discussion, Education, and Support  Additional Comments:   Pt attended the [facility] group. Pt was calm, attentive and cooperative during the group.  Jeffery Wheeler 02/25/2022, 9:15 PM

## 2022-02-25 NOTE — Progress Notes (Signed)
   02/25/22 2312  Psych Admission Type (Psych Patients Only)  Admission Status Involuntary  Psychosocial Assessment  Patient Complaints Anxiety;Insomnia  Eye Contact Fair  Facial Expression Anxious  Affect Appropriate to circumstance  Speech Logical/coherent  Interaction Assertive  Motor Activity Other (Comment) (WDL)  Appearance/Hygiene Disheveled  Behavior Characteristics Cooperative  Mood Depressed;Anxious  Thought Process  Coherency WDL  Content Blaming self  Delusions None reported or observed  Perception WDL  Hallucination None reported or observed  Judgment WDL  Confusion None  Danger to Self  Current suicidal ideation? Denies  Self-Injurious Behavior No self-injurious ideation or behavior indicators observed or expressed   Agreement Not to Harm Self Yes  Description of Agreement verbal contract for safety  Danger to Others  Danger to Others None reported or observed   D: Patient calm but c/o not sleeping. A: Medications administered as prescribed. Support and encouragement provided as needed.  R: Patient remains safe on the unit. Plan of care ongoing for safety and stability. Pt expresses no other needs at this time.

## 2022-02-25 NOTE — Group Note (Signed)

## 2022-02-25 NOTE — Plan of Care (Addendum)
Patient has poor sleep, no sleep medication.  Good appetite, low energy level, poor concentration.  Rated depression and anxiety 6.  Hopeless 10.  Withdrawals, cravings, irritability.  Denied SI.  Physical problems.  Neck and back pain #6.  Goal is get help.  Plans to ask for help.  No discharge plans. Denied SI and HI, contracts for safety.  Denied A/ hallucinations

## 2022-02-25 NOTE — B[facility] Group Notes (Signed)
Spiritual care group on grief and loss facilitated by chaplain Janne Napoleon, [facility]   Group Goal:   Support / Education around grief and loss   Members engage in facilitated group support and psycho-social education.   Group Description:   Following introductions and group rules, group members engaged in facilitated group dialog and support around topic of loss, with particular support around experiences of loss in their lives. Group Identified types of loss (relationships / self / things) and identified patterns, circumstances, and changes that precipitate losses. Reflected on thoughts / feelings around loss, normalized grief responses, and recognized variety in grief experience. Group noted Worden's four tasks of grief in discussion.   Group drew on Adlerian / Rogerian, narrative, MI,   Patient Progress: Wisdom attended the last part of group.  He showed engagement, but did not verbally participate in conversation.  [address], [place] Pager, [phone]

## 2022-02-26 DIAGNOSIS — F332 Major depressive disorder, recurrent severe without psychotic features: Secondary | ICD-10-CM | POA: Diagnosis not present

## 2022-02-26 LAB — GLUCOSE, CAPILLARY: Glucose-Capillary: 134 mg/dL — ABNORMAL HIGH (ref 70–99)

## 2022-02-26 MED ORDER — TRAZODONE HCL 100 MG PO TABS
100.0000 mg | ORAL_TABLET | Freq: Every day | ORAL | Status: DC
  Administered 2022-02-26 – 2022-02-27 (×2): 100 mg via ORAL
  Filled 2022-02-26 (×3): qty 1

## 2022-02-26 NOTE — BHH Group Notes (Signed)
[place] Group Notes:  (Nursing/MHT/Case Management/Adjunct)  Date:  02/26/2022  Time:  1:20 PM  Type of Therapy:  Goals Group: The focus of this group is to help patients establish daily goals to achieve during treatment and discuss how the patient can incorporate goal setting into their daily lives to aide in recovery.   Participation Level:  Did Not Attend   Summary of Progress/Problems:   Patient did not attend goals group today.   Jeffery Wheeler R Arrick Dutton 02/26/2022, 1:20 PM

## 2022-02-26 NOTE — Progress Notes (Signed)
[facility] MD Progress Note  02/26/2022 12:50 PM Jeffery Wheeler  MRN:  [id number] Principal Problem: MDD (major depressive disorder), recurrent severe, without psychosis (HCC) Diagnosis: Principal Problem:   MDD (major depressive disorder), recurrent severe, without psychosis (HCC) Active Problems:   Type 2 diabetes mellitus (HCC)   Eczema   Tobacco use disorder   Generalized anxiety disorder   Alcohol use disorder   Cocaine use disorder (HCC)  HPI: Jeffery Wheeler is a 56 yo [place] American male with a prior mental health history of polysubstance abuse (ETOH, cocaine), and history of incarceration x 30 yrs (total) for bank robbery & other charges who was transported to the [facility] with complaints of worsening depressive symptoms and +SI with a plan to overdose on drugs in the context of substance abuse and financial difficulties. Pt was involuntarily transferred to this [facility] for treatment and stabilization of his mood.   24 hr chart review: ` Patient's chart reviewed, his case discussed with his treatment team.  Patient's BP continues elevated earlier today morning at 164/99, but normalized at 129/86 on repeat. Pt remained asymptomatic. As per nursing flowsheets, he slept for a total of 5.5 hrs last night, and continues to be compliant with scheduled medications. Only PRN medication in the past 24 hours is Tylenol. Pt has denied SI, denies HI, denied AVH in the past 24 hrs, but has not been attending unit group sessions.  Today's patient assessment note: Today, pt verbalizes feeling sad, states that he feels like a failure in life,but states that his mood has improved since hospitalization. He denies suicidal ideations, denies homicidal ideations, and denies auditory or visual hallucinations.  He denies feelings of paranoia and there is no evidence of any delusional thinking.  He presents with a flat affect and depressed mood. Attention to personal hygiene and grooming is poor  and the need to tend to personal hygiene and grooming reiterated. Eye contact is good, speech is clear & coherent. Thought contents are organized and logical.  Patient reports that sleep quality last night was poor, and asking for his sleep aids to be increased.  He denies being in any physical distress or pain today, states that the Lidoderm patch is helpful, and reports a good appetite.  He denies any issues with his bowels.  We are increasing trazodone to 100 mg nightly to help with insomnia, and we will continue other medications as listed below.  Patient denies any medication related side effects, denies any issues with his bowel movements. No TD/EPS type symptoms found on assessment, and pt denies any feelings of stiffness. AIMS: 0.  He is tolerating the Abilify at 10 mg.  Writer educated patient on the fact that as per her CSW, [facility] only has the [employer] which is not guaranteed and mentioned.  Patient also educated that a referral has been placed to [facility] treatment center and we are currently awaiting their response.  Patient verbalizes understanding, and states that he would like to go to [facility] treatment center if accepted there.  Will notify CSW.  Patient educated on the need to come out of his room during group session and attend groups.  Total Time spent with patient: 30 minutes  Past Psychiatric History: MDD, polysubstance abuse  Past Medical History:  Past Medical History:  Diagnosis Date   Eczema    HLD (hyperlipidemia)    HTN (hypertension)    Type 2 diabetes mellitus (HCC)     Past Surgical  History:  Procedure Laterality Date   CERVICAL SPINE SURGERY  2018   right shoulder  1991   Family History:  Family History  Problem Relation Age of Onset   Diabetes type II Sister    Diabetes type II Paternal Grandmother    Diabetes type II Maternal Aunt    Family Psychiatric  History: non reported Social History:  Social History   Substance and  Sexual Activity  Alcohol Use Yes   Alcohol/week: 200.0 standard drinks of alcohol   Types: 80 Cans of beer, 120 Shots of liquor per week   Comment: reports periodically drinking as much as 4x 40 oz malt liquer and 1x fifth of tequila daily as fund permit     Social History   Substance and Sexual Activity  Drug Use Yes   Types: "Crack" cocaine, Methamphetamines, Marijuana, Fentanyl   Comment: Consistantly using crack cocaine almost on a daily basis, cannabis occasionally, other drugs as presented to him by other drug users    Social History   Socioeconomic History   Marital status: Single    Spouse name: Not on file   Number of children: Not on file   Years of education: Not on file   Highest education level: Not on file  Occupational History   Not on file  Tobacco Use   Smoking status: Never   Smokeless tobacco: Never  Substance and Sexual Activity   Alcohol use: Yes    Alcohol/week: 200.0 standard drinks of alcohol    Types: 80 Cans of beer, 120 Shots of liquor per week    Comment: reports periodically drinking as much as 4x 40 oz malt liquer and 1x fifth of tequila daily as fund permit   Drug use: Yes    Types: "Crack" cocaine, Methamphetamines, Marijuana, Fentanyl    Comment: Consistantly using crack cocaine almost on a daily basis, cannabis occasionally, other drugs as presented to him by other drug users   Sexual activity: Yes    Partners: Female  Other Topics Concern   Not on file  Social History Narrative   ** Merged History Encounter **       Social Determinants of Health   Financial Resource Strain: Not on file  Food Insecurity: Food Insecurity Present (02/21/2022)   Hunger Vital Sign    Worried About Running Out of Food in the Last Year: Often true    Ran Out of Food in the Last Year: Sometimes true  Transportation Needs: Unmet Transportation Needs (02/21/2022)   PRAPARE - Hydrologist (Medical): Yes    Lack of Transportation  (Non-Medical): Yes  Physical Activity: Not on file  Stress: Not on file  Social Connections: Not on file   Additional Social History:   Sleep: Good  Appetite:  Good  Current Medications: Current Facility-Administered Medications  Medication Dose Route Frequency Provider Last Rate Last Admin   acetaminophen (TYLENOL) tablet 650 mg  650 mg Oral Q6H PRN Lindon Romp A, NP   650 mg at 02/25/22 0807   alum & mag hydroxide-simeth (MAALOX/MYLANTA) 200-200-20 MG/5ML suspension 30 mL  30 mL Oral Q4H PRN Lindon Romp A, NP   30 mL at 02/23/22 0124   amLODipine (NORVASC) tablet 10 mg  10 mg Oral Daily Lindon Romp A, NP   10 mg at 02/26/22 0758   ARIPiprazole (ABILIFY) tablet 10 mg  10 mg Oral Daily Nicholes Rough, NP   10 mg at 02/26/22 0759   atorvastatin (LIPITOR) tablet 20  mg  20 mg Oral Daily Nira Conn A, NP   20 mg at 02/26/22 0759   cloNIDine (CATAPRES) tablet 0.1 mg  0.1 mg Oral BID PRN Starleen Blue, NP       gabapentin (NEURONTIN) capsule 300 mg  300 mg Oral TID Starleen Blue, NP   300 mg at 02/26/22 0759   hydrOXYzine (ATARAX) tablet 25 mg  25 mg Oral TID PRN Jackelyn Poling, NP   25 mg at 02/23/22 2112   lidocaine (LIDODERM) 5 % 1 patch  1 patch Transdermal Q24H Starleen Blue, NP   1 patch at 02/25/22 1300   magnesium hydroxide (MILK OF MAGNESIA) suspension 30 mL  30 mL Oral Daily PRN Jackelyn Poling, NP       metFORMIN (GLUCOPHAGE) tablet 850 mg  850 mg Oral BID WC Nira Conn A, NP   850 mg at 02/26/22 0759   multivitamin with minerals tablet 1 tablet  1 tablet Oral Daily Nira Conn A, NP   1 tablet at 02/26/22 7782   neomycin-bacitracin-polymyxin (NEOSPORIN) ointment   Topical BID Starleen Blue, NP   Given at 02/25/22 1700   sertraline (ZOLOFT) tablet 100 mg  100 mg Oral Daily Starleen Blue, NP   100 mg at 02/26/22 0758   thiamine (Vitamin B-1) tablet 100 mg  100 mg Oral Daily Nira Conn A, NP   100 mg at 02/26/22 0758   traZODone (DESYREL) tablet 100 mg  100 mg Oral QHS  Augusta Mirkin, Tyler Aas, NP       triamcinolone ointment (KENALOG) 0.1 %   Topical BID Starleen Blue, NP   Given at 02/25/22 0900   Vitamin D (Ergocalciferol) (DRISDOL) 1.25 MG (50000 UNIT) capsule 50,000 Units  50,000 Units Oral Q7 days Starleen Blue, NP   50,000 Units at 02/23/22 1350    Lab Results:  Results for orders placed or performed during the hospital encounter of 02/21/22 (from the past 48 hour(s))  Glucose, capillary     Status: Abnormal   Collection Time: 02/25/22  5:58 AM  Result Value Ref Range   Glucose-Capillary 105 (H) 70 - 99 mg/dL    Comment: Glucose reference range applies only to samples taken after fasting for at least 8 hours.   Comment 1 Notify RN    Comment 2 Document in Chart   Glucose, capillary     Status: Abnormal   Collection Time: 02/26/22  6:28 AM  Result Value Ref Range   Glucose-Capillary 134 (H) 70 - 99 mg/dL    Comment: Glucose reference range applies only to samples taken after fasting for at least 8 hours.   Comment 1 Notify RN    Comment 2 Document in Chart     Blood Alcohol level:  Lab Results  Component Value Date   ETH <10 02/21/2022   ETH <10 01/23/2022    Metabolic Disorder Labs: Lab Results  Component Value Date   HGBA1C 7.4 (H) 02/23/2022   MPG 165.68 02/23/2022   No results found for: "PROLACTIN" Lab Results  Component Value Date   CHOL 140 02/23/2022   TRIG 64 02/23/2022   HDL 42 02/23/2022   CHOLHDL 3.3 02/23/2022   VLDL 13 02/23/2022   LDLCALC 85 02/23/2022   LDLCALC 76 02/22/2022    Physical Findings: AIMS: 0 CIWA:  CIWA-Ar Total: 8 COWS: n/a    Musculoskeletal: Strength & Muscle Tone: within normal limits Gait & Station: normal Patient leans: N/A  Psychiatric Specialty Exam:  Presentation  General Appearance:  Disheveled  Eye Contact: Good  Speech: Clear and Coherent  Speech Volume: Normal  Handedness: Right   Mood and Affect  Mood: Depressed  Affect: Congruent   Thought Process   Thought Processes: Coherent  Descriptions of Associations:Intact  Orientation:Full (Time, Place and Person)  Thought Content:Logical  History of Schizophrenia/Schizoaffective disorder:No  Duration of Psychotic Symptoms:No data recorded Hallucinations:Hallucinations: None  Ideas of Reference:None  Suicidal Thoughts:Suicidal Thoughts: No SI Active Intent and/or Plan: Without Plan; Without Intent  Homicidal Thoughts:Homicidal Thoughts: No   Sensorium  Memory: Immediate Good  Judgment: Fair  Insight: Fair   Art therapist  Concentration: Good  Attention Span: Good  Recall: Good  Fund of Knowledge: Good  Language: Good  Psychomotor Activity  Psychomotor Activity: Psychomotor Activity: Normal   Assets  Assets: Communication Skills  Sleep  Sleep: Sleep: Poor  Physical Exam: Physical Exam Constitutional:      Appearance: Normal appearance.  HENT:     Head: Normocephalic.     Nose: Nose normal. No congestion or rhinorrhea.  Eyes:     Pupils: Pupils are equal, round, and reactive to light.  Pulmonary:     Effort: Pulmonary effort is normal.  Musculoskeletal:        General: Normal range of motion.     Cervical back: Normal range of motion.  Neurological:     Mental Status: He is alert and oriented to person, place, and time.    Review of Systems  Constitutional:  Negative for fever.  HENT:  Negative for hearing loss and sore throat.   Respiratory:  Negative for cough.   Cardiovascular:  Negative for chest pain.  Musculoskeletal:  Negative for myalgias.  Neurological: Negative.   Psychiatric/Behavioral:  Positive for depression and substance abuse. Negative for hallucinations, memory loss and suicidal ideas. The patient is nervous/anxious and has insomnia (resolving).    Blood pressure 129/86, pulse 83, temperature 98.5 F (36.9 C), temperature source Oral, resp. rate 17, height 5\' 8"  (1.727 m), weight 83.9 kg, SpO2 100 %. Body  mass index is 28.12 kg/m.  Treatment Plan Summary: Daily contact with patient to assess and evaluate symptoms and progress in treatment and Medication management   Observation Level/Precautions:  15 minute checks  Laboratory:  Labs reviewed on 02/23/2022-Orders placed for HA1C is 7.4, pt is diabetic and on Metformin 850 mg BID, will need PCP or endocrinology f/u after discharge. TSH WNL, Lipid panel mostly WNL with the exception of an LDL-130.  Vitamin D low at 20.2, will replace with vitamin D 50,000 units weekly. and B12 levels WNL. EKG reviewed, QTC-448. Baseline UA with trace leukocytes & glucose, but pt is diabetic.   Psychotherapy:  Unit Group sessions  Medications:  See [facility]  Consultations:  To be determined   Discharge Concerns:  Safety, medication compliance, mood stability  Estimated LOS: 5-7 days  Other:  N/A    PLAN Safety and Monitoring: Involuntary admission to inpatient psychiatric unit for safety, stabilization and treatment Daily contact with patient to assess and evaluate symptoms and progress in treatment Patient's case to be discussed in multi-disciplinary team meeting Observation Level : q15 minute checks Vital signs: q12 hours Precautions:Safety   Long Term Goal(s): Improvement in symptoms so as ready for discharge   Short Term Goals: Ability to identify changes in lifestyle to reduce recurrence of condition will improve, Ability to verbalize feelings will improve, Ability to disclose and discuss suicidal ideas, Ability to identify and develop effective coping behaviors will improve, Ability to maintain  clinical measurements within normal limits will improve, Compliance with prescribed medications will improve, and Ability to identify triggers associated with substance abuse/mental health issues will improve   Diagnoses:  Principal Problem:   MDD (major depressive disorder), recurrent severe, without psychosis (HCC) Active Problems:   Type 2 diabetes mellitus  (HCC)   Eczema   Tobacco use disorder   Generalized anxiety disorder   Alcohol use disorder   Cocaine use disorder (HCC)   Severe recurrent major depressive disorder without psychotic features (HCC) -Continue Abilify 10 mg daily for treatment of paranoia and racing thoughts & as an adjunct to management of depressive symptoms. (Pt educated on benefits, rationales and possible side effects of medication including metabolic syndrome and prolonged QTC. Lipid panel WNL, TSH WNL, hemoglobin A1C-7.1, has hx of DM, with poor med compliance. Educated on the need to f/u with PCP and endocrinology after discharge). -Continue Zoloft 100 mg starting for depressive symptoms   Anxiety -Continue Hydroxyzine 25 mg every 6 hours PRN -Continue Gabapentin to 300 mg TID for pain, anxiety and alcohol use d/o   Insomnia -Increase Trazodone to 100 mg nightly   ETOH use disorder Monitor for signs of withdrawal -Continue Ativan Detox protocol for alcohol use-See MAR  - Oral thiamine and MVI replacement - Abstinence from substances encouraged  - SW to look into options for outpatient SA treatment at discharge  -Continue Ativan 1mg  tabs every 6 hours PRNCIWA > 10   Other medications for medical reasons -Given Clonidine 0.1 mg x 1 dose for elevated BP on 10/23 -Continue Clonidine 0.1 mg for SBP>160 or DBP >100 -Continue Vitamin D 50,000 units for vitamin D deficiency -Continue Norvasc 10 mg for htn -Continue Metformin 850 mg BID for DM   Other PRNS -Continue Tylenol 650 mg every 6 hours PRN for mild pain -Continue Maalox 30 mg every 4 hrs PRN for indigestion -Continue Imodium 2-4 mg as needed for diarrhea -Continue Milk of Magnesia as needed every 6 hrs for constipation -Continue Zofran disintegrating tabs every 6 hrs PRN for nausea    Discharge Planning: Social work and case management to assist with discharge planning and identification of hospital follow-up needs prior to discharge Estimated LOS:  5-7 days Discharge Concerns: Need to establish a safety plan; Medication compliance and effectiveness Discharge Goals: Return home with outpatient referrals for mental health follow-up including medication management/psychotherapy   I certify that inpatient services furnished can reasonably be expected to improve the patient's condition.   Starleen Blueoris  Tsering Leaman, NP 02/26/2022, 12:50 PMPatient ID: Jeffery Wheeler, male   DOB: 08/31/1965, 56 y.o.   MRN: [id number] Patient ID: Jeffery Wheeler, male   DOB: 10/28/1965, 56 y.o.   MRN: [id number] Patient ID: Jeffery Wheeler, male   DOB: 09/30/1965, 56 y.o.   MRN: [id number]

## 2022-02-26 NOTE — BHH Counselor (Signed)
CSW called the [facility] and spoke with Insurance risk surveyor (Admissions), and reports that the pt's insurance and presenting symptoms and medications are "appropriate and is awaiting the pt's call to admissions for his intake screening. CSW informed pt and provided contact information, pt is encouraged to call during morning hours as advised by intake due to staff shortages. CSW will continue to monitor and provide support as needed.

## 2022-02-26 NOTE — Progress Notes (Signed)
Adult Psychoeducational Group Note  Date:  02/26/2022 Time:  9:08 PM  Group Topic/Focus:  Wrap-Up Group:   The focus of this group is to help patients review their daily goal of treatment and discuss progress on daily workbooks.  Participation Level:  Active  Participation Quality:  Appropriate  Affect:  Appropriate  Cognitive:  Appropriate  Insight: Appropriate  Engagement in Group:  Engaged  Modes of Intervention:  Discussion and Support  Additional Comments:   Pt attended and actively participated in the [place] group. Pt denies SI/HI/AVH. Pt rated his day 5/10. Pt reports no current goals. Pt identified sleeping as his main coping skills. Pt accepted the challenge of practicing deep breathing 2x daily to improve coping.   Jeffery Wheeler Wubben 02/26/2022, 9:08 PM

## 2022-02-26 NOTE — Progress Notes (Signed)
   02/26/22 0800  Psych Admission Type (Psych Patients Only)  Admission Status Involuntary  Psychosocial Assessment  Patient Complaints Anxiety;Insomnia;Depression  Eye Contact Fair  Facial Expression Anxious  Affect Anxious;Irritable  Speech Logical/coherent  Interaction Assertive;Guarded;Forwards little  Motor Activity Slow  Appearance/Hygiene Disheveled  Behavior Characteristics Cooperative;Anxious;Irritable  Mood Depressed;Anxious;Irritable  Thought Process  Coherency WDL  Content Blaming self  Delusions None reported or observed  Perception WDL  Hallucination None reported or observed  Judgment WDL  Confusion None  Danger to Self  Current suicidal ideation? Denies  Self-Injurious Behavior No self-injurious ideation or behavior indicators observed or expressed   Agreement Not to Harm Self Yes  Description of Agreement verbal  Danger to Others  Danger to Others None reported or observed

## 2022-02-27 ENCOUNTER — Encounter (HOSPITAL_COMMUNITY): Payer: Self-pay

## 2022-02-27 DIAGNOSIS — F332 Major depressive disorder, recurrent severe without psychotic features: Secondary | ICD-10-CM | POA: Diagnosis not present

## 2022-02-27 LAB — GLUCOSE, CAPILLARY: Glucose-Capillary: 121 mg/dL — ABNORMAL HIGH (ref 70–99)

## 2022-02-27 MED ORDER — SERTRALINE HCL 100 MG PO TABS: 100.0000 mg | ORAL_TABLET | Freq: Every day | ORAL | 0 refills | Status: DC

## 2022-02-27 MED ORDER — ATORVASTATIN CALCIUM 20 MG PO TABS: 20.0000 mg | ORAL_TABLET | Freq: Every day | ORAL | 0 refills | Status: DC

## 2022-02-27 MED ORDER — ARIPIPRAZOLE 10 MG PO TABS: 10.0000 mg | ORAL_TABLET | Freq: Every day | ORAL | 0 refills | Status: DC

## 2022-02-27 MED ORDER — TRAZODONE HCL 100 MG PO TABS: 100.0000 mg | ORAL_TABLET | Freq: Every day | ORAL | 0 refills | Status: DC

## 2022-02-27 MED ORDER — AMLODIPINE BESYLATE 10 MG PO TABS: 10.0000 mg | ORAL_TABLET | Freq: Every day | ORAL | 0 refills | Status: DC

## 2022-02-27 MED ORDER — GABAPENTIN 300 MG PO CAPS: 300.0000 mg | ORAL_CAPSULE | Freq: Three times a day (TID) | ORAL | 0 refills | Status: DC

## 2022-02-27 MED ORDER — TRIAMCINOLONE ACETONIDE 0.1 % EX OINT: TOPICAL_OINTMENT | Freq: Two times a day (BID) | CUTANEOUS | 0 refills | Status: DC

## 2022-02-27 MED ORDER — METFORMIN HCL 850 MG PO TABS: 850.0000 mg | ORAL_TABLET | Freq: Two times a day (BID) | ORAL | 0 refills | Status: DC

## 2022-02-27 NOTE — Progress Notes (Signed)
The patient attended the evening group and was appropriate.  °
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
## 2022-02-27 NOTE — Progress Notes (Signed)
   02/27/22 2300  Psych Admission Type (Psych Patients Only)  Admission Status Involuntary  Psychosocial Assessment  Patient Complaints Irritability;Sleep disturbance  Eye Contact Fair  Facial Expression Flat  Affect Irritable  Speech Soft;Slow  Interaction Assertive  Motor Activity Slow  Appearance/Hygiene Disheveled  Behavior Characteristics Cooperative;Irritable  Mood Depressed;Irritable  Thought Process  Coherency WDL  Content WDL  Delusions None reported or observed  Perception WDL  Hallucination None reported or observed  Judgment Poor  Confusion WDL  Danger to Self  Current suicidal ideation? Denies  Self-Injurious Behavior No self-injurious ideation or behavior indicators observed or expressed   Agreement Not to Harm Self Yes  Description of Agreement verbal  Danger to Others  Danger to Others None reported or observed
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
## 2022-02-27 NOTE — Progress Notes (Signed)
   02/27/22 0012  Psych Admission Type (Psych Patients Only)  Admission Status Involuntary  Psychosocial Assessment  Patient Complaints Sleep disturbance  Eye Contact Fair  Facial Expression Flat;Anxious  Affect Anxious  Speech Logical/coherent  Interaction Assertive  Motor Activity Slow  Appearance/Hygiene Disheveled  Behavior Characteristics Cooperative;Anxious  Mood Depressed;Anxious  Thought Process  Coherency WDL  Content Blaming self  Delusions WDL  Perception WDL  Hallucination None reported or observed  Judgment Limited  Confusion WDL  Danger to Self  Current suicidal ideation? Denies  Danger to Others  Danger to Others None reported or observed

## 2022-02-27 NOTE — BHH Group Notes (Signed)
Pt did not attend MHT group 

## 2022-02-27 NOTE — Group Note (Signed)
LCSW Group Therapy Note  Group Date: 02/27/2022 Start Time: 1300 End Time: 1400   Type of Therapy and Topic:  Group Therapy: Using "I" Statements  Participation Level:  Minimal  Description of Group:  Patients were asked to provide details of some interpersonal conflicts they have experienced. Patients were then educated about "I" statements, communication which focuses on feelings or views of the speaker rather than what the other person is doing. T group members were asked to reflect on past conflicts and to provide specific examples for utilizing "I" statements.  Therapeutic Goals:  Patients will verbalize understanding of ineffective communication and effective communication. Patients will be able to empathize with whom they are having conflict. Patients will practice effective communication in the form of "I" statements.    Summary of Patient Progress:  Arvie shared minimal input. The patient was not engaged throughout the session and proved open to feedback from [place] and peers. Patient demonstrated no insight into the subject matter.  Therapeutic Modalities:   Cognitive Behavioral Therapy Solution-Focused Therapy    Windle Guard, LCSW 02/27/2022  3:48 PM

## 2022-02-27 NOTE — Progress Notes (Signed)
   02/27/22 0500  Sleep  Number of Hours 6.5    

## 2022-02-27 NOTE — Progress Notes (Signed)
Patient did not attend morning orientation group.  

## 2022-02-27 NOTE — Progress Notes (Signed)
Patient ID: Jeffery Wheeler, male   DOB: 1965/08/24, 55 y.o.   MRN: [id number] Pt presents with irritable mood, affect congruent. [name] reports he is doing '' okay '' but is not forthcoming of thoughts and does not elaborate much when asked. He does state '' the medication is helping. I am getting better''  He does complain that he was not able to sleep due to another peer on the unit last night waking him up. He states '' she was making too much noise so it woke me up. '' Pt offered ear plugs and encouraged to notify staff so that he can get rest. Pt denies any SI or HI . Pt denies any AH or VH either. Pt has been compliant with medications. Eating and drinking well and able to make his needs known but remains isolative to room thus far.  Did not complete self inventory. Pt is safe, will con't to monitor.

## 2022-02-27 NOTE — Progress Notes (Signed)
[facility] MD Progress Note  02/27/2022 12:18 PM Jeffery Wheeler  MRN:  [id number] Principal Problem: MDD (major depressive disorder), recurrent severe, without psychosis (HCC) Diagnosis: Principal Problem:   MDD (major depressive disorder), recurrent severe, without psychosis (HCC) Active Problems:   Type 2 diabetes mellitus (HCC)   Eczema   Tobacco use disorder   Generalized anxiety disorder   Alcohol use disorder   Cocaine use disorder (HCC)  HPI: Jeffery Wheeler is a 56 yo [place] American male with a prior mental health history of polysubstance abuse (ETOH, cocaine), and history of incarceration x 30 yrs (total) for bank robbery & other charges who was transported to the [facility] with complaints of worsening depressive symptoms and +SI with a plan to overdose on drugs in the context of substance abuse and financial difficulties. Pt was involuntarily transferred to this [facility] for treatment and stabilization of his mood.   24 hr chart review: ` Patient's chart reviewed, his case discussed with his treatment team.  Patient's SBP has been between 130s and 140s in the past 24 hrs. Pt slept for a  total of 6.5 hrs last night as per nursing flow sheets. He is taking all meds as scheduled and continues to deny being in any physical distress. PRN medications in the past 24 hrs have been Tylenol 650 mg.  Today's patient assessment note: Today, pt denies suicidal ideations. He denies homicidal ideations and denies AVH. He denies paranoia and there is no evidence of delusional thinking. Pt continues to present with a flat affect and depressed mood. His attention to personal hygiene and grooming is fair, eye contact is good, speech is clear & coherent. Thought contents are organized and logical.   Patient reports that sleep quality last night was poor, and states that this was due to a patient in the milieu disrupting the milieu. Pt reports a good appetite, and denies being in any physical  pain/distress. He denies any other concerns, states he is tolerating medications well with no medication related side effects. He verbalizes readiness for discharge to [facility] treatment center for substance abuse rehab. No TD/EPS type symptoms found on assessment, and pt denies any feelings of stiffness. AIMS: 0.  He is tolerating the Abilify at 10 mg.  All medications are being continued as listed below.  Total Time spent with patient: 30 minutes  Past Psychiatric History: MDD, polysubstance abuse  Past Medical History:  Past Medical History:  Diagnosis Date   Eczema    HLD (hyperlipidemia)    HTN (hypertension)    Type 2 diabetes mellitus (HCC)     Past Surgical History:  Procedure Laterality Date   CERVICAL SPINE SURGERY  2018   right shoulder  1991   Family History:  Family History  Problem Relation Age of Onset   Diabetes type II Sister    Diabetes type II Paternal Grandmother    Diabetes type II Maternal Aunt    Family Psychiatric  History: non reported Social History:  Social History   Substance and Sexual Activity  Alcohol Use Yes   Alcohol/week: 200.0 standard drinks of alcohol   Types: 80 Cans of beer, 120 Shots of liquor per week   Comment: reports periodically drinking as much as 4x 40 oz malt liquer and 1x fifth of tequila daily as fund permit     Social History   Substance and Sexual Activity  Drug Use Yes   Types: "Crack" cocaine, Methamphetamines, Marijuana, Fentanyl  Comment: Consistantly using crack cocaine almost on a daily basis, cannabis occasionally, other drugs as presented to him by other drug users    Social History   Socioeconomic History   Marital status: Single    Spouse name: Not on file   Number of children: Not on file   Years of education: Not on file   Highest education level: Not on file  Occupational History   Not on file  Tobacco Use   Smoking status: Never   Smokeless tobacco: Never  Substance and Sexual Activity    Alcohol use: Yes    Alcohol/week: 200.0 standard drinks of alcohol    Types: 80 Cans of beer, 120 Shots of liquor per week    Comment: reports periodically drinking as much as 4x 40 oz malt liquer and 1x fifth of tequila daily as fund permit   Drug use: Yes    Types: "Crack" cocaine, Methamphetamines, Marijuana, Fentanyl    Comment: Consistantly using crack cocaine almost on a daily basis, cannabis occasionally, other drugs as presented to him by other drug users   Sexual activity: Yes    Partners: Female  Other Topics Concern   Not on file  Social History Narrative   ** Merged History Encounter **       Social Determinants of Health   Financial Resource Strain: Not on file  Food Insecurity: Food Insecurity Present (02/21/2022)   Hunger Vital Sign    Worried About Running Out of Food in the Last Year: Often true    Ran Out of Food in the Last Year: Sometimes true  Transportation Needs: Unmet Transportation Needs (02/21/2022)   PRAPARE - Administrator, Civil ServiceTransportation    Lack of Transportation (Medical): Yes    Lack of Transportation (Non-Medical): Yes  Physical Activity: Not on file  Stress: Not on file  Social Connections: Not on file   Additional Social History:   Sleep: Good  Appetite:  Good  Current Medications: Current Facility-Administered Medications  Medication Dose Route Frequency Provider Last Rate Last Admin   acetaminophen (TYLENOL) tablet 650 mg  650 mg Oral Q6H PRN Nira ConnBerry, Jason A, NP   650 mg at 02/25/22 0807   alum & mag hydroxide-simeth (MAALOX/MYLANTA) 200-200-20 MG/5ML suspension 30 mL  30 mL Oral Q4H PRN Nira ConnBerry, Jason A, NP   30 mL at 02/23/22 0124   amLODipine (NORVASC) tablet 10 mg  10 mg Oral Daily Nira ConnBerry, Jason A, NP   10 mg at 02/27/22 0811   ARIPiprazole (ABILIFY) tablet 10 mg  10 mg Oral Daily Starleen BlueNkwenti, Amely Voorheis, NP   10 mg at 02/27/22 0811   atorvastatin (LIPITOR) tablet 20 mg  20 mg Oral Daily Nira ConnBerry, Jason A, NP   20 mg at 02/27/22 [id number]   cloNIDine (CATAPRES) tablet  0.1 mg  0.1 mg Oral BID PRN Starleen BlueNkwenti, Keion Neels, NP       gabapentin (NEURONTIN) capsule 300 mg  300 mg Oral TID Starleen BlueNkwenti, Deatrice Spanbauer, NP   300 mg at 02/27/22 [id number]   hydrOXYzine (ATARAX) tablet 25 mg  25 mg Oral TID PRN Nira ConnBerry, Jason A, NP   25 mg at 02/23/22 2112   lidocaine (LIDODERM) 5 % 1 patch  1 patch Transdermal Q24H Starleen BlueNkwenti, Belkys Henault, NP   1 patch at 02/26/22 1344   magnesium hydroxide (MILK OF MAGNESIA) suspension 30 mL  30 mL Oral Daily PRN Nira ConnBerry, Jason A, NP       metFORMIN (GLUCOPHAGE) tablet 850 mg  850 mg Oral BID WC Jackelyn PolingBerry, Jason A, NP  850 mg at 02/27/22 6948   multivitamin with minerals tablet 1 tablet  1 tablet Oral Daily Lindon Romp A, NP   1 tablet at 02/27/22 5462   neomycin-bacitracin-polymyxin (NEOSPORIN) ointment   Topical BID Nicholes Rough, NP   Given at 02/26/22 1715   sertraline (ZOLOFT) tablet 100 mg  100 mg Oral Daily Nicholes Rough, NP   100 mg at 02/27/22 7035   thiamine (Vitamin B-1) tablet 100 mg  100 mg Oral Daily Lindon Romp A, NP   100 mg at 02/27/22 0093   traZODone (DESYREL) tablet 100 mg  100 mg Oral QHS Nicholes Rough, NP   100 mg at 02/26/22 2154   triamcinolone ointment (KENALOG) 0.1 %   Topical BID Nicholes Rough, NP   Given at 02/25/22 0900   Vitamin D (Ergocalciferol) (DRISDOL) 1.25 MG (50000 UNIT) capsule 50,000 Units  50,000 Units Oral Q7 days Nicholes Rough, NP   50,000 Units at 02/23/22 1350    Lab Results:  Results for orders placed or performed during the hospital encounter of 02/21/22 (from the past 48 hour(s))  Glucose, capillary     Status: Abnormal   Collection Time: 02/26/22  6:28 AM  Result Value Ref Range   Glucose-Capillary 134 (H) 70 - 99 mg/dL    Comment: Glucose reference range applies only to samples taken after fasting for at least 8 hours.   Comment 1 Notify RN    Comment 2 Document in Chart    Blood Alcohol level:  Lab Results  Component Value Date   ETH <10 02/21/2022   ETH <10 81/82/9937   Metabolic Disorder Labs: Lab Results   Component Value Date   HGBA1C 7.4 (H) 02/23/2022   MPG 165.68 02/23/2022   No results found for: "PROLACTIN" Lab Results  Component Value Date   CHOL 140 02/23/2022   TRIG 64 02/23/2022   HDL 42 02/23/2022   CHOLHDL 3.3 02/23/2022   VLDL 13 02/23/2022   LDLCALC 85 02/23/2022   LDLCALC 76 02/22/2022   Physical Findings: AIMS: 0 CIWA:  CIWA-Ar Total: 8 COWS: n/a    Musculoskeletal: Strength & Muscle Tone: within normal limits Gait & Station: normal Patient leans: N/A  Psychiatric Specialty Exam:  Presentation  General Appearance:  Appropriate for Environment; Fairly Groomed  Eye Contact: Good  Speech: Clear and Coherent  Speech Volume: Normal  Handedness: Right   Mood and Affect  Mood: Depressed  Affect: Congruent   Thought Process  Thought Processes: Coherent  Descriptions of Associations:Intact  Orientation:Full (Time, Place and Person)  Thought Content:Logical  History of Schizophrenia/Schizoaffective disorder:No  Duration of Psychotic Symptoms:No data recorded Hallucinations:Hallucinations: None  Ideas of Reference:None  Suicidal Thoughts:Suicidal Thoughts: No  Homicidal Thoughts:Homicidal Thoughts: No   Sensorium  Memory: Immediate Good  Judgment: Fair  Insight: Fair  Community education officer  Concentration: Good  Attention Span: Good  Recall: Good  Fund of Knowledge: Good  Language: Good  Psychomotor Activity  Psychomotor Activity: Psychomotor Activity: Normal   Assets  Assets: Communication Skills  Sleep  Sleep: Sleep: Poor  Physical Exam: Physical Exam Constitutional:      Appearance: Normal appearance.  HENT:     Head: Normocephalic.     Nose: Nose normal. No congestion or rhinorrhea.  Eyes:     Pupils: Pupils are equal, round, and reactive to light.  Pulmonary:     Effort: Pulmonary effort is normal.  Musculoskeletal:        General: Normal range of motion.  Cervical back: Normal  range of motion.  Neurological:     Mental Status: He is alert and oriented to person, place, and time.    Review of Systems  Constitutional:  Negative for fever.  HENT:  Negative for hearing loss and sore throat.   Respiratory:  Negative for cough.   Cardiovascular:  Negative for chest pain.  Musculoskeletal:  Negative for myalgias.  Neurological: Negative.   Psychiatric/Behavioral:  Positive for depression and substance abuse. Negative for hallucinations, memory loss and suicidal ideas. The patient is nervous/anxious and has insomnia (resolving).    Blood pressure (!) 137/95, pulse 80, temperature 98.1 F (36.7 C), temperature source Oral, resp. rate 17, height 5\' 8"  (1.727 m), weight 83.9 kg, SpO2 98 %. Body mass index is 28.12 kg/m.  Treatment Plan Summary: Daily contact with patient to assess and evaluate symptoms and progress in treatment and Medication management   Observation Level/Precautions:  15 minute checks  Laboratory:  Labs reviewed on 02/27/2022-HA1C is 7.4, pt is diabetic and on Metformin 850 mg BID, will need PCP or endocrinology f/u after discharge. TSH WNL, Lipid panel mostly WNL with the exception of an LDL-130.  Vitamin D low at 20.2, will replace with vitamin D 50,000 units weekly. and B12 levels WNL. EKG reviewed, QTC-448. Baseline UA with trace leukocytes & glucose, but pt is diabetic.   Psychotherapy:  Unit Group sessions  Medications:  See [facility]  Consultations:  To be determined   Discharge Concerns:  Safety, medication compliance, mood stability  Estimated LOS: 5-7 days  Other:  N/A    PLAN Safety and Monitoring: Involuntary admission to inpatient psychiatric unit for safety, stabilization and treatment Daily contact with patient to assess and evaluate symptoms and progress in treatment Patient's case to be discussed in multi-disciplinary team meeting Observation Level : q15 minute checks Vital signs: q12 hours Precautions:Safety   Long Term Goal(s):  Improvement in symptoms so as ready for discharge   Short Term Goals: Ability to identify changes in lifestyle to reduce recurrence of condition will improve, Ability to verbalize feelings will improve, Ability to disclose and discuss suicidal ideas, Ability to identify and develop effective coping behaviors will improve, Ability to maintain clinical measurements within normal limits will improve, Compliance with prescribed medications will improve, and Ability to identify triggers associated with substance abuse/mental health issues will improve   Diagnoses:  Principal Problem:   MDD (major depressive disorder), recurrent severe, without psychosis (HCC) Active Problems:   Type 2 diabetes mellitus (HCC)   Eczema   Tobacco use disorder   Generalized anxiety disorder   Alcohol use disorder   Cocaine use disorder (HCC)   Severe recurrent major depressive disorder without psychotic features (HCC) -Continue Abilify 10 mg daily for treatment of paranoia and racing thoughts & as an adjunct to management of depressive symptoms. (Pt educated on benefits, rationales and possible side effects of medication including metabolic syndrome and prolonged QTC. Lipid panel WNL, TSH WNL, hemoglobin A1C-7.1, has hx of DM, with poor med compliance. Educated on the need to f/u with PCP and endocrinology after discharge). -Continue Zoloft 100 mg starting for depressive symptoms   Anxiety -Continue Hydroxyzine 25 mg every 6 hours PRN -Continue Gabapentin to 300 mg TID for pain, anxiety and alcohol use d/o   Insomnia -Continue Trazodone to 100 mg nightly   ETOH use disorder Monitor for signs of withdrawal -Continue Ativan Detox protocol for alcohol use-See MAR  - Oral thiamine and MVI replacement - Abstinence from substances  encouraged  - SW to look into options for outpatient SA treatment at discharge  -Continue Ativan 1mg  tabs every 6 hours PRNCIWA > 10   Other medications for medical reasons -Given  Clonidine 0.1 mg x 1 dose for elevated BP on 10/23 -Continue Clonidine 0.1 mg for SBP>160 or DBP >100 -Continue Vitamin D 50,000 units for vitamin D deficiency -Continue Norvasc 10 mg for htn -Continue Metformin 850 mg BID for DM   Other PRNS -Continue Tylenol 650 mg every 6 hours PRN for mild pain -Continue Maalox 30 mg every 4 hrs PRN for indigestion -Continue Imodium 2-4 mg as needed for diarrhea -Continue Milk of Magnesia as needed every 6 hrs for constipation -Continue Zofran disintegrating tabs every 6 hrs PRN for nausea    Discharge Planning: Social work and case management to assist with discharge planning and identification of hospital follow-up needs prior to discharge Estimated LOS: 5-7 days Discharge Concerns: Need to establish a safety plan; Medication compliance and effectiveness Discharge Goals: Return home with outpatient referrals for mental health follow-up including medication management/psychotherapy   I certify that inpatient services furnished can reasonably be expected to improve the patient's condition.   11/23, NP 02/27/2022, 12:18 PMPatient ID: 03/01/2022, male   DOB: Oct 20, 1965, 56 y.o.   MRN: [id number] Patient ID: Jeffery Wheeler, male   DOB: 18-Jun-1965,
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
## 2022-02-27 NOTE — Plan of Care (Signed)
  Problem: Education: Goal: Knowledge of [facility] Education information/materials will improve Outcome: Progressing Goal: Emotional status will improve Outcome: Progressing Goal: Mental status will improve Outcome: Progressing Goal: Verbalization of understanding the information provided will improve Outcome: Progressing   Problem: Activity: Goal: Interest or engagement in activities will improve Outcome: Progressing Goal: Sleeping patterns will improve Outcome: Progressing   Problem: Coping: Goal: Ability to verbalize frustrations and anger appropriately will improve Outcome: Progressing Goal: Ability to demonstrate self-control will improve Outcome: Progressing   

## 2022-02-27 NOTE — Progress Notes (Signed)
  [facility] Adult Case Management Discharge Plan :  Will you be returning to the same living situation after discharge:  No. [facility] At discharge, do you have transportation home?: Yes,  Taxi, [employer] Do you have the ability to pay for your medications: Yes,  Insured  Release of information consent forms completed and in the chart;  Patient's signature needed at discharge.  Patient to Follow up at:  [facility].   Why: Referral sent 02/23/2022 Contact information: [place] [address] [phone]                 Next level of care provider has access to [place] and Suicide Prevention discussed: Loyce Dys, sister, [phone],  [facility]     Has patient been referred to the Quitline?: Patient refused referral  Patient has been referred for addiction treatment: Yes Patient to continue working towards treatment goals after discharge. Patient no longer meets criteria for inpatient criteria per attending physician. Continue taking medications as prescribed, nursing to provide instructions at discharge. Follow up with all scheduled appointments.    [place], LCSW 02/27/2022, 3:56 PM

## 2022-02-28 LAB — GLUCOSE, CAPILLARY: Glucose-Capillary: 116 mg/dL — ABNORMAL HIGH (ref 70–99)

## 2022-02-28 NOTE — Progress Notes (Signed)
Order received for patients discharge. AVS printed and reviewed with patient at length. Pt verbalized understanding of crisis services as well as follow up discharge plan. Pt provided a cab voucher as well as a greyhound bus. Pt denies any SI or HI or AV/ Hallucinations. Requested to shave prior to discharge. All belongings returned and pt escorted to the lobby to cab to go to bus stop. RX given and patient verbalized understanding, opportunity provided for questions. Pt shows no signs of acute decompensation.

## 2022-03-05 ENCOUNTER — Emergency Department (HOSPITAL_COMMUNITY)
Admission: EM | Admit: 2022-03-05 | Discharge: 2022-03-05 | Discharge status: Against Medical Advice | Payer: Commercial Managed Care - HMO | Attending: Emergency Medicine | Admitting: Emergency Medicine

## 2022-03-05 ENCOUNTER — Emergency Department (HOSPITAL_COMMUNITY): Payer: Commercial Managed Care - HMO

## 2022-03-05 ENCOUNTER — Other Ambulatory Visit: Payer: Self-pay

## 2022-03-05 DIAGNOSIS — Z5321 Procedure and treatment not carried out due to patient leaving prior to being seen by health care provider: Secondary | ICD-10-CM | POA: Insufficient documentation

## 2022-03-05 DIAGNOSIS — R079 Chest pain, unspecified: Secondary | ICD-10-CM | POA: Insufficient documentation

## 2022-03-05 LAB — CBC
HCT: 45.1 % (ref 39.0–52.0)
Hemoglobin: 14.6 g/dL (ref 13.0–17.0)
MCH: 27.8 pg (ref 26.0–34.0)
MCHC: 32.4 g/dL (ref 30.0–36.0)
MCV: 85.9 fL (ref 80.0–100.0)
Platelets: 423 10*3/uL — ABNORMAL HIGH (ref 150–400)
RBC: 5.25 MIL/uL (ref 4.22–5.81)
RDW: 17.6 % — ABNORMAL HIGH (ref 11.5–15.5)
WBC: 18.4 10*3/uL — ABNORMAL HIGH (ref 4.0–10.5)
nRBC: 0 % (ref 0.0–0.2)

## 2022-03-05 LAB — BASIC METABOLIC PANEL
Anion gap: 14 (ref 5–15)
BUN: 17 mg/dL (ref 6–20)
CO2: 21 mmol/L — ABNORMAL LOW (ref 22–32)
Calcium: 8.9 mg/dL (ref 8.9–10.3)
Chloride: 106 mmol/L (ref 98–111)
Creatinine, Ser: 1.04 mg/dL (ref 0.61–1.24)
GFR, Estimated: >60 mL/min (ref >60)
Glucose, Bld: 109 mg/dL — ABNORMAL HIGH (ref 70–99)
Potassium: 4.1 mmol/L (ref 3.5–5.1)
Sodium: 141 mmol/L (ref 135–145)

## 2022-03-05 LAB — TROPONIN I (HIGH SENSITIVITY): Troponin I (High Sensitivity): 6 ng/L (ref <18)

## 2022-03-05 NOTE — ED Provider Triage Note (Signed)
Emergency Medicine Provider Triage Evaluation Note  Jeffery Wheeler , a 56 y.o. male  was evaluated in triage.  Pt complains of chest pain starting this morning.  It is constant.  Patient is a poor historian and he does not want to answer questions.  He notably does not need dialysis and does not have an AV fistula.  Unsure what he was doing outside of the dialysis facility..  Review of Systems  Per HPI  Physical Exam  BP 131/80 (BP Location: Right Arm)   Pulse 75   Temp 97.7 F (36.5 C) (Oral)   Resp 16   SpO2 99%  Gen:   Awake, no distress   Resp:  Normal effort  MSK:   Moves extremities without difficulty  Other:    Medical Decision Making  Medically screening exam initiated at 7:49 AM.  Appropriate orders placed.  Jeffery Wheeler was informed that the remainder of the evaluation will be completed by another provider, this initial triage assessment does not replace that evaluation, and the importance of remaining in the ED until their evaluation is complete.     Sherrill Raring, PA-C 03/05/22 [phone]

## 2022-03-05 NOTE — ED Notes (Signed)
This patient did not answer x2

## 2022-03-05 NOTE — ED Triage Notes (Signed)
EMS stated, he was walking into the dialysis facility and was stating he had chest pain . Was sitting there waiting on EMS to pick him up. Denies any other symptoms.

## 2022-03-05 NOTE — ED Notes (Signed)
No answer x1

## 2022-03-05 NOTE — ED Notes (Signed)
Pt walked out of triage

## 2022-04-03 ENCOUNTER — Other Ambulatory Visit (HOSPITAL_COMMUNITY): Payer: Self-pay

## 2022-04-03 ENCOUNTER — Other Ambulatory Visit: Payer: Self-pay | Admitting: Physician Assistant

## 2022-04-03 DIAGNOSIS — E785 Hyperlipidemia, unspecified: Secondary | ICD-10-CM

## 2022-04-03 DIAGNOSIS — L2082 Flexural eczema: Secondary | ICD-10-CM

## 2022-04-03 DIAGNOSIS — E119 Type 2 diabetes mellitus without complications: Secondary | ICD-10-CM

## 2022-04-03 DIAGNOSIS — F3341 Major depressive disorder, recurrent, in partial remission: Secondary | ICD-10-CM

## 2022-04-03 DIAGNOSIS — M25512 Pain in left shoulder: Secondary | ICD-10-CM

## 2022-04-03 DIAGNOSIS — I10 Essential (primary) hypertension: Secondary | ICD-10-CM

## 2022-04-21 ENCOUNTER — Emergency Department (HOSPITAL_COMMUNITY)
Admission: EM | Admit: 2022-04-21 | Discharge: 2022-04-21 | Disposition: A | Discharge status: Home / Self Care | Payer: Commercial Managed Care - HMO | Attending: Emergency Medicine | Admitting: Emergency Medicine

## 2022-04-21 ENCOUNTER — Other Ambulatory Visit: Payer: Self-pay

## 2022-04-21 ENCOUNTER — Encounter (HOSPITAL_COMMUNITY): Payer: Self-pay

## 2022-04-21 ENCOUNTER — Emergency Department (HOSPITAL_COMMUNITY): Payer: Commercial Managed Care - HMO

## 2022-04-21 DIAGNOSIS — X500XXA Overexertion from strenuous movement or load, initial encounter: Secondary | ICD-10-CM | POA: Diagnosis not present

## 2022-04-21 DIAGNOSIS — M25511 Pain in right shoulder: Secondary | ICD-10-CM | POA: Insufficient documentation

## 2022-04-21 MED ORDER — OXYCODONE-ACETAMINOPHEN 5-325 MG PO TABS
1.0000 | ORAL_TABLET | Freq: Once | ORAL | Status: AC
  Administered 2022-04-21: 1 via ORAL
  Filled 2022-04-21: qty 1

## 2022-04-21 MED ORDER — OXYCODONE-ACETAMINOPHEN 5-325 MG PO TABS: 2.0000 | ORAL_TABLET | Freq: Once | ORAL | Status: DC

## 2022-04-21 NOTE — ED Provider Notes (Signed)
[facility] EMERGENCY DEPARTMENT Provider Note   CSN: [id number] Arrival date & time: 04/21/22  1629     History  Chief Complaint  Patient presents with   Shoulder Pain    Dequavious Harshberger is a 56 y.o. male.  56 year old male presents complaining of right shoulder pain which occurred while he was trying to move an object yesterday.  States he felt his shoulder dislocate and relocate.  History of dislocation before in the past.  Denies numbness or tingling to his hand.  Pain is characterized as sharp and worse with movement.  Unrelieved with home medications.       Home Medications Prior to Admission medications   Medication Sig Start Date End Date Taking? Authorizing Provider  amLODipine (NORVASC) 10 MG tablet Take 1 tablet (10 mg total) by mouth daily. 02/27/22   Sarita Bottom, MD  ARIPiprazole (ABILIFY) 10 MG tablet Take 1 tablet (10 mg total) by mouth daily. 02/28/22   Sarita Bottom, MD  atorvastatin (LIPITOR) 20 MG tablet Take 1 tablet (20 mg total) by mouth daily. 02/27/22   Sarita Bottom, MD  gabapentin (NEURONTIN) 300 MG capsule Take 1 capsule (300 mg total) by mouth 3 (three) times daily. 02/27/22   Sarita Bottom, MD  metFORMIN (GLUCOPHAGE) 850 MG tablet Take 1 tablet (850 mg total) by mouth 2 (two) times daily with a meal. 02/27/22   Abbott Pao, Nadir, MD  sertraline (ZOLOFT) 100 MG tablet Take 1 tablet (100 mg total) by mouth daily. 02/28/22   Sarita Bottom, MD  traZODone (DESYREL) 100 MG tablet Take 1 tablet (100 mg total) by mouth at bedtime. 02/27/22   Sarita Bottom, MD  triamcinolone ointment (KENALOG) 0.1 % Apply topically 2 (two) times daily. 02/27/22   Sarita Bottom, MD      Allergies    Latex and Shellfish-derived products    Review of Systems   Review of Systems  All other systems reviewed and are negative.   Physical Exam Updated Vital Signs BP (!) 169/98   Pulse 85   Temp 99.5 F (37.5 C) (Oral)   Resp 18   Ht 1.702 m (5\' 7" )    Wt 88.5 kg   SpO2 97%   BMI 30.54 kg/m  Physical Exam Vitals and nursing note reviewed.  Constitutional:      General: He is not in acute distress.    Appearance: Normal appearance. He is well-developed. He is not toxic-appearing.  HENT:     Head: Normocephalic and atraumatic.  Eyes:     General: Lids are normal.     Conjunctiva/sclera: Conjunctivae normal.     Pupils: Pupils are equal, round, and reactive to light.  Neck:     Thyroid: No thyroid mass.     Trachea: No tracheal deviation.  Cardiovascular:     Rate and Rhythm: Normal rate and regular rhythm.     Heart sounds: Normal heart sounds. No murmur heard.    No gallop.  Pulmonary:     Effort: Pulmonary effort is normal. No respiratory distress.     Breath sounds: Normal breath sounds. No stridor. No decreased breath sounds, wheezing, rhonchi or rales.  Abdominal:     General: There is no distension.     Palpations: Abdomen is soft.     Tenderness: There is no abdominal tenderness. There is no rebound.  Musculoskeletal:     Right shoulder: Tenderness and bony tenderness present. Decreased range of motion.     Cervical back: Normal range of  motion and neck supple.     Comments: Neurovasc intact at right hand.  Skin:    General: Skin is warm and dry.     Findings: No abrasion or rash.  Neurological:     Mental Status: He is alert and oriented to person, place, and time. Mental status is at baseline.     GCS: GCS eye subscore is 4. GCS verbal subscore is 5. GCS motor subscore is 6.     Cranial Nerves: No cranial nerve deficit.     Sensory: No sensory deficit.     Motor: Motor function is intact.  Psychiatric:        Attention and Perception: Attention normal.        Speech: Speech normal.        Behavior: Behavior normal.     ED Results / Procedures / Treatments   Labs (all labs ordered are listed, but only abnormal results are displayed) Labs Reviewed - No data to display  EKG None  Radiology DG Shoulder  Right  Result Date: 04/21/2022 CLINICAL DATA:  Patient reports right shoulder dislocation yesterday status post reduction. Patient has right shoulder pain. History of right shoulder surgery. EXAM: RIGHT SHOULDER - 2+ VIEW COMPARISON:  Shoulder radiograph 07/12/2006 FINDINGS: Of note, patient motion artifact degrades image quality. There is no evidence of fracture or dislocation. 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 Several tiny globular calcifications are noted adjacent to the greater tuberosity. Soft tissues are unremarkable. IMPRESSION: 1. Of note, patient motion artifact degrades image quality. Within this limitation, no evidence of fracture or dislocation. 2. Tiny calcifications adjacent to the greater tuberosity may reflect calcific tendinitis versus sequela of prior injury. 3. 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 Electronically Signed   By: Sherron Ales M.D.   On: 04/21/2022 18:17    Procedures Procedures    Medications Ordered in ED Medications  oxyCODONE-acetaminophen (PERCOCET/ROXICET) 5-325 MG per tablet 1 tablet (1 tablet Oral Given 04/21/22 1741)    ED Course/ Medical Decision Making/ A&P                           Medical Decision Making  X-ray of patient's right shoulder as above shows no evidence of fracture.  Will be given a sling for comfort.  Will prescribe short course of analgesics and patient to follow-up with orthopedics        Final Clinical Impression(s) / ED Diagnoses Final diagnoses:  None    Rx / DC Orders ED Discharge Orders     None         Lorre Nick, MD 04/21/22 1858

## 2022-04-21 NOTE — ED Notes (Signed)
Otho called for shoulder immobilizer.

## 2022-04-21 NOTE — ED Provider Triage Note (Signed)
Emergency Medicine Provider Triage Evaluation Note  Jeffery Wheeler , a 56 y.o. male  was evaluated in triage.  Pt complains of right shoulder pain, concern for dislocation yesterday. Patient reports limited movement, pain. Reports has happened before, had rotator cuff stabilization surgery several years ago. Denies numbness, tingling.  Review of Systems  Positive: Right shoulder pain Negative: Numbness, tingling  Physical Exam  BP (!) 169/98   Pulse 85   Temp 99.5 F (37.5 C) (Oral)   Resp 18   Ht 5\' 7"  (1.702 m)   Wt 88.5 kg   SpO2 97%   BMI 30.54 kg/m  Gen:   Awake, no distress   Resp:  Normal effort  MSK:   Decreased ROM of right shoulder, inability to crossarm adduct Other:  Intact radial, ulnar pulses throughout  Medical Decision Making  Medically screening exam initiated at 5:22 PM.  Appropriate orders placed.  was informed that the remainder of the evaluation will be completed by another provider, this initial triage assessment does not replace that evaluation, and the importance of remaining in the ED until their evaluation is complete.  Workup initiated   Vena Rua, Olene Floss 04/21/22 1727

## 2022-04-21 NOTE — ED Triage Notes (Signed)
Pt arrived POV w/ c/o R shoulder dislocation that went back in but pt having limited movement, increased pain. Pt has had this happen before.

## 2022-05-13 ENCOUNTER — Ambulatory Visit (HOSPITAL_COMMUNITY): Payer: Self-pay

## 2022-05-15 ENCOUNTER — Other Ambulatory Visit (HOSPITAL_COMMUNITY): Payer: Self-pay

## 2022-09-04 ENCOUNTER — Encounter (HOSPITAL_COMMUNITY): Payer: Self-pay

## 2022-09-04 ENCOUNTER — Ambulatory Visit (INDEPENDENT_AMBULATORY_CARE_PROVIDER_SITE_OTHER): Payer: Commercial Managed Care - HMO

## 2022-09-04 ENCOUNTER — Ambulatory Visit (HOSPITAL_COMMUNITY)
Admission: RE | Admit: 2022-09-04 | Discharge: 2022-09-04 | Disposition: A | Discharge status: Home / Self Care | Payer: Commercial Managed Care - HMO | Source: Ambulatory Visit | Attending: Family Medicine | Admitting: Family Medicine

## 2022-09-04 VITALS — BP 184/100 | HR 74 | Temp 98.5°F | Resp 16 | Ht 67.0 in | Wt 192.0 lb

## 2022-09-04 DIAGNOSIS — S80812A Abrasion, left lower leg, initial encounter: Secondary | ICD-10-CM | POA: Diagnosis not present

## 2022-09-04 DIAGNOSIS — I10 Essential (primary) hypertension: Secondary | ICD-10-CM

## 2022-09-04 DIAGNOSIS — S93402A Sprain of unspecified ligament of left ankle, initial encounter: Secondary | ICD-10-CM | POA: Diagnosis not present

## 2022-09-04 DIAGNOSIS — L089 Local infection of the skin and subcutaneous tissue, unspecified: Secondary | ICD-10-CM

## 2022-09-04 MED ORDER — CEPHALEXIN 500 MG PO CAPS: 500.0000 mg | ORAL_CAPSULE | Freq: Two times a day (BID) | ORAL | 0 refills | Status: AC

## 2022-09-04 MED ORDER — CYCLOBENZAPRINE HCL 10 MG PO TABS: 10.0000 mg | ORAL_TABLET | Freq: Two times a day (BID) | ORAL | 0 refills | Status: DC | PRN

## 2022-09-04 MED ORDER — INDOMETHACIN 50 MG PO CAPS: 50.0000 mg | ORAL_CAPSULE | Freq: Two times a day (BID) | ORAL | 0 refills | Status: AC

## 2022-09-04 NOTE — Discharge Instructions (Addendum)
X-ray of your ankle does not show a fracture. Continue to Ace wrap with all weightbearing activity. I am prescribing you indomethacin 50 mg twice daily for 7 days to take for inflammation and pain related to ankle injury.  I am also placing you on Keflex as the abrasions on your leg appear to be infected complete entire course of antibiotics.  If you continue to have ankle pain and shin pain following treatment with the indomethacin I have included information for you to follow-up with orthopedic office. Resume taking your Norvasc as your blood pressure is elevated here today.

## 2022-09-04 NOTE — ED Provider Notes (Signed)
[facility]    CSN: [id number] Arrival date & time: 09/04/22  1336     History   Chief Complaint Chief Complaint  Patient presents with   Ankle Pain    HPI Jeffery Wheeler is a 57 y.o. male.   HPI Patient presents today for evaluation of an left ankle injury in which he stepped in a open hole in his yard accidentally 5 days ago and twisted his ankle.  He also sustained a fall and has abrasions on the left lower leg which are painful to touch, localized swelling, and redness.  Patient suffers from type 2 diabetes and hypertension.  Of note patient's blood pressure is 184/100 he reports that he has blood pressure medication at home he establish with a primary care provider 2 days ago and was told to hold medication as his medications were currently under review and he is to follow back up this week with his new provider at [facility] to start a new medication regimen as he is prescribed some any medications his new doctor is trying to reduce his pill burden.  He endorses that he has amlodipine at home did not take the medication today.  He is asymptomatic of any chest pain, shortness of breath, weakness or dizziness or headache.  Patient reports that his ankle pain worsens with ambulation and when he has to put his shoe on.  After injury occurred he did not apply any ice and has not taken any OTC medication with the exception of a dose of Tylenol which did not alleviate pain.   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   Polysubstance abuse (HCC) 01/25/2022   Cocaine-induced mood disorder (HCC)    Cocaine use disorder, severe, dependence (HCC)    Increased urinary frequency  11/20/2020   Nocturia more than twice per night 11/20/2020   Incomplete emptying of bladder 11/20/2020   Psychophysiological insomnia 11/20/2020   Generalized anxiety disorder 11/20/2020   Alcohol abuse 11/20/2020   Substance abuse (HCC) 11/20/2020   HLD (hyperlipidemia) 09/13/2020   HTN (hypertension) 09/13/2020   Type 2 diabetes mellitus (HCC) 09/13/2020   Back pain 09/13/2020   Eczema 09/13/2020   Tobacco use disorder 09/13/2020    Past Surgical History:  Procedure Laterality Date   CERVICAL SPINE SURGERY  2018   right shoulder  1991       Home Medications    Prior to Admission medications   Medication Sig Start Date End Date Taking? Authorizing Provider  cephALEXin (KEFLEX) 500 MG capsule Take 1 capsule (500 mg total) by mouth 2 (two) times daily for 7 days. 09/04/22 09/11/22 Yes Bing Neighbors, NP  cyclobenzaprine (FLEXERIL) 10 MG tablet Take 1 tablet (10 mg total) by mouth 2 (two) times daily as needed (As needed for ankle and leg pain). 09/04/22  Yes Bing Neighbors, NP  indomethacin (INDOCIN) 50 MG capsule Take 1 capsule (50 mg total) by mouth 2 (two) times daily with a meal for 7 days. 09/04/22 09/11/22 Yes Bing Neighbors, NP  amLODipine (NORVASC) 10 MG tablet Take 1 tablet (10 mg total) by mouth daily. 02/27/22   Sarita Bottom, MD  ARIPiprazole (ABILIFY) 10 MG tablet Take 1 tablet (10 mg total) by mouth daily. 02/28/22   Sarita Bottom, MD  atorvastatin (LIPITOR) 20 MG tablet Take 1 tablet (20 mg total) by mouth daily. 02/27/22   Sarita Bottom, MD  gabapentin (NEURONTIN) 300 MG capsule Take 1 capsule (300 mg total) by mouth 3 (three) times daily. 02/27/22   Sarita Bottom, MD  metFORMIN (GLUCOPHAGE) 850 MG tablet Take 1 tablet (850 mg total) by mouth 2 (two) times daily with a meal. 02/27/22   Abbott Pao, Nadir, MD  sertraline (ZOLOFT) 100 MG tablet Take 1 tablet (100 mg total) by mouth daily. 02/28/22   Sarita Bottom, MD  traZODone (DESYREL) 100 MG tablet Take 1 tablet (100  mg total) by mouth at bedtime. 02/27/22   Sarita Bottom, MD  triamcinolone ointment (KENALOG) 0.1 % Apply topically 2 (two) times daily. 02/27/22   Sarita Bottom, MD    Family History Family History  Problem Relation Age of Onset   Diabetes type II Sister    Diabetes type II Paternal Grandmother    Diabetes type II Maternal Aunt     Social History Social History   Tobacco Use   Smoking status: Never   Smokeless tobacco: Never  Substance Use Topics   Alcohol use: Yes    Alcohol/week: 200.0 standard drinks of alcohol    Types: 80 Cans of beer, 120 Shots of liquor per week    Comment: reports periodically drinking as much as 4x 40 oz malt liquer and 1x fifth of tequila daily as fund permit   Drug use: Yes    Types: "Crack" cocaine, Methamphetamines, Marijuana, Fentanyl    Comment: Consistantly using crack cocaine almost on a daily basis, cannabis occasionally, other drugs as presented to him by other drug users     Allergies   Latex and Shellfish-derived products   Review of Systems Review of Systems Pertinent negatives listed in HPI   Physical Exam Triage Vital Signs ED Triage Vitals  Enc Vitals Group     BP 09/04/22 1404 (!) 184/100     Pulse Rate 09/04/22 1404 74     Resp 09/04/22 1404 16     Temp 09/04/22 1404 98.5 F (36.9 C)     Temp Source 09/04/22 1404 Oral     SpO2 09/04/22 1404 96 %     Weight 09/04/22 1403 192 lb (87.1 kg)     Height 09/04/22 1403 5\' 7"  (1.702 m)     Head Circumference --      Peak Flow --      Pain Score 09/04/22 1359 6     Pain Loc --      Pain Edu? --      Excl. in GC? --    No data found.  Updated Vital Signs BP (!) 184/100 (BP Location: Left Arm)   Pulse 74   Temp 98.5 F (36.9 C) (Oral)   Resp 16   Ht 5\' 7"  (1.702 m)   Wt 192 lb (87.1 kg)   SpO2 96%   BMI 30.07 kg/m   Visual Acuity Right Eye Distance:   Left Eye Distance:   Bilateral Distance:    Right Eye Near:   Left Eye Near:    Bilateral Near:      Physical Exam Constitutional:      Appearance: Normal appearance.  HENT:     Head: Normocephalic and atraumatic.  Eyes:     Extraocular Movements: Extraocular movements intact.     Pupils:  Pupils are equal, round, and reactive to light.  Cardiovascular:     Rate and Rhythm: Normal rate and regular rhythm.  Pulmonary:     Effort: Pulmonary effort is normal.     Breath sounds: Normal breath sounds and air entry.  Musculoskeletal:     Cervical back: Normal range of motion.     Left ankle: Swelling present. Tenderness present over the posterior TF ligament.     Left Achilles Tendon: Tenderness present. Thompson's test negative.  Skin:      Neurological:     General: No focal deficit present.     Mental Status: He is alert.  Psychiatric:        Behavior: Behavior is cooperative.      UC Treatments / Results  Labs (all labs ordered are listed, but only abnormal results are displayed) Labs Reviewed - No data to display  EKG   Radiology DG Ankle Complete Left  Result Date: 09/04/2022 CLINICAL DATA:  Twisting injury, swelling and pain EXAM: LEFT ANKLE COMPLETE - 3+ VIEW COMPARISON:  None Available. FINDINGS: There is no evidence of fracture, dislocation, or joint effusion. There is no evidence of arthropathy or other focal bone abnormality. Soft tissues are unremarkable. IMPRESSION: Negative. Electronically Signed   By: Judie Petit.  Shick M.D.   On: 09/04/2022 14:37    Procedures Procedures (including critical care time)  Medications Ordered in UC Medications - No data to display  Initial Impression / Assessment and Plan / UC Course  I have reviewed the triage vital signs and the nursing notes.  Pertinent labs & imaging results that were available during my care of the patient were reviewed by me and considered in my medical decision making (see chart for details).    Sprain of left ankle, imaging unremarkable.  Ace wrap applied to left ankle.  Indomethacin 50 mg twice daily  for 7 days to reduce inflammation and swelling.  Cyclobenzaprine 10 mg twice daily as needed for pain.  Patient is a diabetic therefore opted against prednisone.  Keflex twice daily for abrasions that appear to be infected.  Patient's blood pressure is elevated he advises that he has medication at home advised to resume taking blood pressure medication and contact PCP to advise a blood pressure reading here today.  Return precautions given. Final Clinical Impressions(s) / UC Diagnoses   Final diagnoses:  Sprain of left ankle, unspecified ligament, initial encounter  Infected abrasion of skin of left lower leg  Elevated blood pressure reading in office with diagnosis of hypertension     Discharge Instructions      X-ray of your ankle does not show a fracture. Continue to Ace wrap with all weightbearing activity. I am prescribing you indomethacin 50 mg twice daily for 7 days to take for inflammation and pain related to ankle injury.  I am also placing you on Keflex as the abrasions on your leg appear to be infected complete entire course of antibiotics.  If you continue to have ankle pain and shin pain following treatment with the indomethacin I have included information for you to follow-up with orthopedic office. Resume taking your Norvasc as your blood pressure is elevated here today.     ED Prescriptions     Medication Sig Dispense Auth. Provider   indomethacin (INDOCIN) 50 MG capsule Take 1 capsule (50 mg total) by mouth 2 (two) times daily with a meal for 7 days. 14 capsule Bing Neighbors, NP   cyclobenzaprine (FLEXERIL) 10 MG tablet Take  1 tablet (10 mg total) by mouth 2 (two) times daily as needed (As needed for ankle and leg pain). 10 tablet Bing Neighbors, NP   cephALEXin (KEFLEX) 500 MG capsule Take 1 capsule (500 mg total) by mouth 2 (two) times daily for 7 days. 14 capsule Bing Neighbors, NP      PDMP not reviewed this encounter.   Bing Neighbors,  NP 09/04/22 [phone]

## 2022-09-04 NOTE — ED Triage Notes (Signed)
Patient here today with c/o left posterior ankle pain and left shin pain after falling into a water meter hole on Friday. This was in his yard and the plate that normally covers it was not closed properly and it flipped when he stepped on it. Patient has abrasions to his left shin and posterior left ankle. Pain level is a 6/10. He has been taking Tylenol but not helping much.

## 2022-09-11 ENCOUNTER — Emergency Department (HOSPITAL_COMMUNITY): Payer: Commercial Managed Care - HMO

## 2022-09-11 ENCOUNTER — Emergency Department (HOSPITAL_COMMUNITY)
Admission: EM | Admit: 2022-09-11 | Discharge: 2022-09-11 | Disposition: A | Discharge status: Home / Self Care | Payer: Commercial Managed Care - HMO | Attending: Emergency Medicine | Admitting: Emergency Medicine

## 2022-09-11 ENCOUNTER — Encounter (HOSPITAL_COMMUNITY): Payer: Self-pay

## 2022-09-11 ENCOUNTER — Other Ambulatory Visit: Payer: Self-pay

## 2022-09-11 DIAGNOSIS — N289 Disorder of kidney and ureter, unspecified: Secondary | ICD-10-CM

## 2022-09-11 DIAGNOSIS — E119 Type 2 diabetes mellitus without complications: Secondary | ICD-10-CM | POA: Insufficient documentation

## 2022-09-11 DIAGNOSIS — Z79899 Other long term (current) drug therapy: Secondary | ICD-10-CM | POA: Diagnosis not present

## 2022-09-11 DIAGNOSIS — Y92003 Bedroom of unspecified non-institutional (private) residence as the place of occurrence of the external cause: Secondary | ICD-10-CM | POA: Insufficient documentation

## 2022-09-11 DIAGNOSIS — M25531 Pain in right wrist: Secondary | ICD-10-CM | POA: Insufficient documentation

## 2022-09-11 DIAGNOSIS — Y9339 Activity, other involving climbing, rappelling and jumping off: Secondary | ICD-10-CM | POA: Diagnosis not present

## 2022-09-11 DIAGNOSIS — M25571 Pain in right ankle and joints of right foot: Secondary | ICD-10-CM | POA: Diagnosis not present

## 2022-09-11 DIAGNOSIS — I10 Essential (primary) hypertension: Secondary | ICD-10-CM | POA: Insufficient documentation

## 2022-09-11 DIAGNOSIS — Z91040 Latex allergy status: Secondary | ICD-10-CM | POA: Insufficient documentation

## 2022-09-11 DIAGNOSIS — Z7984 Long term (current) use of oral hypoglycemic drugs: Secondary | ICD-10-CM | POA: Insufficient documentation

## 2022-09-11 DIAGNOSIS — E278 Other specified disorders of adrenal gland: Secondary | ICD-10-CM

## 2022-09-11 DIAGNOSIS — S161XXA Strain of muscle, fascia and tendon at neck level, initial encounter: Secondary | ICD-10-CM

## 2022-09-11 DIAGNOSIS — W06XXXA Fall from bed, initial encounter: Secondary | ICD-10-CM | POA: Diagnosis not present

## 2022-09-11 DIAGNOSIS — M25551 Pain in right hip: Secondary | ICD-10-CM | POA: Diagnosis not present

## 2022-09-11 DIAGNOSIS — F1210 Cannabis abuse, uncomplicated: Secondary | ICD-10-CM | POA: Diagnosis not present

## 2022-09-11 DIAGNOSIS — T07XXXA Unspecified multiple injuries, initial encounter: Secondary | ICD-10-CM

## 2022-09-11 DIAGNOSIS — M542 Cervicalgia: Secondary | ICD-10-CM | POA: Diagnosis not present

## 2022-09-11 DIAGNOSIS — R072 Precordial pain: Secondary | ICD-10-CM | POA: Diagnosis not present

## 2022-09-11 DIAGNOSIS — M79604 Pain in right leg: Secondary | ICD-10-CM | POA: Diagnosis not present

## 2022-09-11 DIAGNOSIS — S060XAA Concussion with loss of consciousness status unknown, initial encounter: Secondary | ICD-10-CM

## 2022-09-11 DIAGNOSIS — Y900 Blood alcohol level of less than 20 mg/100 ml: Secondary | ICD-10-CM | POA: Insufficient documentation

## 2022-09-11 DIAGNOSIS — R7989 Other specified abnormal findings of blood chemistry: Secondary | ICD-10-CM | POA: Insufficient documentation

## 2022-09-11 DIAGNOSIS — M79601 Pain in right arm: Secondary | ICD-10-CM | POA: Diagnosis not present

## 2022-09-11 DIAGNOSIS — F1410 Cocaine abuse, uncomplicated: Secondary | ICD-10-CM | POA: Insufficient documentation

## 2022-09-11 DIAGNOSIS — S0990XA Unspecified injury of head, initial encounter: Secondary | ICD-10-CM | POA: Diagnosis present

## 2022-09-11 LAB — COMPREHENSIVE METABOLIC PANEL
ALT: 21 U/L (ref 0–44)
AST: 28 U/L (ref 15–41)
Albumin: 4.1 g/dL (ref 3.5–5.0)
Alkaline Phosphatase: 78 U/L (ref 38–126)
Anion gap: 15 (ref 5–15)
BUN: 30 mg/dL — ABNORMAL HIGH (ref 6–20)
CO2: 19 mmol/L — ABNORMAL LOW (ref 22–32)
Calcium: 9.4 mg/dL (ref 8.9–10.3)
Chloride: 103 mmol/L (ref 98–111)
Creatinine, Ser: 1.79 mg/dL — ABNORMAL HIGH (ref 0.61–1.24)
GFR, Estimated: 43 mL/min — ABNORMAL LOW (ref >60)
Glucose, Bld: 129 mg/dL — ABNORMAL HIGH (ref 70–99)
Potassium: 4.3 mmol/L (ref 3.5–5.1)
Sodium: 137 mmol/L (ref 135–145)
Total Bilirubin: 0.8 mg/dL (ref 0.3–1.2)
Total Protein: 7 g/dL (ref 6.5–8.1)

## 2022-09-11 LAB — RAPID URINE DRUG SCREEN, HOSP PERFORMED
Amphetamines: NOT DETECTED
Barbiturates: NOT DETECTED
Benzodiazepines: NOT DETECTED
Cocaine: POSITIVE — AB
Opiates: NOT DETECTED
Tetrahydrocannabinol: POSITIVE — AB

## 2022-09-11 LAB — CBC
HCT: 42.9 % (ref 39.0–52.0)
Hemoglobin: 13.8 g/dL (ref 13.0–17.0)
MCH: 26.5 pg (ref 26.0–34.0)
MCHC: 32.2 g/dL (ref 30.0–36.0)
MCV: 82.5 fL (ref 80.0–100.0)
Platelets: 388 10*3/uL (ref 150–400)
RBC: 5.2 MIL/uL (ref 4.22–5.81)
RDW: 15.9 % — ABNORMAL HIGH (ref 11.5–15.5)
WBC: 25.4 10*3/uL — ABNORMAL HIGH (ref 4.0–10.5)
nRBC: 0 % (ref 0.0–0.2)

## 2022-09-11 LAB — TROPONIN I (HIGH SENSITIVITY)
Troponin I (High Sensitivity): 82 ng/L — ABNORMAL HIGH (ref <18)
Troponin I (High Sensitivity): 66 ng/L — ABNORMAL HIGH (ref <18)

## 2022-09-11 LAB — ETHANOL: Alcohol, Ethyl (B): <10 mg/dL (ref <10)

## 2022-09-11 MED ORDER — SODIUM CHLORIDE 0.9 % IV BOLUS (SEPSIS)
1000.0000 mL | Freq: Once | INTRAVENOUS | Status: AC
  Administered 2022-09-11: 1000 mL via INTRAVENOUS

## 2022-09-11 MED ORDER — FENTANYL CITRATE PF 50 MCG/ML IJ SOSY
50.0000 ug | PREFILLED_SYRINGE | Freq: Once | INTRAMUSCULAR | Status: AC
  Administered 2022-09-11: 50 ug via INTRAVENOUS
  Filled 2022-09-11: qty 1

## 2022-09-11 MED ORDER — IOHEXOL 350 MG/ML SOLN
75.0000 mL | Freq: Once | INTRAVENOUS | Status: AC | PRN
  Administered 2022-09-11: 75 mL via INTRAVENOUS

## 2022-09-11 NOTE — ED Provider Notes (Signed)
[place] EMERGENCY DEPARTMENT AT [facility] Provider Note   CSN: [id number] Arrival date & time: 09/11/22  0439     History  Chief Complaint  Patient presents with   Chest Pain   Arm Pain   Neck Pain    Jeffery Wheeler is a 57 y.o. male.  The history is provided by the patient.  Patient hypertension, diabetes cocaine use disorder presents with multiple complaints.  Patient reports he was sleeping tonight when he woke up with severe pain in his chest.  He reports it felt like cramping in his chest. He reports he jumped out of bed and soon after he fell over on his right side.  He is unsure if he lost consciousness.  He has pain all throughout the right side of his body.  He reports headache, neck pain, right arm and right leg pain.  No abdominal pain.  He does not take anticoagulation.  He reports he was feeling well prior to going to bed.  No known history of CAD/VTE    Past Medical History:  Diagnosis Date   Eczema    HLD (hyperlipidemia)    HTN (hypertension)    Type 2 diabetes mellitus (HCC)     Home Medications Prior to Admission medications   Medication Sig Start Date End Date Taking? Authorizing Provider  amLODipine (NORVASC) 10 MG tablet Take 1 tablet (10 mg total) by mouth daily. 02/27/22   Sarita Bottom, MD  ARIPiprazole (ABILIFY) 10 MG tablet Take 1 tablet (10 mg total) by mouth daily. 02/28/22   Sarita Bottom, MD  atorvastatin (LIPITOR) 20 MG tablet Take 1 tablet (20 mg total) by mouth daily. 02/27/22   Sarita Bottom, MD  cephALEXin (KEFLEX) 500 MG capsule Take 1 capsule (500 mg total) by mouth 2 (two) times daily for 7 days. 09/04/22 09/11/22  Bing Neighbors, NP  cyclobenzaprine (FLEXERIL) 10 MG tablet Take 1 tablet (10 mg total) by mouth 2 (two) times daily as needed (As needed for ankle and leg pain). 09/04/22   Bing Neighbors, NP  gabapentin (NEURONTIN) 300 MG capsule Take 1 capsule (300 mg total) by mouth 3 (three) times daily. 02/27/22    Sarita Bottom, MD  indomethacin (INDOCIN) 50 MG capsule Take 1 capsule (50 mg total) by mouth 2 (two) times daily with a meal for 7 days. 09/04/22 09/11/22  Bing Neighbors, NP  metFORMIN (GLUCOPHAGE) 850 MG tablet Take 1 tablet (850 mg total) by mouth 2 (two) times daily with a meal. 02/27/22   Sarita Bottom, MD  sertraline (ZOLOFT) 100 MG tablet Take 1 tablet (100 mg total) by mouth daily. 02/28/22   Sarita Bottom, MD  traZODone (DESYREL) 100 MG tablet Take 1 tablet (100 mg total) by mouth at bedtime. 02/27/22   Sarita Bottom, MD  triamcinolone ointment (KENALOG) 0.1 % Apply topically 2 (two) times daily. 02/27/22   Sarita Bottom, MD      Allergies    Latex and Shellfish-derived products    Review of Systems   Review of Systems  Cardiovascular:  Positive for chest pain.  Musculoskeletal:  Positive for neck pain.  Neurological:  Positive for headaches.    Physical Exam Updated Vital Signs BP (!) 143/89 (BP Location: Left Arm)   Pulse 82   Temp (!) 97.5 F (36.4 C) (Oral)   Resp 18   Ht 1.702 m (5\' 7" )   Wt 87.1 kg   SpO2 94%   BMI 30.07 kg/m  Physical Exam CONSTITUTIONAL:  Disheveled, anxious HEAD: Normocephalic/atraumatic, diffuse tenderness noted EYES: EOMI/PERRL ENMT: Mucous membranes moist, poor dentition but no signs of any dental or nasal trauma NECK: Cervical collar in place SPINE/BACK: Diffuse cervical spine tenderness, no thoracic or lumbar tenderness No bruising/crepitance/stepoffs noted to spine Patient maintained in spinal precautions/logroll utilized CV: S1/S2 noted, no murmurs/rubs/gallops noted LUNGS: Lungs are clear to auscultation bilaterally, no apparent distress Chest-diffuse chest wall tenderness but no bruising or crepitus ABDOMEN: soft, nontender, no bruising GU:no flank bruising NEURO: Pt is awake/alert GCS 15 Limitation movement of right arm and right leg due to severe pain He is able to move all other extremities without difficulty EXTREMITIES:  pulses normal/equal, full ROM Diffuse tenderness noted to right wrist and hand, diffuse tenderness noted to right hip and with range of motion.  Tenderness noted to right foot and ankle. All other extremities/joints palpated/ranged and nontender SKIN: warm, color normal PSYCH: Anxious  ED Results / Procedures / Treatments   Labs (all labs ordered are listed, but only abnormal results are displayed) Labs Reviewed  COMPREHENSIVE METABOLIC PANEL - Abnormal; Notable for the following components:      Result Value   CO2 19 (*)    Glucose, Bld 129 (*)    BUN 30 (*)    Creatinine, Ser 1.79 (*)    GFR, Estimated 43 (*)    All other components within normal limits  CBC - Abnormal; Notable for the following components:   WBC 25.4 (*)    RDW 15.9 (*)    All other components within normal limits  TROPONIN I (HIGH SENSITIVITY) - Abnormal; Notable for the following components:   Troponin I (High Sensitivity) 82 (*)    All other components within normal limits  ETHANOL  RAPID URINE DRUG SCREEN, HOSP PERFORMED  TROPONIN I (HIGH SENSITIVITY)    EKG EKG Interpretation  Date/Time:  Wednesday Sep 11 2022 04:39:53 EDT Ventricular Rate:  93 PR Interval:  145 QRS Duration: 78 QT Interval:  373 QTC Calculation: 464 R Axis:   40 Text Interpretation: Sinus rhythm Probable left atrial enlargement Borderline ST elevation, anterior leads Interpretation limited secondary to artifact Confirmed by Zadie Rhine ([id number]) on 09/11/2022 4:44:12 AM  Radiology DG Hip Unilat W or Wo Pelvis 2-3 Views Right  Result Date: 09/11/2022 CLINICAL DATA:  Fall with pain. EXAM: DG HIP (WITH OR WITHOUT PELVIS) 3V RIGHT COMPARISON:  None Available. FINDINGS: There is no evidence of hip fracture or dislocation. Degenerative spurring with subchondral cystic change affecting the right more than left hip. Mild right hip joint narrowing. Clips over the left lower quadrant which could be for hemostasis. IMPRESSION: No acute  finding. Right hip osteoarthritis. Electronically Signed   By: [name] M.D.   On: 09/11/2022 05:42   DG Ankle 2 Views Right  Result Date: 09/11/2022 CLINICAL DATA:  Status post fall. EXAM: RIGHT ANKLE - 2 VIEW COMPARISON:  None Available. FINDINGS: There is no evidence of fracture, dislocation, or joint effusion. There is no evidence of arthropathy or other focal bone abnormality. Soft tissues are unremarkable. IMPRESSION: Negative. Electronically Signed   By: Signa Kell M.D.   On: 09/11/2022 05:41   DG Hand 2 View Right  Result Date: 09/11/2022 CLINICAL DATA:  Fall. EXAM: RIGHT HAND - 2 VIEW COMPARISON:  None Available. FINDINGS: There is no evidence of fracture or dislocation. There is no evidence of arthropathy or other focal bone abnormality. Soft tissues are unremarkable. IMPRESSION: Negative. Electronically Signed   By: Audry Riles.D.  On: 09/11/2022 05:41   DG Foot 2 Views Right  Result Date: 09/11/2022 CLINICAL DATA:  Fall. EXAM: RIGHT FOOT - 2 VIEW COMPARISON:  None Available. FINDINGS: There is no evidence of fracture or dislocation. Tiny heel spur. Soft tissues are unremarkable. IMPRESSION: Negative. Electronically Signed   By: [name] M.D.   On: 09/11/2022 05:41   DG Wrist 2 Views Right  Result Date: 09/11/2022 CLINICAL DATA:  Fall. EXAM: RIGHT WRIST - 2 VIEW COMPARISON:  None Available. FINDINGS: There is no evidence of fracture or dislocation. There is no evidence of arthropathy or other focal bone abnormality. Soft tissues are unremarkable. IMPRESSION: Negative. Electronically Signed   By: [name] M.D.   On: 09/11/2022 05:40   DG Chest Port 1 View  Result Date: 09/11/2022 CLINICAL DATA:  Trauma EXAM: PORTABLE CHEST 1 VIEW COMPARISON:  03/05/2022 FINDINGS: Heart size is normal. No pleural fluid or airspace disease. No signs of interstitial edema. Visualized osseous structures are unremarkable. IMPRESSION: No acute abnormality. Electronically Signed    By: Signa Kell M.D.   On: 09/11/2022 05:37   CT HEAD WO CONTRAST  Result Date: 09/11/2022 CLINICAL DATA:  Trauma.  Status post fall. EXAM: CT HEAD WITHOUT CONTRAST CT CERVICAL SPINE WITHOUT CONTRAST TECHNIQUE: Multidetector CT imaging of the head and cervical spine was performed following the standard protocol without intravenous contrast. Multiplanar CT image reconstructions of the cervical spine were also generated. RADIATION DOSE REDUCTION: This exam was performed according to the departmental dose-optimization program which includes automated exposure control, adjustment of the mA and/or kV according to patient size and/or use of iterative reconstruction technique. COMPARISON:  None Available. FINDINGS: CT HEAD FINDINGS Brain: No evidence of acute infarction, hemorrhage, hydrocephalus, extra-axial collection or mass lesion/mass effect. Vascular: No hyperdense vessel or unexpected calcification. Skull: Normal. Negative for fracture or focal lesion. Sinuses/Orbits: Mild mucosal thickening is noted involving the maxillary sinuses, frontal sinus and ethmoid air cells. Opacification of the right mastoid air cells. Other: None. CT CERVICAL SPINE FINDINGS Alignment: Normal. Skull base and vertebrae: No acute fracture. No primary bone lesion or focal pathologic process. Soft tissues and spinal canal: No prevertebral fluid or swelling. No visible canal hematoma. Disc levels: Status post ACDF at C4 through C7 with solid fusion of the C5-6 and C6-7 disc spaces. Upper chest: Negative. Other: None IMPRESSION: 1. No acute intracranial abnormality seen. 2. Status post ACDF at C4 through C7 with solid fusion of the C5-6 and C6-7 disc spaces. 3. No acute abnormality seen in the cervical spine. Electronically Signed   By: Signa Kell M.D.   On: 09/11/2022 05:34   CT CERVICAL SPINE WO CONTRAST  Result Date: 09/11/2022 CLINICAL DATA:  Trauma.  Status post fall. EXAM: CT HEAD WITHOUT CONTRAST CT CERVICAL SPINE WITHOUT  CONTRAST TECHNIQUE: Multidetector CT imaging of the head and cervical spine was performed following the standard protocol without intravenous contrast. Multiplanar CT image reconstructions of the cervical spine were also generated. RADIATION DOSE REDUCTION: This exam was performed according to the departmental dose-optimization program which includes automated exposure control, adjustment of the mA and/or kV according to patient size and/or use of iterative reconstruction technique. COMPARISON:  None Available. FINDINGS: CT HEAD FINDINGS Brain: No evidence of acute infarction, hemorrhage, hydrocephalus, extra-axial collection or mass lesion/mass effect. Vascular: No hyperdense vessel or unexpected calcification. Skull: Normal. Negative for fracture or focal lesion. Sinuses/Orbits: Mild mucosal thickening is noted involving the maxillary sinuses, frontal sinus and ethmoid air cells. Opacification of the right  mastoid air cells. Other: None. CT CERVICAL SPINE FINDINGS Alignment: Normal. Skull base and vertebrae: No acute fracture. No primary bone lesion or focal pathologic process. Soft tissues and spinal canal: No prevertebral fluid or swelling. No visible canal hematoma. Disc levels: Status post ACDF at C4 through C7 with solid fusion of the C5-6 and C6-7 disc spaces. Upper chest: Negative. Other: None IMPRESSION: 1. No acute intracranial abnormality seen. 2. Status post ACDF at C4 through C7 with solid fusion of the C5-6 and C6-7 disc spaces. 3. No acute abnormality seen in the cervical spine. Electronically Signed   By: Signa Kell M.D.   On: 09/11/2022 05:34    Procedures Procedures    Medications Ordered in ED Medications  sodium chloride 0.9 % bolus 1,000 mL (1,000 mLs Intravenous New Bag/Given 09/11/22 0701)  sodium chloride 0.9 % bolus 1,000 mL (has no administration in time range)  fentaNYL (SUBLIMAZE) injection 50 mcg (50 mcg Intravenous Given 09/11/22 0502)  fentaNYL (SUBLIMAZE) injection 50 mcg  (50 mcg Intravenous Given 09/11/22 0701)    ED Course/ Medical Decision Making/ A&P Clinical Course as of 09/11/22 0706  Wed Sep 11, 2022  0547 WBC(!): 25.4 Leukocytosis [DW]  0558 No acute injuries noted on CT imaging or x-rays. Patient is able to move both right arm and right leg at this time but is limited due to pain.  He is able to lift his right arm above his head, and he is able to flex at the right hip limited due to pain.  No other signs of acute traumatic injury [DW]  0600 Creatinine(!): 1.79  acute kidney injury noted [DW]  0600 Troponin is mildly elevated, though most of his CP was reproducible.  Could be due to underlying renal insufficiency.  No acute EKG changes.  Will continue to monitor for now and trend troponin [DW]  0703 Plan at signout is to give fluids, reassess.  Ensure patient can ambulate.  If troponin is flat or decreasing patient is can be safely discharged home as suspicion for ACS/PE/dissection is low.  Troponin elevation could be due to underlying renal insufficiency.  Signed out to Dr. Hyacinth Meeker at shift change [DW]    Clinical Course User Index [DW] Zadie Rhine, MD                             Medical Decision Making Amount and/or Complexity of Data Reviewed Labs: ordered. Decision-making details documented in ED Course. Radiology: ordered.  Risk Prescription drug management.   This patient presents to the ED for concern of chest pain, this involves an extensive number of treatment options, and is a complaint that carries with it a high risk of complications and morbidity.  The differential diagnosis includes but is not limited to acute coronary syndrome, aortic dissection, pulmonary embolism, pericarditis, pneumothorax, pneumonia, myocarditis, pleurisy, esophageal rupture    Comorbidities that complicate the patient evaluation: Patient's presentation is complicated by their history of hypertension  Social Determinants of Health: Patient's  substance  use disorder   increases the complexity of managing their presentation  Additional history obtained: Records reviewed  urgent care notes reviewed  Lab Tests: I Ordered, and personally interpreted labs.  The pertinent results include:  acute kidney injury, elevated troponin  Imaging Studies ordered: I ordered imaging studies including CT scan head and C-spine, extremity x-rays   I independently visualized and interpreted imaging which showed no acute findings I agree with the radiologist interpretation  Cardiac Monitoring: The patient was maintained on a cardiac monitor.  I personally viewed and interpreted the cardiac monitor which showed an underlying rhythm of:  sinus rhythm  Medicines ordered and prescription drug management: I ordered medication including fentanyl for pain Reevaluation of the patient after these medicines showed that the patient    improved  Reevaluation: After the interventions noted above, I reevaluated the patient and found that they have :improved  Complexity of problems addressed: Patient's presentation is most consistent with  acute presentation with potential threat to life or bodily function          Final Clinical Impression(s) / ED Diagnoses Final diagnoses:  Precordial pain  Concussion with unknown loss of consciousness status, initial encounter  Strain of neck muscle, initial encounter  Renal insufficiency  Contusion of multiple sites    Rx / DC Orders ED Discharge Orders     None         Zadie Rhine, MD 09/11/22 [phone]

## 2022-09-11 NOTE — Discharge Instructions (Signed)
Your testing showed that you are a little bit dehydrated.  We have given you some IV fluids and check to make sure he did not have a blood clot or heart attack.  All of these test were normal.  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  Please make an appointment today to follow-up these results in the office within the next 2 weeks.  If you should develop severe or worsening symptoms including chest pain shortness of breath or any other concerning symptoms return to the emergency department immediately.  Thank you for allowing [place] to treat you in the emergency department today.  After reviewing your examination and potential testing that was done it appears that you are safe to go home.  I would like for you to follow-up with your doctor within the next several days, have them obtain your results and follow-up with them to review all of these tests.  If you should develop severe or worsening symptoms return to the emergency department immediately

## 2022-09-11 NOTE — ED Provider Notes (Signed)
Patient presents with chest pain, known user of cocaine, cocaine positive on drug screen.  Chest pain seems to be coming and going like a cramping, workup showed some renal insufficiency for which she will need follow-up labs, he was made aware of this, he was also made aware of the adrenal abnormality and need for follow-up on CT scan.  CT angiogram revealed no signs of pulmonary embolism, no signs of MI clinically, vital signs reflect mild hypertension but stable for discharge.   Eber Hong, MD 09/11/22 [phone]

## 2022-09-11 NOTE — ED Triage Notes (Signed)
Pt in from home via [facility] with c/o sudden L cp that woke him up from sleep tonight. Pt states he got up to go tell his mom, and just remembers falling - unsure if LOC, cannot remember full details. Pt also reporting pain to R neck, R arm, R leg and is unable to move either without wincing in pain. C-collar present on arrival, pt denies any numbness/tingling of extremities. No thinners. Did have a fall into a water meter hole recently, and states he had injury to L ankle

## 2022-09-16 ENCOUNTER — Ambulatory Visit: Payer: Commercial Managed Care - HMO | Attending: Audiologist | Admitting: Audiologist

## 2022-09-21 ENCOUNTER — Emergency Department (HOSPITAL_COMMUNITY)
Admission: EM | Admit: 2022-09-21 | Discharge: 2022-09-21 | Disposition: A | Discharge status: Home / Self Care | Payer: Commercial Managed Care - HMO | Attending: Emergency Medicine | Admitting: Emergency Medicine

## 2022-09-21 ENCOUNTER — Emergency Department (HOSPITAL_COMMUNITY): Payer: Commercial Managed Care - HMO

## 2022-09-21 ENCOUNTER — Other Ambulatory Visit: Payer: Self-pay

## 2022-09-21 DIAGNOSIS — E860 Dehydration: Secondary | ICD-10-CM | POA: Diagnosis not present

## 2022-09-21 DIAGNOSIS — R55 Syncope and collapse: Secondary | ICD-10-CM | POA: Diagnosis present

## 2022-09-21 LAB — CBC WITH DIFFERENTIAL/PLATELET
Abs Immature Granulocytes: 0.14 10*3/uL — ABNORMAL HIGH (ref 0.00–0.07)
Basophils Absolute: 0.1 10*3/uL (ref 0.0–0.1)
Basophils Relative: 1 %
Eosinophils Absolute: 0.2 10*3/uL (ref 0.0–0.5)
Eosinophils Relative: 1 %
HCT: 44.7 % (ref 39.0–52.0)
Hemoglobin: 14.1 g/dL (ref 13.0–17.0)
Immature Granulocytes: 1 %
Lymphocytes Relative: 9 %
Lymphs Abs: 1.6 10*3/uL (ref 0.7–4.0)
MCH: 27.1 pg (ref 26.0–34.0)
MCHC: 31.5 g/dL (ref 30.0–36.0)
MCV: 85.8 fL (ref 80.0–100.0)
Monocytes Absolute: 1 10*3/uL (ref 0.1–1.0)
Monocytes Relative: 6 %
Neutro Abs: 14.5 10*3/uL — ABNORMAL HIGH (ref 1.7–7.7)
Neutrophils Relative %: 82 %
Platelets: 420 10*3/uL — ABNORMAL HIGH (ref 150–400)
RBC: 5.21 MIL/uL (ref 4.22–5.81)
RDW: 15.9 % — ABNORMAL HIGH (ref 11.5–15.5)
WBC: 17.5 10*3/uL — ABNORMAL HIGH (ref 4.0–10.5)
nRBC: 0 % (ref 0.0–0.2)

## 2022-09-21 LAB — URINALYSIS, ROUTINE W REFLEX MICROSCOPIC
Bilirubin Urine: NEGATIVE
Glucose, UA: NEGATIVE mg/dL
Hgb urine dipstick: NEGATIVE
Ketones, ur: NEGATIVE mg/dL
Leukocytes,Ua: NEGATIVE
Nitrite: NEGATIVE
Protein, ur: NEGATIVE mg/dL
Specific Gravity, Urine: 1.01 (ref 1.005–1.030)
pH: 5 (ref 5.0–8.0)

## 2022-09-21 LAB — BLOOD GAS, VENOUS
Acid-base deficit: 0.8 mmol/L (ref 0.0–2.0)
Bicarbonate: 25.4 mmol/L (ref 20.0–28.0)
O2 Saturation: 59.6 %
Patient temperature: 37
pCO2, Ven: 47 mmHg (ref 44–60)
pH, Ven: 7.34 (ref 7.25–7.43)
pO2, Ven: 32 mmHg (ref 32–45)

## 2022-09-21 LAB — COMPREHENSIVE METABOLIC PANEL
ALT: 16 U/L (ref 0–44)
AST: 14 U/L — ABNORMAL LOW (ref 15–41)
Albumin: 2.8 g/dL — ABNORMAL LOW (ref 3.5–5.0)
Alkaline Phosphatase: 55 U/L (ref 38–126)
Anion gap: 7 (ref 5–15)
BUN: 18 mg/dL (ref 6–20)
CO2: 23 mmol/L (ref 22–32)
Calcium: 7.9 mg/dL — ABNORMAL LOW (ref 8.9–10.3)
Chloride: 107 mmol/L (ref 98–111)
Creatinine, Ser: 1.29 mg/dL — ABNORMAL HIGH (ref 0.61–1.24)
GFR, Estimated: >60 mL/min (ref >60)
Glucose, Bld: 146 mg/dL — ABNORMAL HIGH (ref 70–99)
Potassium: 4 mmol/L (ref 3.5–5.1)
Sodium: 137 mmol/L (ref 135–145)
Total Bilirubin: 0.6 mg/dL (ref 0.3–1.2)
Total Protein: 5.1 g/dL — ABNORMAL LOW (ref 6.5–8.1)

## 2022-09-21 LAB — RAPID URINE DRUG SCREEN, HOSP PERFORMED
Amphetamines: NOT DETECTED
Barbiturates: NOT DETECTED
Benzodiazepines: NOT DETECTED
Cocaine: POSITIVE — AB
Opiates: NOT DETECTED
Tetrahydrocannabinol: POSITIVE — AB

## 2022-09-21 LAB — TROPONIN I (HIGH SENSITIVITY)
Troponin I (High Sensitivity): 5 ng/L (ref <18)
Troponin I (High Sensitivity): 6 ng/L (ref <18)

## 2022-09-21 LAB — LIPASE, BLOOD: Lipase: 32 U/L (ref 11–51)

## 2022-09-21 MED ORDER — LACTATED RINGERS IV BOLUS
1000.0000 mL | Freq: Once | INTRAVENOUS | Status: AC
  Administered 2022-09-21: 1000 mL via INTRAVENOUS

## 2022-09-21 NOTE — ED Provider Notes (Signed)
EMERGENCY DEPARTMENT AT [facility] Provider Note   CSN: [id number] Arrival date & time: 09/21/22  1505     History Chief Complaint  Patient presents with   Hypotension    HPI Jeffery Wheeler is a 57 y.o. male presenting for for syncopal episode.  He is a 57 year old male with an extensive history including substance use disorder, multiple other comorbid medical problems.  States that he donated plasma this morning went to the gas station, smoked a joint and then last thing he knew he woke up on the ground. States he did not drink any water after his plasma donation and states that he has a history of similar feelings after donation. Denies fevers chills nausea vomiting chest pain or abdominal pain at this time. No known sick contacts otherwise.  Patient's recorded medical, surgical, social, medication list and allergies were reviewed in the Snapshot window as part of the initial history.   Review of Systems   Review of Systems  Constitutional:  Negative for chills and fever.  HENT:  Negative for ear pain and sore throat.   Eyes:  Negative for pain and visual disturbance.  Respiratory:  Negative for cough and shortness of breath.   Cardiovascular:  Negative for chest pain and palpitations.  Gastrointestinal:  Negative for abdominal pain and vomiting.  Genitourinary:  Negative for dysuria and hematuria.  Musculoskeletal:  Negative for arthralgias and back pain.  Skin:  Negative for color change and rash.  Neurological:  Positive for syncope. Negative for seizures.  All other systems reviewed and are negative.   Physical Exam Updated Vital Signs BP (!) 138/95   Pulse 82   Temp (!) 97.5 F (36.4 C)   Resp 15   Ht 5\' 7"  (1.702 m)   Wt 87.1 kg   SpO2 96%   BMI 30.07 kg/m  Physical Exam Vitals and nursing note reviewed.  Constitutional:      General: He is not in acute distress.    Appearance: He is well-developed.  HENT:     Head:  Normocephalic and atraumatic.  Eyes:     Conjunctiva/sclera: Conjunctivae normal.  Cardiovascular:     Rate and Rhythm: Normal rate and regular rhythm.     Heart sounds: No murmur heard. Pulmonary:     Effort: Pulmonary effort is normal. No respiratory distress.     Breath sounds: Normal breath sounds.  Abdominal:     Palpations: Abdomen is soft.     Tenderness: There is no abdominal tenderness.  Musculoskeletal:        General: No swelling.     Cervical back: Neck supple.  Skin:    General: Skin is warm and dry.     Capillary Refill: Capillary refill takes less than 2 seconds.  Neurological:     Mental Status: He is alert.  Psychiatric:        Mood and Affect: Mood normal.      ED Course/ Medical Decision Making/ A&P    Procedures Procedures   Medications Ordered in ED Medications  lactated ringers bolus 1,000 mL (0 mLs Intravenous Stopped 09/21/22 1716)   Medical Decision Making:   Jeffery Wheeler is a 57 y.o. male who presented to the ED today with a syncopal episode detailed above.    Complete initial physical exam performed, notably the patient  was .    Reviewed and confirmed nursing documentation for past medical history, family history, social history.    Initial Assessment:  With the patient's presentation of syncope, most likely diagnosis is orthostatic hypotension vs vasovagal episode. Other diagnoses were considered including (but not limited to) arrythmogenic syncope, valvular abnormality, PE, aortic dissection. These are considered less likely due to history of present illness and physical exam findings.   This is most consistent with an acute life/limb threatening illness complicated by underlying chronic conditions. In particular, concerning cardiac etiology, this is less likely to be the etiology given the lack of chest pain, lack of serious comorbidities including heart failure or CAD.   Additionally, patient's history appears more consistent with  benign episodes including orthostatic event vs  vagal episode based on prodrome and imrpovement with IVF. Initial Plan:   Screening labs including CBC and Metabolic panel to evaluate for infectious or metabolic etiology of disease.  Urinalysis with reflex culture ordered to evaluate for UTI or relevant urologic/nephrologic pathology.  CXR to evaluate for structural/infectious intrathoracic pathology.  EKG to evaluate for cardiac pathology. Utilization of FAINT scoring detailed above.  Objective evaluation as below reviewed after administration of IVF/Telemetry monitoring  Initial Study Results:   Laboratory  All laboratory results reviewed without evidence of clinically relevant pathology.   EKG EKG was reviewed independently. Rate, rhythm, axis, intervals all examined and without medically relevant abnormality. ST segments without concerns for elevations.    Radiology:  All images reviewed independently. Agree with radiology report at this time.   DG Chest Portable 1 View  Result Date: 09/21/2022 CLINICAL DATA:  Syncope. EXAM: PORTABLE CHEST 1 VIEW COMPARISON:  09/11/2022 CT.  Most recent chest radiograph 09/11/2022 FINDINGS: Numerous leads and wires project over the chest. Cervical spine fixation. Midline trachea. Borderline cardiomegaly. No pleural effusion or pneumothorax. No congestive failure. Clear lungs. IMPRESSION: Borderline cardiomegaly, without acute disease. Electronically Signed   By: Jeronimo Greaves M.D.   On: 09/21/2022 16:50   CT Angio Chest PE W and/or Wo Contrast  Result Date: 09/11/2022 CLINICAL DATA:  Chest pain. EXAM: CT ANGIOGRAPHY CHEST WITH CONTRAST TECHNIQUE: Multidetector CT imaging of the chest was performed using the standard protocol during bolus administration of intravenous contrast. Multiplanar CT image reconstructions and MIPs were obtained to evaluate the vascular anatomy. RADIATION DOSE REDUCTION: This exam was performed according to the departmental  dose-optimization program which includes automated exposure control, adjustment of the mA and/or kV according to patient size and/or use of iterative reconstruction technique. CONTRAST:  75mL OMNIPAQUE IOHEXOL 350 MG/ML SOLN COMPARISON:  None Available. FINDINGS: Cardiovascular: Satisfactory opacification of the pulmonary arteries to the segmental level. No evidence of pulmonary embolism. Normal heart size. No pericardial effusion. Mediastinum/Nodes: No enlarged mediastinal, hilar, or axillary lymph nodes. Thyroid gland, trachea, and esophagus demonstrate no significant findings. Lungs/Pleura: Lungs are clear. No pleural effusion or pneumothorax. Upper Abdomen: 1.8 cm left adrenal nodule is noted with average Hounsfield measurement of 16. Musculoskeletal: No chest wall abnormality. No acute or significant osseous findings. Review of the MIP images confirms the above findings. IMPRESSION: No definite evidence of pulmonary embolus. No acute abnormality seen in the chest. 1.8 cm left adrenal nodule is noted. Follow-up CT or MRI in 12 months is recommended to ensure stability. Electronically Signed   By: Lupita Raider M.D.   On: 09/11/2022 09:42   DG Hip Unilat W or Wo Pelvis 2-3 Views Right  Result Date: 09/11/2022 CLINICAL DATA:  Fall with pain. EXAM: DG HIP (WITH OR WITHOUT PELVIS) 3V RIGHT COMPARISON:  None Available. FINDINGS: There is no evidence of hip fracture or dislocation. Degenerative  spurring with subchondral cystic change affecting the right more than left hip. Mild right hip joint narrowing. Clips over the left lower quadrant which could be for hemostasis. IMPRESSION: No acute finding. Right hip osteoarthritis. Electronically Signed   By: [name] M.D.   On: 09/11/2022 05:42   DG Ankle 2 Views Right  Result Date: 09/11/2022 CLINICAL DATA:  Status post fall. EXAM: RIGHT ANKLE - 2 VIEW COMPARISON:  None Available. FINDINGS: There is no evidence of fracture, dislocation, or joint effusion.  There is no evidence of arthropathy or other focal bone abnormality. Soft tissues are unremarkable. IMPRESSION: Negative. Electronically Signed   By: Signa Kell M.D.   On: 09/11/2022 05:41   DG Hand 2 View Right  Result Date: 09/11/2022 CLINICAL DATA:  Fall. EXAM: RIGHT HAND - 2 VIEW COMPARISON:  None Available. FINDINGS: There is no evidence of fracture or dislocation. There is no evidence of arthropathy or other focal bone abnormality. Soft tissues are unremarkable. IMPRESSION: Negative. Electronically Signed   By: [name] M.D.   On: 09/11/2022 05:41   DG Foot 2 Views Right  Result Date: 09/11/2022 CLINICAL DATA:  Fall. EXAM: RIGHT FOOT - 2 VIEW COMPARISON:  None Available. FINDINGS: There is no evidence of fracture or dislocation. Tiny heel spur. Soft tissues are unremarkable. IMPRESSION: Negative. Electronically Signed   By: [name] M.D.   On: 09/11/2022 05:41   DG Wrist 2 Views Right  Result Date: 09/11/2022 CLINICAL DATA:  Fall. EXAM: RIGHT WRIST - 2 VIEW COMPARISON:  None Available. FINDINGS: There is no evidence of fracture or dislocation. There is no evidence of arthropathy or other focal bone abnormality. Soft tissues are unremarkable. IMPRESSION: Negative. Electronically Signed   By: [name] M.D.   On: 09/11/2022 05:40   DG Chest Port 1 View  Result Date: 09/11/2022 CLINICAL DATA:  Trauma EXAM: PORTABLE CHEST 1 VIEW COMPARISON:  03/05/2022 FINDINGS: Heart size is normal. No pleural fluid or airspace disease. No signs of interstitial edema. Visualized osseous structures are unremarkable. IMPRESSION: No acute abnormality. Electronically Signed   By: Signa Kell M.D.   On: 09/11/2022 05:37   CT HEAD WO CONTRAST  Result Date: 09/11/2022 CLINICAL DATA:  Trauma.  Status post fall. EXAM: CT HEAD WITHOUT CONTRAST CT CERVICAL SPINE WITHOUT CONTRAST TECHNIQUE: Multidetector CT imaging of the head and cervical spine was performed following the standard protocol without  intravenous contrast. Multiplanar CT image reconstructions of the cervical spine were also generated. RADIATION DOSE REDUCTION: This exam was performed according to the departmental dose-optimization program which includes automated exposure control, adjustment of the mA and/or kV according to patient size and/or use of iterative reconstruction technique. COMPARISON:  None Available. FINDINGS: CT HEAD FINDINGS Brain: No evidence of acute infarction, hemorrhage, hydrocephalus, extra-axial collection or mass lesion/mass effect. Vascular: No hyperdense vessel or unexpected calcification. Skull: Normal. Negative for fracture or focal lesion. Sinuses/Orbits: Mild mucosal thickening is noted involving the maxillary sinuses, frontal sinus and ethmoid air cells. Opacification of the right mastoid air cells. Other: None. CT CERVICAL SPINE FINDINGS Alignment: Normal. Skull base and vertebrae: No acute fracture. No primary bone lesion or focal pathologic process. Soft tissues and spinal canal: No prevertebral fluid or swelling. No visible canal hematoma. Disc levels: Status post ACDF at C4 through C7 with solid fusion of the C5-6 and C6-7 disc spaces. Upper chest: Negative. Other: None IMPRESSION: 1. No acute intracranial abnormality seen. 2. Status post ACDF at C4 through C7 with solid fusion  of the C5-6 and C6-7 disc spaces. 3. No acute abnormality seen in the cervical spine. Electronically Signed   By: Signa Kell M.D.   On: 09/11/2022 05:34   CT CERVICAL SPINE WO CONTRAST  Result Date: 09/11/2022 CLINICAL DATA:  Trauma.  Status post fall. EXAM: CT HEAD WITHOUT CONTRAST CT CERVICAL SPINE WITHOUT CONTRAST TECHNIQUE: Multidetector CT imaging of the head and cervical spine was performed following the standard protocol without intravenous contrast. Multiplanar CT image reconstructions of the cervical spine were also generated. RADIATION DOSE REDUCTION: This exam was performed according to the departmental  dose-optimization program which includes automated exposure control, adjustment of the mA and/or kV according to patient size and/or use of iterative reconstruction technique. COMPARISON:  None Available. FINDINGS: CT HEAD FINDINGS Brain: No evidence of acute infarction, hemorrhage, hydrocephalus, extra-axial collection or mass lesion/mass effect. Vascular: No hyperdense vessel or unexpected calcification. Skull: Normal. Negative for fracture or focal lesion. Sinuses/Orbits: Mild mucosal thickening is noted involving the maxillary sinuses, frontal sinus and ethmoid air cells. Opacification of the right mastoid air cells. Other: None. CT CERVICAL SPINE FINDINGS Alignment: Normal. Skull base and vertebrae: No acute fracture. No primary bone lesion or focal pathologic process. Soft tissues and spinal canal: No prevertebral fluid or swelling. No visible canal hematoma. Disc levels: Status post ACDF at C4 through C7 with solid fusion of the C5-6 and C6-7 disc spaces. Upper chest: Negative. Other: None IMPRESSION: 1. No acute intracranial abnormality seen. 2. Status post ACDF at C4 through C7 with solid fusion of the C5-6 and C6-7 disc spaces. 3. No acute abnormality seen in the cervical spine. Electronically Signed   By: Signa Kell M.D.   On: 09/11/2022 05:34   DG Ankle Complete Left  Result Date: 09/04/2022 CLINICAL DATA:  Twisting injury, swelling and pain EXAM: LEFT ANKLE COMPLETE - 3+ VIEW COMPARISON:  None Available. FINDINGS: There is no evidence of fracture, dislocation, or joint effusion. There is no evidence of arthropathy or other focal bone abnormality. Soft tissues are unremarkable. IMPRESSION: Negative. Electronically Signed   By: Judie Petit.  Shick M.D.   On: 09/04/2022 14:37      Final Assessment and Plan:   I was called back to patient's bedside.  He has stated that all of his symptoms have resolved and he wants to be discharged immediately. Objective findings with no focal pathology today.  Discussed  risk of syncope including cardiac underlying pathology and patient expressed understanding was still wishes to be discharged immediately to follow-up in the outpatient setting with his normal doctor.  States that he thinks he was just dehydrated and now feels fully resolved.  Has been ambulatory tolerating p.o. intake and is in no acute distress at this time. Disposition:  Patient is requesting discharge at this time.  Given patient's understanding of risk of severe missed diagnosis based on limitations of today's evaluation and risk of interval worsening of disease including life or limb threatening pathology, will participate in shared medical decision making and patient directed discharge at this time.  Patient is welcome to return for further diagnostic evaluation/therapeutic management at any time.  Emergency Department Medication Summary:   Medications  lactated ringers bolus 1,000 mL (0 mLs Intravenous Stopped 09/21/22 1716)          Clinical Impression:  1. Dehydration      Discharge   Clinical Impression:  1. Dehydration      Discharge   Final Clinical Impression(s) / ED Diagnoses Final diagnoses:  Dehydration  Rx / DC Orders ED Discharge Orders     None         Glyn Ade, MD 09/21/22 2137

## 2022-09-21 NOTE — ED Triage Notes (Signed)
[place] Per [place] patient gave plasma today [place] and had a syncopal episode. EMS gave 1,000 of NS BP was 62/50 and after fluids BP increased to 84/60.  62/50 CBG: 164 18g L AC

## 2022-10-08 ENCOUNTER — Ambulatory Visit: Payer: Medicaid Other | Attending: Audiologist | Admitting: Audiologist

## 2022-10-14 ENCOUNTER — Inpatient Hospital Stay: Payer: Medicaid Other

## 2022-10-14 ENCOUNTER — Inpatient Hospital Stay: Payer: Medicaid Other | Attending: Hematology and Oncology | Admitting: Hematology and Oncology

## 2022-11-02 ENCOUNTER — Emergency Department (HOSPITAL_COMMUNITY)
Admission: EM | Admit: 2022-11-02 | Discharge: 2022-11-02 | Disposition: A | Discharge status: Home / Self Care | Payer: Medicaid Other | Attending: Emergency Medicine | Admitting: Emergency Medicine

## 2022-11-02 ENCOUNTER — Other Ambulatory Visit: Payer: Self-pay

## 2022-11-02 ENCOUNTER — Emergency Department (HOSPITAL_COMMUNITY): Payer: Medicaid Other

## 2022-11-02 ENCOUNTER — Encounter (HOSPITAL_COMMUNITY): Payer: Self-pay | Admitting: *Deleted

## 2022-11-02 DIAGNOSIS — M25512 Pain in left shoulder: Secondary | ICD-10-CM | POA: Diagnosis present

## 2022-11-02 DIAGNOSIS — Z91040 Latex allergy status: Secondary | ICD-10-CM | POA: Insufficient documentation

## 2022-11-02 MED ORDER — OXYCODONE-ACETAMINOPHEN 5-325 MG PO TABS
1.0000 | ORAL_TABLET | Freq: Once | ORAL | Status: AC
  Administered 2022-11-02: 1 via ORAL
  Filled 2022-11-02: qty 1

## 2022-11-02 NOTE — ED Provider Notes (Signed)
[place] EMERGENCY DEPARTMENT AT [facility] Provider Note   CSN: [id number] Arrival date & time: 11/02/22  0840     History  Chief Complaint  Patient presents with   Shoulder Pain    Jeffery Wheeler is a 57 y.o. male who presents emergency department with concerns for left shoulder pain x 2 weeks.  Denies recent fall, injury, trauma, heavy lifting.  Notes that he has been evaluated before for a pinched nerve and shoulder pain to his right shoulder where he had to have surgery to his right shoulder.  Has been followed by his primary care provider at [facility] and being treated with gabapentin.  He denies having a back specialist at this time.  Notes that he has had numbness to both left arm x 2 weeks.  Denies fever, back pain.  The history is provided by the patient. No language interpreter was used.       Home Medications Prior to Admission medications   Medication Sig Start Date End Date Taking? Authorizing Provider  amLODipine (NORVASC) 10 MG tablet Take 1 tablet (10 mg total) by mouth daily. 02/27/22   Sarita Bottom, MD  ARIPiprazole (ABILIFY) 10 MG tablet Take 1 tablet (10 mg total) by mouth daily. 02/28/22   Sarita Bottom, MD  atorvastatin (LIPITOR) 20 MG tablet Take 1 tablet (20 mg total) by mouth daily. 02/27/22   Sarita Bottom, MD  cyclobenzaprine (FLEXERIL) 10 MG tablet Take 1 tablet (10 mg total) by mouth 2 (two) times daily as needed (As needed for ankle and leg pain). 09/04/22   Bing Neighbors, NP  gabapentin (NEURONTIN) 300 MG capsule Take 1 capsule (300 mg total) by mouth 3 (three) times daily. 02/27/22   Sarita Bottom, MD  metFORMIN (GLUCOPHAGE) 850 MG tablet Take 1 tablet (850 mg total) by mouth 2 (two) times daily with a meal. 02/27/22   Abbott Pao, Nadir, MD  sertraline (ZOLOFT) 100 MG tablet Take 1 tablet (100 mg total) by mouth daily. 02/28/22   Sarita Bottom, MD  traZODone (DESYREL) 100 MG tablet Take 1 tablet (100 mg total) by mouth at  bedtime. Patient not taking: Reported on 09/21/2022 02/27/22   Sarita Bottom, MD  triamcinolone ointment (KENALOG) 0.1 % Apply topically 2 (two) times daily. Patient not taking: Reported on 09/21/2022 02/27/22   Sarita Bottom, MD      Allergies    Latex and Shellfish-derived products    Review of Systems   Review of Systems  All other systems reviewed and are negative.   Physical Exam Updated Vital Signs BP 120/77 (BP Location: Right Arm)   Pulse 88   Temp 98.9 F (37.2 C) (Oral)   Resp 18   Ht 5\' 7"  (1.702 m)   Wt 85.7 kg   SpO2 99%   BMI 29.60 kg/m  Physical Exam Vitals and nursing note reviewed.  Constitutional:      General: He is not in acute distress.    Appearance: Normal appearance.  Eyes:     General: No scleral icterus.    Extraocular Movements: Extraocular movements intact.  Cardiovascular:     Rate and Rhythm: Normal rate.  Pulmonary:     Effort: Pulmonary effort is normal. No respiratory distress.  Abdominal:     Palpations: Abdomen is soft. There is no mass.     Tenderness: There is no abdominal tenderness.  Musculoskeletal:        General: Normal range of motion.     Cervical back: Neck  supple.     Comments: No spinal tenderness to palpation.  Tenderness to palpation noted to left upper thoracic musculature.  Tenderness to palpation noted to left anterior shoulder.  No tenderness to palpation noted to left clavicle, left olecranon, left humerus, left forearm, left hand.  No tenderness to palpation noted with supination and pronation.  Olecranon.  Decreased range of motion of left shoulder secondary to pain.  No overlying skin changes.  No erythema noted.  Decreased grip strength on left secondary to pain.  Negative pronator drift.  Sensation intact to bilateral upper extremities.  Skin:    General: Skin is warm and dry.     Findings: No rash.  Neurological:     Mental Status: He is alert.     Sensory: Sensation is intact.     Motor: Motor function is  intact.  Psychiatric:        Behavior: Behavior normal.     ED Results / Procedures / Treatments   Labs (all labs ordered are listed, but only abnormal results are displayed) Labs Reviewed - No data to display  EKG None  Radiology CT Cervical Spine Wo Contrast  Result Date: 11/02/2022 CLINICAL DATA:  57 year old male with neck pain.  Acute pain. EXAM: CT CERVICAL SPINE WITHOUT CONTRAST TECHNIQUE: Multidetector CT imaging of the cervical spine was performed without intravenous contrast. Multiplanar CT image reconstructions were also generated. RADIATION DOSE REDUCTION: This exam was performed according to the departmental dose-optimization program which includes automated exposure control, adjustment of the mA and/or kV according to patient size and/or use of iterative reconstruction technique. COMPARISON:  Cervical spine CT 09/11/2022. FINDINGS: Alignment: Mild reversal of upper cervical lordosis today. Cervicothoracic junction alignment is within normal limits. Posterior elements remain aligned. Skull base and vertebrae: Visualized skull base is intact. No atlanto-occipital dissociation. Postoperative details are below. No acute osseous abnormality identified. Soft tissues and spinal canal: No prevertebral fluid or swelling. No visible canal hematoma. Disc levels: C2-C3 and C3-C4 degeneration appears stable since last month. Up to mild spinal stenosis related to disc bulging and ligament flavum hypertrophy at those levels. C4-C5 ACDF but no convincing arthrodesis and subtle lucency about the C4 cortical screws (series 8, images 42 and 43). C5-C6 ACDF with solid arthrodesis. C6-C7 ACDF with solid arthrodesis. C7-T1 mild adjacent segment disease. Upper chest: Negative. Other: Chronic right middle ear and mastoid opacification. Visible noncontrast brain parenchyma is negative for age. Calcified atherosclerosis at the skull base. IMPRESSION: 1. 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 2. C5-C6 and C6-C7 ACDF with solid arthrodesis. 3. Adjacent segment disease with up to mild spinal stenosis appears stable since last month. 4. No acute osseous abnormality identified in the cervical spine. 5. 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 Electronically Signed   By: Odessa Fleming M.D.   On: 11/02/2022 10:16   DG Shoulder Left  Result Date: 11/02/2022 CLINICAL DATA:  Left shoulder pain 2 weeks. EXAM: LEFT SHOULDER - 2+ VIEW COMPARISON:  None Available. FINDINGS: Mild degenerate change of the [facility] joint and minimally involving the glenohumeral joint. No fracture or dislocation. No focal lytic or sclerotic lesion. IMPRESSION: 1. No acute findings. 2. Mild degenerative changes. Electronically Signed   By: Elberta Fortis M.D.   On: 11/02/2022 09:24    Procedures Procedures    Medications Ordered in ED Medications  oxyCODONE-acetaminophen (PERCOCET/ROXICET) 5-325 MG per tablet 1 tablet (1 tablet Oral Given 11/02/22 1610)    ED Course/  Medical Decision Making/ A&P Clinical Course as of 11/02/22 1448  Sat Nov 02, 2022  0947 Pt re-evaluated and resting comfortably on stretcher. Discussed with patient xray imaging findings. Offered CT of neck, patient agreeable at this time. Discussed with patient that if there are no acute findings, that patient will need to follow up with his care team. Discussed with patient treatment plan. Answered all available questions.  [SB]  1034 Re-evaluated and noted improvement of symptoms with treatment regimen. Discussed discharge treatment plan. Pt agreeable at this time. Pt appears safe for discharge. [SB]    Clinical Course User Index [SB] Jadie Allington A, PA-C                             Medical Decision Making Amount and/or Complexity of Data Reviewed Radiology: ordered.  Risk Prescription drug management.   Patient with left shoulder pain onset 2 weeks.  Also notes neck pain.  Patient afebrile.  On exam patient  with No spinal tenderness to palpation.  Tenderness to palpation noted to left upper thoracic musculature.  Tenderness to palpation noted to left anterior shoulder.  No tenderness to palpation noted to left clavicle, left olecranon, left humerus, left forearm, left hand.  No tenderness to palpation noted with supination and pronation.  Olecranon.  Decreased range of motion of left shoulder secondary to pain.  No overlying skin changes.  No erythema noted.  Decreased grip strength on left secondary to pain.  Negative pronator drift.  Sensation intact to bilateral upper extremities. Differential diagnosis includes fracture, dislocation, herniation, rotator cuff pathology, strain/sprain.   Imaging: I ordered imaging studies including left shoulder xray, CT cervical spine I independently visualized and interpreted imaging which showed:  1. No acute findings.  2. Mild degenerative changes.  CT cervical with  1. 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  2. C5-C6 and C6-C7 ACDF with solid arthrodesis.  3. Adjacent segment disease with up to mild spinal stenosis appears  stable since last month.  4. No acute osseous abnormality identified in the cervical spine.  5. 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   I agree with the radiologist interpretation  Medications:  I ordered medication including Percocet for pain management  Reevaluation of the patient after these medicines and interventions, I reevaluated the patient and found that they have improved I have reviewed the patients home medicines and have made adjustments as needed   Disposition: Presentation suspicious for left shoulder pain.  Doubt concerns at this time for fracture, dislocation, herniation.  Instructed patient to follow-up with his primary care provider as well as his previous surgeon for his neck. After consideration of the diagnostic results and the patients response to  treatment, I feel that the patient would benefit from Discharge home.  Offered work note, patient declined at this time.  Patient provided with information for orthopedics for follow-up regarding today's ED visit.  Stressed importance of patient maintaining follow-up with orthopedist.  Supportive care measures and strict return precautions discussed with patient at bedside. Pt acknowledges and verbalizes understanding. Pt appears safe for discharge. Follow up as indicated in discharge paperwork.    This chart was dictated using voice recognition software, Dragon. Despite the best efforts of this provider to proofread and correct errors, errors may still occur which can change documentation meaning.  Final Clinical Impression(s) / ED Diagnoses Final diagnoses:  Acute pain of left shoulder  Rx / DC Orders ED Discharge Orders     None         Angelic Schnelle A, PA-C 11/02/22 1448    Terald Sleeper, MD 11/02/22 2010

## 2022-11-02 NOTE — ED Triage Notes (Signed)
Patient c/o left shoulder pain radiating into the left scapula, onset several weeks ago. States he has had surgery on his neck in the past. No heavy lifting.

## 2022-11-02 NOTE — Discharge Instructions (Addendum)
It was a pleasure taking care of you!   Your CT of your neck didn't show any emergent findings at this time. It did show some loosening of the screws that were placed for your surgery. Your xray of your shoulder was negative for fracture or dislocation.  You may take over the counter 600 mg Ibuprofen every 6 hours and alternate with 500 mg Tylenol every 6 hours as needed for pain for no more than 7 days. You will be given a sling today. Wear it during the day, you may remove it at night. Do not wear the sling for longer than 48 hours consecutively. Attached is information for the Orthopedist, call and set up a follow up appointment. You may apply ice or heat to affected area for up to 15 minutes at a time. Ensure to place a barrier between your skin and the ice/heat.  You may follow-up with your primary care provider as needed.  Return to the Emergency Department if you are experiencing increasing/worsening symptoms.

## 2022-11-09 ENCOUNTER — Emergency Department (HOSPITAL_COMMUNITY): Payer: MEDICAID

## 2022-11-09 ENCOUNTER — Emergency Department (HOSPITAL_COMMUNITY)
Admission: EM | Admit: 2022-11-09 | Discharge: 2022-11-09 | Disposition: A | Discharge status: Home / Self Care | Payer: MEDICAID | Attending: Emergency Medicine | Admitting: Emergency Medicine

## 2022-11-09 ENCOUNTER — Encounter (HOSPITAL_COMMUNITY): Payer: Self-pay | Admitting: Emergency Medicine

## 2022-11-09 ENCOUNTER — Other Ambulatory Visit: Payer: Self-pay

## 2022-11-09 DIAGNOSIS — G8929 Other chronic pain: Secondary | ICD-10-CM | POA: Insufficient documentation

## 2022-11-09 DIAGNOSIS — E860 Dehydration: Secondary | ICD-10-CM | POA: Diagnosis not present

## 2022-11-09 DIAGNOSIS — F1490 Cocaine use, unspecified, uncomplicated: Secondary | ICD-10-CM | POA: Insufficient documentation

## 2022-11-09 DIAGNOSIS — M542 Cervicalgia: Secondary | ICD-10-CM | POA: Insufficient documentation

## 2022-11-09 DIAGNOSIS — R059 Cough, unspecified: Secondary | ICD-10-CM | POA: Diagnosis not present

## 2022-11-09 DIAGNOSIS — F1990 Other psychoactive substance use, unspecified, uncomplicated: Secondary | ICD-10-CM

## 2022-11-09 DIAGNOSIS — F1290 Cannabis use, unspecified, uncomplicated: Secondary | ICD-10-CM | POA: Diagnosis not present

## 2022-11-09 DIAGNOSIS — E119 Type 2 diabetes mellitus without complications: Secondary | ICD-10-CM | POA: Diagnosis not present

## 2022-11-09 DIAGNOSIS — M25519 Pain in unspecified shoulder: Secondary | ICD-10-CM | POA: Insufficient documentation

## 2022-11-09 DIAGNOSIS — R45851 Suicidal ideations: Secondary | ICD-10-CM | POA: Diagnosis not present

## 2022-11-09 DIAGNOSIS — F332 Major depressive disorder, recurrent severe without psychotic features: Secondary | ICD-10-CM | POA: Insufficient documentation

## 2022-11-09 DIAGNOSIS — Z1152 Encounter for screening for COVID-19: Secondary | ICD-10-CM | POA: Diagnosis not present

## 2022-11-09 DIAGNOSIS — Z7984 Long term (current) use of oral hypoglycemic drugs: Secondary | ICD-10-CM | POA: Diagnosis not present

## 2022-11-09 DIAGNOSIS — D72829 Elevated white blood cell count, unspecified: Secondary | ICD-10-CM | POA: Diagnosis not present

## 2022-11-09 DIAGNOSIS — I10 Essential (primary) hypertension: Secondary | ICD-10-CM | POA: Diagnosis not present

## 2022-11-09 DIAGNOSIS — Z91040 Latex allergy status: Secondary | ICD-10-CM | POA: Insufficient documentation

## 2022-11-09 DIAGNOSIS — Z79899 Other long term (current) drug therapy: Secondary | ICD-10-CM | POA: Insufficient documentation

## 2022-11-09 LAB — CBC
HCT: 47.9 % (ref 39.0–52.0)
Hemoglobin: 15.4 g/dL (ref 13.0–17.0)
MCH: 27.1 pg (ref 26.0–34.0)
MCHC: 32.2 g/dL (ref 30.0–36.0)
MCV: 84.2 fL (ref 80.0–100.0)
Platelets: 448 10*3/uL — ABNORMAL HIGH (ref 150–400)
RBC: 5.69 MIL/uL (ref 4.22–5.81)
RDW: 16.7 % — ABNORMAL HIGH (ref 11.5–15.5)
WBC: 32.9 10*3/uL — ABNORMAL HIGH (ref 4.0–10.5)
nRBC: 0 % (ref 0.0–0.2)

## 2022-11-09 LAB — COMPREHENSIVE METABOLIC PANEL
ALT: 28 U/L (ref 0–44)
AST: 20 U/L (ref 15–41)
Albumin: 3.8 g/dL (ref 3.5–5.0)
Alkaline Phosphatase: 68 U/L (ref 38–126)
Anion gap: 16 — ABNORMAL HIGH (ref 5–15)
BUN: 21 mg/dL — ABNORMAL HIGH (ref 6–20)
CO2: 21 mmol/L — ABNORMAL LOW (ref 22–32)
Calcium: 9.5 mg/dL (ref 8.9–10.3)
Chloride: 103 mmol/L (ref 98–111)
Creatinine, Ser: 2.02 mg/dL — ABNORMAL HIGH (ref 0.61–1.24)
GFR, Estimated: 38 mL/min — ABNORMAL LOW (ref >60)
Glucose, Bld: 150 mg/dL — ABNORMAL HIGH (ref 70–99)
Potassium: 3.4 mmol/L — ABNORMAL LOW (ref 3.5–5.1)
Sodium: 140 mmol/L (ref 135–145)
Total Bilirubin: 0.9 mg/dL (ref 0.3–1.2)
Total Protein: 6.4 g/dL — ABNORMAL LOW (ref 6.5–8.1)

## 2022-11-09 LAB — URINALYSIS, ROUTINE W REFLEX MICROSCOPIC
Bacteria, UA: NONE SEEN
Glucose, UA: NEGATIVE mg/dL
Hgb urine dipstick: NEGATIVE
Ketones, ur: NEGATIVE mg/dL
Leukocytes,Ua: NEGATIVE
Nitrite: NEGATIVE
Protein, ur: 30 mg/dL — AB
Specific Gravity, Urine: 1.03 (ref 1.005–1.030)
pH: 5 (ref 5.0–8.0)

## 2022-11-09 LAB — I-STAT CHEM 8, ED
BUN: 21 mg/dL — ABNORMAL HIGH (ref 6–20)
BUN: 21 mg/dL — ABNORMAL HIGH (ref 6–20)
Calcium, Ion: 1.04 mmol/L — ABNORMAL LOW (ref 1.15–1.40)
Calcium, Ion: 1.06 mmol/L — ABNORMAL LOW (ref 1.15–1.40)
Chloride: 106 mmol/L (ref 98–111)
Chloride: 107 mmol/L (ref 98–111)
Creatinine, Ser: 1.4 mg/dL — ABNORMAL HIGH (ref 0.61–1.24)
Creatinine, Ser: 1.5 mg/dL — ABNORMAL HIGH (ref 0.61–1.24)
Glucose, Bld: 118 mg/dL — ABNORMAL HIGH (ref 70–99)
Glucose, Bld: 121 mg/dL — ABNORMAL HIGH (ref 70–99)
HCT: 41 % (ref 39.0–52.0)
HCT: 42 % (ref 39.0–52.0)
Hemoglobin: 13.9 g/dL (ref 13.0–17.0)
Hemoglobin: 14.3 g/dL (ref 13.0–17.0)
Potassium: 3.5 mmol/L (ref 3.5–5.1)
Potassium: 3.6 mmol/L (ref 3.5–5.1)
Sodium: 140 mmol/L (ref 135–145)
Sodium: 140 mmol/L (ref 135–145)
TCO2: 22 mmol/L (ref 22–32)
TCO2: 22 mmol/L (ref 22–32)

## 2022-11-09 LAB — SARS CORONAVIRUS 2 BY RT PCR: SARS Coronavirus 2 by RT PCR: NEGATIVE

## 2022-11-09 LAB — RAPID URINE DRUG SCREEN, HOSP PERFORMED
Amphetamines: NOT DETECTED
Barbiturates: NOT DETECTED
Benzodiazepines: NOT DETECTED
Cocaine: POSITIVE — AB
Opiates: POSITIVE — AB
Tetrahydrocannabinol: POSITIVE — AB

## 2022-11-09 LAB — SALICYLATE LEVEL: Salicylate Lvl: <7 mg/dL — ABNORMAL LOW (ref 7.0–30.0)

## 2022-11-09 LAB — ACETAMINOPHEN LEVEL: Acetaminophen (Tylenol), Serum: <10 ug/mL — ABNORMAL LOW (ref 10–30)

## 2022-11-09 LAB — ETHANOL: Alcohol, Ethyl (B): <10 mg/dL (ref <10)

## 2022-11-09 LAB — CK: Total CK: 270 U/L (ref 49–397)

## 2022-11-09 MED ORDER — IBUPROFEN 400 MG PO TABS
400.0000 mg | ORAL_TABLET | Freq: Once | ORAL | Status: AC
  Administered 2022-11-09: 400 mg via ORAL
  Filled 2022-11-09: qty 1

## 2022-11-09 MED ORDER — ACETAMINOPHEN 325 MG PO TABS: 650.0000 mg | ORAL_TABLET | ORAL | Status: DC | PRN

## 2022-11-09 MED ORDER — ONDANSETRON HCL 4 MG PO TABS: 4.0000 mg | ORAL_TABLET | Freq: Three times a day (TID) | ORAL | Status: DC | PRN

## 2022-11-09 MED ORDER — DIPHENHYDRAMINE-ZINC ACETATE 2-0.1 % EX CREA
TOPICAL_CREAM | CUTANEOUS | Status: DC
  Filled 2022-11-09: qty 28

## 2022-11-09 MED ORDER — LORAZEPAM 1 MG PO TABS: 1.0000 mg | ORAL_TABLET | Freq: Four times a day (QID) | ORAL | Status: DC | PRN

## 2022-11-09 MED ORDER — SODIUM CHLORIDE 0.9 % IV BOLUS (SEPSIS)
1000.0000 mL | Freq: Once | INTRAVENOUS | Status: AC
  Administered 2022-11-09: 1000 mL via INTRAVENOUS

## 2022-11-09 NOTE — ED Notes (Signed)
Sitter at bedside.

## 2022-11-09 NOTE — ED Notes (Signed)
Pt given back all personal belongings in locker #6.  Pt signed along with this RN and put into medical records to scan into chart.  Awaiting cream for main pharmacy.

## 2022-11-09 NOTE — ED Notes (Signed)
Main pharmacy messaged about cream needed to patient in order to discharge.  Awaiting cream at this time.

## 2022-11-09 NOTE — ED Triage Notes (Signed)
Pt is homeless.  Reports feelings of "not wanting to be here anymore"  Endorses SI with no plan.  Tearful at time of triage.

## 2022-11-09 NOTE — BH Assessment (Addendum)
Comprehensive Clinical Assessment (CCA) Note   11/09/2022 Jeffery Wheeler [id number]  Disposition: Per [name], NP , pt is psych cleared and he should follow up with outpatient services.   The patient demonstrates the following risk factors for suicide: Chronic risk factors for suicide include: previous suicide attempts   . Acute risk factors for suicide include: unemployment. Protective factors for this patient include: coping skills. Considering these factors, the overall suicide risk at this point appears to be low. Patient is appropriate for outpatient follow up.    Pt is a 56 yo male who presents voluntarily to [facility] seeking treatment for his depression and suicidal ideations. Pt reports feelings of "not wanting to be here anymore". Pt reports passive SI thoughts and denies having a plan.  Pt report hx of SI attempt in 2023 when he attempted to overdose. Pt reports hx of depression and anxiety. Pt denies HI, AVH, and paranioa. Pt reports alcohol and cocaine use. Pt reports that he uses both approx. 3-4 times a week. Pt is currently not active with outpatient services. Pt reports that he currently stays at the [facility] at night.  Pt was casually dressed and groomed appropriately. Pt is alert, oriented x4 with normal speech and normal motor behavior. Eye contact is good. Pt's mood is depressed, and affect is flat. Thought process is coherent and relevant. Pt's insight is fair and judgement is poor. There is no indication pt is currently responding to internal stimuli or experiencing delusional thought content. Pt was cooperative throughout assessment.   Chief Complaint:  Chief Complaint  Patient presents with   Suicidal   Visit Diagnosis:  Major Depressive D/O recurrent, severe  Alcohol Use  Cocaine Use     CCA Screening, Triage and Referral (STR)  Patient Reported Information How did you hear about [place]? Other (Comment) ([facility])  What Is the Reason for Your Visit/Call  Today? Pt is a 57 yo male who presents voluntarily to [facility] seeking treatment for his depression and suicidal ideations. Pt reports feelings of "not wanting to be here anymore". Pt reports passive SI thoughts and denies having a plan.  Pt report hx of SI attempt in 2023 when he attempted to overdose. Pt reports hx of depression and anxiety. Pt denies HI, AVH, and paranioa. Pt reports alcohol and cocaine use. Pt reports that he uses both approx. 3-4 times a week. Pt is currently not active with outpatient services. Pt reports that he currently stays at the [facility] at night.  How Long Has This Been Causing You Problems? 1-6 months  What Do You Feel Would Help You the Most Today? Stress Management; Housing Assistance; Treatment for Depression or other mood problem; Medication(s)   Have You Recently Had Any Thoughts About Hurting Yourself? Yes  Are You Planning to Commit Suicide/Harm Yourself At This time? No   Flowsheet Row ED from 11/09/2022 in [facility] Emergency Department at [facility] ED from 11/02/2022 in [facility] Emergency Department at [facility] ED from 09/21/2022 in [facility] Emergency Department at [facility]  C-SSRS RISK CATEGORY Low Risk No Risk No Risk       Have you Recently Had Thoughts About Hurting Someone [name]? No  Are You Planning to Harm Someone at This Time? No  Explanation: n/a  Have You Used Any Alcohol or Drugs in the Past 24 Hours? Yes  What Did You Use and How Much? Alcohol : 40 oz beer  Cocaine : 1 gram  Do You Currently Have a Therapist/Psychiatrist? No  Name of Therapist/Psychiatrist: Name of Therapist/Psychiatrist: n/a   Have You Been Recently Discharged From Any Office Practice or Programs? No  Explanation of Discharge From Practice/Program: n/a     CCA Screening Triage Referral Assessment Type of Contact: Tele-Assessment  Telemedicine Service Delivery: Telemedicine service delivery: This service was  provided via telemedicine using a 2-way, interactive audio and video technology  Is this Initial or Reassessment? Is this Initial or Reassessment?: Initial Assessment  Date Telepsych consult ordered in [facility]:  Date Telepsych consult ordered in [facility]: 11/09/22  Time Telepsych consult ordered in [facility]:  Time Telepsych consult ordered in [facility]: 0455  Location of Assessment: [facility] ED  Provider Location: [facility] Assessment Services   Collateral Involvement: no collateral information available   Does Patient Have a Automotive engineer Guardian? No  Legal Guardian Contact Information: n/a  Copy of Legal Guardianship Form: -- (n/a)  Legal Guardian Notified of Arrival: -- (n/a)  Legal Guardian Notified of Pending Discharge: -- (n/a)  If Minor and Not Living with Parent(s), Who has Custody? n/a  Is CPS involved or ever been involved? Never  Is APS involved or ever been involved? Never   Patient Determined To Be At Risk for Harm To Self or Others Based on Review of Patient Reported Information or Presenting Complaint? Yes, for Self-Harm  Method: No Plan  Availability of Means: No access or NA  Intent: Vague intent or NA  Notification Required: No need or identified person  Additional Information for Danger to Others Potential: -- (n/a)  Additional Comments for Danger to Others Potential: n/a  Are There Guns or Other Weapons in Your Home? No  Types of Guns/Weapons: Pt denies access to guns/weapons  Are These Weapons Safely Secured?                            Yes  Who Could Verify You Are Able To Have These Secured: n/a  Do You Have any Outstanding Charges, Pending Court Dates, Parole/Probation? Pt denies pending legal charges.  Contacted To Inform of Risk of Harm To Self or Others: -- (n/a)    Does Patient Present under Involuntary Commitment? No    [place] of Residence: [place]   Patient Currently Receiving the Following Services: Not Receiving  Services   Determination of Need: Routine (7 days)   Options For Referral: Outpatient Therapy     CCA Biopsychosocial Patient Reported Schizophrenia/Schizoaffective Diagnosis in Past: No   Strengths: Desire to change his life   Mental Health Symptoms Depression:   Change in energy/activity; Sleep (too much or little); Worthlessness; Hopelessness   Duration of Depressive symptoms:    Mania:   None   Anxiety:    Difficulty concentrating; Sleep; Tension; Worrying; Restlessness   Psychosis:   None   Duration of Psychotic symptoms:    Trauma:   None   Obsessions:   None   Compulsions:   None   Inattention:   None   Hyperactivity/Impulsivity:   None   Oppositional/Defiant Behaviors:   None   Emotional Irregularity:   Potentially harmful impulsivity; Recurrent suicidal behaviors/gestures/threats   Other Mood/Personality Symptoms:   depressed mood and flat affect    Mental Status Exam Appearance and self-care  Stature:   Average   Weight:   Average weight   Clothing:   Neat/clean   Grooming:   Normal   Cosmetic use:   None   Posture/gait:  Normal   Motor activity:   Restless   Sensorium  Attention:   Normal   Concentration:   Anxiety interferes   Orientation:   Object; Person; Place; Situation; Time   Recall/memory:   Normal   Affect and Mood  Affect:   Flat   Mood:   Depressed; Anxious; Hopeless   Relating  Eye contact:   Normal   Facial expression:   Depressed   Attitude toward examiner:   Cooperative   Thought and Language  Speech flow:  Normal   Thought content:   Appropriate to Mood and Circumstances   Preoccupation:   Suicide   Hallucinations:   None   Organization:   Goal-directed; Logical   Company secretary of Knowledge:   Fair   Intelligence:   Average   Abstraction:   Normal   Judgement:   Fair   Dance movement psychotherapist:   Realistic   Insight:   Lacking   Decision  Making:   Impulsive   Social Functioning  Social Maturity:   Isolates   Social Judgement:   "Street Smart"   Stress  Stressors:   Grief/losses; Housing; Metallurgist; Relationship   Coping Ability:   Exhausted   Skill Deficits:   Decision making   Supports:   Family     Religion: Religion/Spirituality Are You A Religious Person?: No (not assessed) How Might This Affect Treatment?: n/a  Leisure/Recreation: Leisure / Recreation Do You Have Hobbies?: No  Exercise/Diet: Exercise/Diet Do You Exercise?: No Have You Gained or Lost A Significant Amount of Weight in the Past Six Months?: No Do You Follow a Special Diet?: No Do You Have Any Trouble Sleeping?: Yes Explanation of Sleeping Difficulties: Pt reports difficulty sleeping   CCA Employment/Education Employment/Work Situation: Employment / Work Situation Employment Situation: Unemployed Patient's Job has Been Impacted by Current Illness: No Describe how Patient's Job has Been Impacted: n/a Has Patient ever Been in the [employer]?: No  Education: Education Is Patient Currently Attending School?: No Last Grade Completed: 12 Did You Product manager?: No Did You Have An Individualized Education Program (IIEP): No Did You Have Any Difficulty At School?: No Patient's Education Has Been Impacted by Current Illness: No   CCA Family/Childhood History Family and Relationship History: Family history Marital status: Single Does patient have children?: No  Childhood History:  Childhood History By whom was/is the patient raised?: Other (Comment) (UTA) Did patient suffer any verbal/emotional/physical/sexual abuse as a child?: No Did patient suffer from severe childhood neglect?: No Has patient ever been sexually abused/assaulted/raped as an adolescent or adult?: No Was the patient ever a victim of a crime or a disaster?: No Witnessed domestic violence?: No Has patient been affected by domestic violence as an  adult?: No       CCA Substance Use Alcohol/Drug Use: Alcohol / Drug Use Pain Medications: see MAR Prescriptions: see MAR Over the Counter: see MAR History of alcohol / drug use?: Yes Longest period of sobriety (when/how long): 9 mths Negative Consequences of Use: Financial, Legal, Personal relationships, Work / School Withdrawal Symptoms: None Substance #1 Name of Substance 1: Alcohol 1 - Age of First Use: UTA 1 - Amount (size/oz): 10 40 oz beers per week 1 - Frequency: 3-4 times a week 1 - Last Use / Amount: Yesterday Substance #2 Name of Substance 2: Cocaine 2 - Age of First Use: UTA 2 - Amount (size/oz): 1 gram 2 - Frequency: 3-4 times a week 2 - Last Use / Amount: yesterday  ASAM's:  Six Dimensions of Multidimensional Assessment  Dimension 1:  Acute Intoxication and/or Withdrawal Potential:   Dimension 1:  Description of individual's past and current experiences of substance use and withdrawal: Patient is not drinking at a level that would require any detox and he is not exhibiting any withdrawal symptoms  Dimension 2:  Biomedical Conditions and Complications:   Dimension 2:  Description of patient's biomedical conditions and  complications: Patient has no current medical conditions complicated by his use of cocaine  Dimension 3:  Emotional, Behavioral, or Cognitive Conditions and Complications:  Dimension 3:  Description of emotional, behavioral, or cognitive conditions and complications: Patient is having suicidal thought that will put an end to his addiction and states that he has previous suicide attempts.  Dimension 4:  Readiness to Change:  Dimension 4:  Description of Readiness to Change criteria: Patient states that he wants to change his life and not use any alcohol or drugs again  Dimension 5:  Relapse, Continued use, or Continued Problem Potential:  Dimension 5:  Relapse, continued use, or continued problem potential critiera  description: Patient has a history of chronic relapses  Dimension 6:  Recovery/Living Environment:  Dimension 6:  Recovery/Iiving environment criteria description: Patient is homeleaa nad has minimal support  ASAM Severity Score: ASAM's Severity Rating Score: 11  ASAM Recommended Level of Treatment: ASAM Recommended Level of Treatment: Level III Residential Treatment   Substance use Disorder (SUD) Substance Use Disorder (SUD)  Checklist Symptoms of Substance Use: Continued use despite having a persistent/recurrent physical/psychological problem caused/exacerbated by use, Continued use despite persistent or recurrent social, interpersonal problems, caused or exacerbated by use, Large amounts of time spent to obtain, use or recover from the substance(s), Evidence of tolerance, Persistent desire or unsuccessful efforts to cut down or control use, Presence of craving or strong urge to use, Recurrent use that results in a failure to fulfill major role obligations (work, school, home)  Recommendations for Services/Supports/Treatments: Recommendations for Services/Supports/Treatments Recommendations For Services/Supports/Treatments: Residential-Level 3  Discharge Disposition:    DSM5 Diagnoses: Patient Active Problem List   Diagnosis Date Noted   Alcohol use disorder 02/22/2022   Cocaine use disorder (HCC) 02/22/2022   MDD (major depressive disorder), recurrent severe, without psychosis (HCC) 02/21/2022   Polysubstance abuse (HCC) 01/25/2022   Cocaine-induced mood disorder (HCC)    Cocaine use disorder, severe, dependence (HCC)    Increased urinary frequency 11/20/2020   Nocturia more than twice per night 11/20/2020   Incomplete emptying of bladder 11/20/2020   Psychophysiological insomnia 11/20/2020   Generalized anxiety disorder 11/20/2020   Alcohol abuse 11/20/2020   Substance abuse (HCC) 11/20/2020   HLD (hyperlipidemia) 09/13/2020   HTN (hypertension) 09/13/2020   Type 2 diabetes  mellitus (HCC) 09/13/2020   Back pain 09/13/2020   Eczema 09/13/2020   Tobacco use disorder 09/13/2020     Referrals to Alternative Service(s): Referred to Alternative Service(s):   Place:   Date:   Time:    Referred to Alternative Service(s):   Place:   Date:   Time:    Referred to Alternative Service(s):   Place:   Date:   Time:    Referred to Alternative Service(s):   Place:   Date:   Time:     Dava Najjar, MA,LCMCHCA, NCC

## 2022-11-09 NOTE — Discharge Instructions (Signed)
Go to behavioral health urgent care for resources on your depression.  Use the cream we have given you for the rash on your arms.  You may also buy this over-the-counter.  Follow-up with your primary doctor within a week to have your blood work rechecked to make sure that your kidney function is improving.  Stay well-hydrated.  Return to the emergency department if you experience any concerning symptoms.

## 2022-11-09 NOTE — ED Notes (Signed)
Pt was placed in purple scrubs per policy for [facility], he was cooperative and calm during this process and complied with the request to change into the scrubs. Pt belongings have been placed in a personal back pack and and second patient belongings back with two stickers placed on the patient belonging bag. Pt transported to [facility] for treatment

## 2022-11-09 NOTE — ED Notes (Signed)
TTS in progress 

## 2022-11-09 NOTE — ED Provider Notes (Signed)
Patient has been seen by Susy Frizzle from psychiatry and is cleared.  Will have the patient follow-up with outpatient services.  Patient says he is feeling much better to me.  Does have some itching and a small rash on his right forearm.  No other symptoms of an allergic reaction.  Does appear to be also a small localized area of his left arm but not elsewhere on his body.  Will give him Benadryl topical cream for this and have him follow-up with his primary doctor and [facility].   Rondel Baton, MD 11/09/22 1017

## 2022-11-09 NOTE — ED Notes (Signed)
MD Paterson at bedside. 

## 2022-11-09 NOTE — ED Notes (Signed)
Per NP [name] patient psych cleared and able to follow in outpatient.  MD Eloise Harman notified and aware.

## 2022-11-09 NOTE — ED Provider Notes (Signed)
[place] EMERGENCY DEPARTMENT AT [facility] Provider Note   CSN: [id number] Arrival date & time: 11/09/22  0013     History  Chief Complaint  Patient presents with   Suicidal    Jeffery Wheeler is a 57 y.o. male.  The history is provided by the patient.   Patient w/history of hypertension, diabetes, depression, substance use disorder presents for multiple complaints  Patient reports he is facing housing insecurity.  He reports he is not feeling well and does not want to be here anymore.  He reports he is feeling suicidal but has no active plan at this time.  He does report previous attempt in the past with an overdose. Patient reports he has been outside recently and due to the heat has felt dehydrated.  He also endorses recent cocaine use.  He reports diffuse body cramps.  He also reports chronic neck and shoulder pain.  No headache, no fevers or vomiting.  No diarrhea.  No chest or abdominal pain.  He does report cough.  Past Medical History:  Diagnosis Date   Eczema    HLD (hyperlipidemia)    HTN (hypertension)    Type 2 diabetes mellitus (HCC)     Home Medications Prior to Admission medications   Medication Sig Start Date End Date Taking? Authorizing Provider  ACETAMINOPHEN PO Take 2 tablets by mouth 3 (three) times daily as needed (for neck pain).   Yes [provider]  amLODipine (NORVASC) 10 MG tablet Take 1 tablet (10 mg total) by mouth daily. 02/27/22  Yes Abbott Pao, Nadir, MD  atorvastatin (LIPITOR) 20 MG tablet Take 1 tablet (20 mg total) by mouth daily. 02/27/22  Yes Sarita Bottom, MD  metFORMIN (GLUCOPHAGE) 850 MG tablet Take 1 tablet (850 mg total) by mouth 2 (two) times daily with a meal. 02/27/22  Yes Sarita Bottom, MD      Allergies    Latex and Shellfish-derived products    Review of Systems   Review of Systems  Constitutional:  Positive for fatigue.  Musculoskeletal:  Positive for myalgias.  Neurological:  Negative for headaches.   Psychiatric/Behavioral:  Positive for suicidal ideas.     Physical Exam Updated Vital Signs BP 115/78 (BP Location: Left Arm)   Pulse 83   Temp 97.8 F (36.6 C) (Oral)   Resp 16   Ht 1.702 m (5\' 7" )   Wt 86 kg   SpO2 97%   BMI 29.69 kg/m  Physical Exam CONSTITUTIONAL: Disheveled, no acute distress HEAD: Normocephalic/atraumatic EYES: EOMI/PERRL ENMT: Mucous membranes moist NECK: supple no meningeal signs SPINE/BACK:entire spine nontender CV: S1/S2 noted, no murmurs/rubs/gallops noted LUNGS: Lungs are clear to auscultation bilaterally, no apparent distress ABDOMEN: soft, nontender GU:no cva tenderness NEURO: Pt is awake/alert/appropriate, moves all extremitiesx4.  No facial droop.   EXTREMITIES: pulses normal/equal, full ROM SKIN: warm, color normal PSYCH: no abnormalities of mood noted, alert and oriented to situation  ED Results / Procedures / Treatments   Labs (all labs ordered are listed, but only abnormal results are displayed) Labs Reviewed  COMPREHENSIVE METABOLIC PANEL - Abnormal; Notable for the following components:      Result Value   Potassium 3.4 (*)    CO2 21 (*)    Glucose, Bld 150 (*)    BUN 21 (*)    Creatinine, Ser 2.02 (*)    Total Protein 6.4 (*)    GFR, Estimated 38 (*)    Anion gap 16 (*)    All other components  within normal limits  SALICYLATE LEVEL - Abnormal; Notable for the following components:   Salicylate Lvl <7.0 (*)    All other components within normal limits  ACETAMINOPHEN LEVEL - Abnormal; Notable for the following components:   Acetaminophen (Tylenol), Serum <10 (*)    All other components within normal limits  CBC - Abnormal; Notable for the following components:   WBC 32.9 (*)    RDW 16.7 (*)    Platelets 448 (*)    All other components within normal limits  RAPID URINE DRUG SCREEN, HOSP PERFORMED - Abnormal; Notable for the following components:   Opiates POSITIVE (*)    Cocaine POSITIVE (*)    Tetrahydrocannabinol  POSITIVE (*)    All other components within normal limits  URINALYSIS, ROUTINE W REFLEX MICROSCOPIC - Abnormal; Notable for the following components:   Color, Urine AMBER (*)    APPearance HAZY (*)    Bilirubin Urine SMALL (*)    Protein, ur 30 (*)    All other components within normal limits  I-STAT CHEM 8, ED - Abnormal; Notable for the following components:   BUN 21 (*)    Creatinine, Ser 1.50 (*)    Glucose, Bld 121 (*)    Calcium, Ion 1.04 (*)    All other components within normal limits  I-STAT CHEM 8, ED - Abnormal; Notable for the following components:   BUN 21 (*)    Creatinine, Ser 1.40 (*)    Glucose, Bld 118 (*)    Calcium, Ion 1.06 (*)    All other components within normal limits  SARS CORONAVIRUS 2 BY RT PCR  ETHANOL  CK    EKG None  Radiology DG Chest 2 View  Result Date: 11/09/2022 CLINICAL DATA:  Cough EXAM: CHEST - 2 VIEW COMPARISON:  09/21/2022 FINDINGS: The heart size and mediastinal contours are within normal limits. Both lungs are clear. The visualized skeletal structures are unremarkable. IMPRESSION: No active cardiopulmonary disease. Electronically Signed   By: Deatra Robinson M.D.   On: 11/09/2022 02:33    Procedures Procedures    Medications Ordered in ED Medications  sodium chloride 0.9 % bolus 1,000 mL (0 mLs Intravenous Stopped 11/09/22 0326)  sodium chloride 0.9 % bolus 1,000 mL (0 mLs Intravenous Stopped 11/09/22 0431)    ED Course/ Medical Decision Making/ A&P Clinical Course as of 11/09/22 0453  Sat Nov 09, 2022  0151 Creatinine(!): 2.02 Acute kidney injury [DW]  0151 WBC(!): 32.9 leukocytosis [DW]  0218 Patient reports facing housing insecurity, has been outside recently abusing cocaine.  Now reports diffuse body aches and chronic shoulder and neck pain.  Patient also reports feeling suicidal without plan.  Labs reveal acute kidney injury, we will screen for rhabdomyolysis.  Patient does appear to have acute on chronic leukocytosis of  unclear etiology.  He is not febrile, but will screen for infectious etiology [DW]  0451 Patient given IV fluids, repeat creatinine is now improving.  He has no other acute complaints.  Patient will now be evaluated by behavioral health.  His acute on chronic leukocytosis can be followed up on as an outpatient.  No obvious infectious etiology as patient is resting comfortably in no acute distress. [DW]    Clinical Course User Index [DW] Zadie Rhine, MD                             Medical Decision Making Amount and/or Complexity of Data Reviewed Labs:  ordered. Decision-making details documented in ED Course. Radiology: ordered.   This patient presents to the ED for concern of suicidal ideation, this involves an extensive number of treatment options, and is a complaint that carries with it a high risk of complications and morbidity.  The differential diagnosis includes but is not limited to depression, schizoaffective disorder, malingering, schizophrenia, anxiety disorder  Comorbidities that complicate the patient evaluation: Patient's presentation is complicated by their history of major depressive disorder  Social Determinants of Health: Patient's unhoused and substance use   increases the complexity of managing their presentation  Additional history obtained: Records reviewed Care Everywhere/External Records  Lab Tests: I Ordered, and personally interpreted labs.  The pertinent results include: Acute kidney injury, leukocytosis, dehydration  Imaging Studies ordered: I ordered imaging studies including X-ray chest   I independently visualized and interpreted imaging which showed no acute findings I agree with the radiologist interpretation   Medicines ordered and prescription drug management: I ordered medication including IV fluids for dehydration Reevaluation of the patient after these medicines showed that the patient    improved   Critical Interventions:  IV  fluids  Reevaluation: After the interventions noted above, I reevaluated the patient and found that they have :improved  Complexity of problems addressed: Patient's presentation is most consistent with  acute presentation with potential threat to life or bodily function            Final Clinical Impression(s) / ED Diagnoses Final diagnoses:  Suicidal ideation  Substance use disorder  Dehydration  Leukocytosis, unspecified type    Rx / DC Orders ED Discharge Orders     None         Zadie Rhine, MD 11/09/22 [phone]

## 2022-11-09 NOTE — ED Notes (Signed)
Pt belongings obtained from pt, white belongings bag and red backpack placed in locker 6 in purple.

## 2022-11-11 ENCOUNTER — Encounter: Payer: Self-pay | Admitting: *Deleted

## 2022-11-11 NOTE — Congregational Nurse Program (Signed)
  Dept: [phone]   Congregational Nurse Program Note  Date of Encounter: 11/11/2022  Past Medical History: Past Medical History:  Diagnosis Date   Eczema    HLD (hyperlipidemia)    HTN (hypertension)    Type 2 diabetes mellitus (HCC)     Encounter Details:  CNP Questionnaire - 11/11/22 1535       Questionnaire   Ask client: Do you give verbal consent for me to treat you today? Yes    Student Assistance N/A    Location Patient Served  [facility]    Visit Setting with Client Phone/Text/Email;Organization    Patient Status Unhoused    Insurance Medicaid    Insurance/Financial Assistance Referral N/A    Medication N/A    Medical Provider Yes    Screening Referrals Made N/A    Medical Referrals Made N/A    Medical Appointment Made N/A    Recently w/o PCP, now 1st time PCP visit completed due to CNs referral or appointment made N/A    Food N/A    Transportation N/A    Housing/Utilities No permanent housing    Interpersonal Safety N/A    Interventions Advocate/Support;Counsel    Abnormal to Normal Screening Since Last CN Visit N/A    Screenings CN Performed N/A    Sent Client to Lab for: N/A    Did client attend any of the following based off CNs referral or appointments made? N/A    ED Visit Averted N/A    Life-Saving Intervention Made N/A            Client came to nurse's office requesting to talk with someone concerning his mental health. He has an appt this afternoon with [facility]. Client reports he recently had thoughts of suicide. He currently denies si and hi. Client has stressors related to experiencing homelessness, family conflict, girlfriend conflict and substance abuse. Offered support and allowed client to talk about feelings. Client reports difficulty coping after being in prison for 30 years. Discussed supportive options of AA, church etc. Client is considering going back to [facility] in [place] if accepted. Tried to call for client and received  voice mail to leave message. Client's phone is currently not working. Did not leave message. Client reports he will see writer again on Wednesday for follow up.  Breezy Hertenstein W RN CN

## 2022-11-13 ENCOUNTER — Encounter: Payer: Commercial Managed Care - HMO | Admitting: Nurse Practitioner

## 2022-11-13 ENCOUNTER — Other Ambulatory Visit: Payer: Commercial Managed Care - HMO

## 2022-11-24 ENCOUNTER — Encounter (HOSPITAL_COMMUNITY): Payer: Self-pay

## 2022-11-24 ENCOUNTER — Emergency Department (HOSPITAL_COMMUNITY): Payer: MEDICAID

## 2022-11-24 ENCOUNTER — Emergency Department (HOSPITAL_COMMUNITY)
Admission: EM | Admit: 2022-11-24 | Discharge: 2022-11-24 | Disposition: A | Discharge status: Home / Self Care | Payer: MEDICAID | Attending: Emergency Medicine | Admitting: Emergency Medicine

## 2022-11-24 ENCOUNTER — Other Ambulatory Visit: Payer: Self-pay

## 2022-11-24 DIAGNOSIS — R600 Localized edema: Secondary | ICD-10-CM

## 2022-11-24 DIAGNOSIS — E119 Type 2 diabetes mellitus without complications: Secondary | ICD-10-CM | POA: Diagnosis not present

## 2022-11-24 DIAGNOSIS — Z79899 Other long term (current) drug therapy: Secondary | ICD-10-CM | POA: Insufficient documentation

## 2022-11-24 DIAGNOSIS — I10 Essential (primary) hypertension: Secondary | ICD-10-CM | POA: Diagnosis not present

## 2022-11-24 DIAGNOSIS — Z23 Encounter for immunization: Secondary | ICD-10-CM | POA: Diagnosis not present

## 2022-11-24 DIAGNOSIS — S91012A Laceration without foreign body, left ankle, initial encounter: Secondary | ICD-10-CM | POA: Diagnosis present

## 2022-11-24 DIAGNOSIS — W25XXXA Contact with sharp glass, initial encounter: Secondary | ICD-10-CM | POA: Diagnosis not present

## 2022-11-24 DIAGNOSIS — Z7984 Long term (current) use of oral hypoglycemic drugs: Secondary | ICD-10-CM | POA: Diagnosis not present

## 2022-11-24 DIAGNOSIS — Z91040 Latex allergy status: Secondary | ICD-10-CM | POA: Insufficient documentation

## 2022-11-24 MED ORDER — TETANUS-DIPHTH-ACELL PERTUSSIS 5-2.5-18.5 LF-MCG/0.5 IM SUSY
0.5000 mL | PREFILLED_SYRINGE | Freq: Once | INTRAMUSCULAR | Status: AC
  Administered 2022-11-24: 0.5 mL via INTRAMUSCULAR
  Filled 2022-11-24: qty 0.5

## 2022-11-24 NOTE — Discharge Instructions (Addendum)
Please use Tylenol or ibuprofen for pain.  You may use 600 mg ibuprofen every 6 hours or 1000 mg of Tylenol every 6 hours.  You may choose to alternate between the 2.  This would be most effective.  Not to exceed 4 g of Tylenol within 24 hours.  Not to exceed 3200 mg ibuprofen 24 hours.  Keeping her legs elevated, decreasing the amount that you are walking, and wearing compression stockings can help with the swelling in your lower extremities.
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
## 2022-11-28 ENCOUNTER — Inpatient Hospital Stay: Payer: MEDICAID

## 2022-11-28 ENCOUNTER — Inpatient Hospital Stay: Payer: MEDICAID | Attending: Hematology and Oncology | Admitting: Hematology and Oncology

## 2022-11-30 ENCOUNTER — Other Ambulatory Visit: Payer: Self-pay

## 2022-11-30 ENCOUNTER — Encounter (HOSPITAL_COMMUNITY): Payer: Self-pay | Admitting: Emergency Medicine

## 2022-11-30 ENCOUNTER — Emergency Department (HOSPITAL_COMMUNITY): Payer: MEDICAID

## 2022-11-30 ENCOUNTER — Observation Stay (HOSPITAL_COMMUNITY)
Admission: EM | Admit: 2022-11-30 | Discharge: 2022-12-01 | Disposition: A | Discharge status: Home / Self Care | Payer: MEDICAID | Attending: Internal Medicine | Admitting: Internal Medicine

## 2022-11-30 DIAGNOSIS — I10 Essential (primary) hypertension: Secondary | ICD-10-CM | POA: Insufficient documentation

## 2022-11-30 DIAGNOSIS — S62396B Other fracture of fifth metacarpal bone, right hand, initial encounter for open fracture: Principal | ICD-10-CM | POA: Insufficient documentation

## 2022-11-30 DIAGNOSIS — F17210 Nicotine dependence, cigarettes, uncomplicated: Secondary | ICD-10-CM | POA: Diagnosis not present

## 2022-11-30 DIAGNOSIS — Z7984 Long term (current) use of oral hypoglycemic drugs: Secondary | ICD-10-CM | POA: Insufficient documentation

## 2022-11-30 DIAGNOSIS — L03116 Cellulitis of left lower limb: Secondary | ICD-10-CM | POA: Diagnosis not present

## 2022-11-30 DIAGNOSIS — Z79899 Other long term (current) drug therapy: Secondary | ICD-10-CM | POA: Insufficient documentation

## 2022-11-30 DIAGNOSIS — Z91040 Latex allergy status: Secondary | ICD-10-CM | POA: Insufficient documentation

## 2022-11-30 DIAGNOSIS — L03113 Cellulitis of right upper limb: Secondary | ICD-10-CM | POA: Diagnosis not present

## 2022-11-30 DIAGNOSIS — W228XXA Striking against or struck by other objects, initial encounter: Secondary | ICD-10-CM | POA: Diagnosis not present

## 2022-11-30 DIAGNOSIS — E119 Type 2 diabetes mellitus without complications: Secondary | ICD-10-CM | POA: Insufficient documentation

## 2022-11-30 DIAGNOSIS — M79641 Pain in right hand: Secondary | ICD-10-CM | POA: Diagnosis present

## 2022-11-30 DIAGNOSIS — L0390 Cellulitis, unspecified: Secondary | ICD-10-CM | POA: Diagnosis present

## 2022-11-30 LAB — CBC WITH DIFFERENTIAL/PLATELET
Abs Immature Granulocytes: 0.36 10*3/uL — ABNORMAL HIGH (ref 0.00–0.07)
Basophils Absolute: 0.1 10*3/uL (ref 0.0–0.1)
Basophils Relative: 0 %
Eosinophils Absolute: 0 10*3/uL (ref 0.0–0.5)
Eosinophils Relative: 0 %
HCT: 40.4 % (ref 39.0–52.0)
Hemoglobin: 12.9 g/dL — ABNORMAL LOW (ref 13.0–17.0)
Immature Granulocytes: 1 %
Lymphocytes Relative: 9 %
Lymphs Abs: 2.4 10*3/uL (ref 0.7–4.0)
MCH: 27.5 pg (ref 26.0–34.0)
MCHC: 31.9 g/dL (ref 30.0–36.0)
MCV: 86.1 fL (ref 80.0–100.0)
Monocytes Absolute: 1.7 10*3/uL — ABNORMAL HIGH (ref 0.1–1.0)
Monocytes Relative: 7 %
Neutro Abs: 21.2 10*3/uL — ABNORMAL HIGH (ref 1.7–7.7)
Neutrophils Relative %: 83 %
Platelets: 506 10*3/uL — ABNORMAL HIGH (ref 150–400)
RBC: 4.69 MIL/uL (ref 4.22–5.81)
RDW: 17.6 % — ABNORMAL HIGH (ref 11.5–15.5)
WBC: 25.7 10*3/uL — ABNORMAL HIGH (ref 4.0–10.5)
nRBC: 0 % (ref 0.0–0.2)

## 2022-11-30 LAB — COMPREHENSIVE METABOLIC PANEL
ALT: 43 U/L (ref 0–44)
AST: 32 U/L (ref 15–41)
Albumin: 3.2 g/dL — ABNORMAL LOW (ref 3.5–5.0)
Alkaline Phosphatase: 81 U/L (ref 38–126)
Anion gap: 9 (ref 5–15)
BUN: 17 mg/dL (ref 6–20)
CO2: 25 mmol/L (ref 22–32)
Calcium: 8.7 mg/dL — ABNORMAL LOW (ref 8.9–10.3)
Chloride: 108 mmol/L (ref 98–111)
Creatinine, Ser: 1.13 mg/dL (ref 0.61–1.24)
GFR, Estimated: >60 mL/min (ref >60)
Glucose, Bld: 146 mg/dL — ABNORMAL HIGH (ref 70–99)
Potassium: 4.6 mmol/L (ref 3.5–5.1)
Sodium: 142 mmol/L (ref 135–145)
Total Bilirubin: 0.6 mg/dL (ref 0.3–1.2)
Total Protein: 6.3 g/dL — ABNORMAL LOW (ref 6.5–8.1)

## 2022-11-30 LAB — URINALYSIS, ROUTINE W REFLEX MICROSCOPIC
Bacteria, UA: NONE SEEN
Bilirubin Urine: NEGATIVE
Glucose, UA: NEGATIVE mg/dL
Hgb urine dipstick: NEGATIVE
Ketones, ur: NEGATIVE mg/dL
Leukocytes,Ua: NEGATIVE
Nitrite: NEGATIVE
Protein, ur: 30 mg/dL — AB
Specific Gravity, Urine: 1.032 — ABNORMAL HIGH (ref 1.005–1.030)
pH: 5 (ref 5.0–8.0)

## 2022-11-30 LAB — GLUCOSE, CAPILLARY: Glucose-Capillary: 203 mg/dL — ABNORMAL HIGH (ref 70–99)

## 2022-11-30 LAB — HEPATITIS B SURFACE ANTIGEN: Hepatitis B Surface Ag: NONREACTIVE

## 2022-11-30 LAB — I-STAT CG4 LACTIC ACID, ED: Lactic Acid, Venous: 1.2 mmol/L (ref 0.5–1.9)

## 2022-11-30 LAB — HEPATITIS PANEL, ACUTE
HCV Ab: NONREACTIVE
Hep A IgM: NONREACTIVE
Hep B C IgM: NONREACTIVE
Hepatitis B Surface Ag: NONREACTIVE

## 2022-11-30 LAB — SEDIMENTATION RATE: Sed Rate: 17 mm/hr — ABNORMAL HIGH (ref 0–16)

## 2022-11-30 MED ORDER — SODIUM CHLORIDE 0.9 % IV SOLN
3.0000 g | Freq: Four times a day (QID) | INTRAVENOUS | Status: DC
  Administered 2022-11-30 – 2022-12-01 (×3): 3 g via INTRAVENOUS
  Filled 2022-11-30 (×3): qty 8

## 2022-11-30 MED ORDER — SODIUM CHLORIDE 0.9 % IV SOLN
3.0000 g | Freq: Once | INTRAVENOUS | Status: AC
  Administered 2022-11-30: 3 g via INTRAVENOUS
  Filled 2022-11-30: qty 8

## 2022-11-30 MED ORDER — SERTRALINE HCL 25 MG PO TABS
50.0000 mg | ORAL_TABLET | Freq: Every day | ORAL | Status: DC
  Administered 2022-12-01: 50 mg via ORAL
  Filled 2022-11-30: qty 2

## 2022-11-30 MED ORDER — AMLODIPINE BESYLATE 10 MG PO TABS
10.0000 mg | ORAL_TABLET | Freq: Every day | ORAL | Status: DC
  Administered 2022-12-01: 10 mg via ORAL
  Filled 2022-11-30: qty 1

## 2022-11-30 MED ORDER — CIPROFLOXACIN HCL 500 MG PO TABS: 500.0000 mg | ORAL_TABLET | Freq: Two times a day (BID) | ORAL | Status: DC

## 2022-11-30 MED ORDER — ACETAMINOPHEN 650 MG RE SUPP: 650.0000 mg | Freq: Four times a day (QID) | RECTAL | Status: DC | PRN

## 2022-11-30 MED ORDER — SODIUM CHLORIDE 0.9 % IV SOLN
1.5000 g | Freq: Once | INTRAVENOUS | Status: DC
Start: 2022-11-30 — End: 2022-11-30

## 2022-11-30 MED ORDER — ATORVASTATIN CALCIUM 10 MG PO TABS
20.0000 mg | ORAL_TABLET | Freq: Every day | ORAL | Status: DC
  Administered 2022-12-01: 20 mg via ORAL
  Filled 2022-11-30: qty 2

## 2022-11-30 MED ORDER — CLINDAMYCIN PHOSPHATE 600 MG/4ML IJ SOLN
450.0000 mg | Freq: Three times a day (TID) | INTRAMUSCULAR | Status: DC
Start: 2022-11-30 — End: 2022-11-30

## 2022-11-30 MED ORDER — ACETAMINOPHEN 325 MG PO TABS: 650.0000 mg | ORAL_TABLET | Freq: Four times a day (QID) | ORAL | Status: DC | PRN

## 2022-11-30 MED ORDER — ENOXAPARIN SODIUM 40 MG/0.4ML IJ SOSY
40.0000 mg | PREFILLED_SYRINGE | INTRAMUSCULAR | Status: DC
  Administered 2022-11-30: 40 mg via SUBCUTANEOUS
  Filled 2022-11-30: qty 0.4

## 2022-11-30 MED ORDER — ACETAMINOPHEN 500 MG PO TABS
1000.0000 mg | ORAL_TABLET | Freq: Four times a day (QID) | ORAL | Status: DC | PRN
  Administered 2022-11-30: 1000 mg via ORAL
  Filled 2022-11-30: qty 2
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
## 2022-11-30 NOTE — Progress Notes (Signed)
Orthopedic Tech Progress Note Patient Details:  Jeffery Wheeler 12/13/1965 [id number]  Ortho Devices Type of Ortho Device: Ulna gutter splint Ortho Device/Splint Location: RUE Ortho Device/Splint Interventions: Application   Post Interventions Patient Tolerated: Well  [name] 11/30/2022, 6:45 PM
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
## 2022-11-30 NOTE — Progress Notes (Signed)
Pharmacy Antibiotic Note  Jeffery Wheeler is a 57 y.o. male admitted on 11/30/2022 with cellulitis.  Pharmacy has been consulted for Unasyn dosing.  CrCl 70-80 mL/min  Plan: Unasyn 3g Q6H Pharmacy will monitor peripherally for dose adjustments  Height: 5\' 7"  (170.2 cm) Weight: 85.3 kg (188 lb) IBW/kg (Calculated) : 66.1  Temp (24hrs), Avg:98.4 F (36.9 C), Min:98.4 F (36.9 C), Max:98.4 F (36.9 C)  Recent Labs  Lab 11/30/22 1241 11/30/22 1243  WBC 25.7*  --   CREATININE 1.13  --   LATICACIDVEN  --  1.2    Estimated Creatinine Clearance: 74.4 mL/min (by C-G formula based on SCr of 1.13 mg/dL).    Allergies  Allergen Reactions   Latex Rash and Other (See Comments)    Skin breaks out   Shellfish-Derived Products Rash    Thank you for allowing pharmacy to be a part of this patient's care.  Eldridge Scot, PharmD Clinical Pharmacist 11/30/2022, 4:08 PM
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
## 2022-12-01 DIAGNOSIS — L03113 Cellulitis of right upper limb: Secondary | ICD-10-CM | POA: Diagnosis not present

## 2022-12-01 DIAGNOSIS — L03116 Cellulitis of left lower limb: Secondary | ICD-10-CM

## 2022-12-01 LAB — CBC
HCT: 36.5 % — ABNORMAL LOW (ref 39.0–52.0)
Hemoglobin: 11.9 g/dL — ABNORMAL LOW (ref 13.0–17.0)
MCH: 26.7 pg (ref 26.0–34.0)
MCHC: 32.6 g/dL (ref 30.0–36.0)
MCV: 81.8 fL (ref 80.0–100.0)
Platelets: 445 10*3/uL — ABNORMAL HIGH (ref 150–400)
RBC: 4.46 MIL/uL (ref 4.22–5.81)
RDW: 17.2 % — ABNORMAL HIGH (ref 11.5–15.5)
WBC: 19.2 10*3/uL — ABNORMAL HIGH (ref 4.0–10.5)
nRBC: 0 % (ref 0.0–0.2)

## 2022-12-01 LAB — BASIC METABOLIC PANEL WITH GFR
Anion gap: 7 (ref 5–15)
BUN: 14 mg/dL (ref 6–20)
CO2: 26 mmol/L (ref 22–32)
Calcium: 8.4 mg/dL — ABNORMAL LOW (ref 8.9–10.3)
Chloride: 107 mmol/L (ref 98–111)
Creatinine, Ser: 0.92 mg/dL (ref 0.61–1.24)
GFR, Estimated: >60 mL/min (ref >60)
Glucose, Bld: 135 mg/dL — ABNORMAL HIGH (ref 70–99)
Potassium: 3.7 mmol/L (ref 3.5–5.1)
Sodium: 140 mmol/L (ref 135–145)

## 2022-12-01 LAB — RPR: RPR Ser Ql: NONREACTIVE

## 2022-12-01 LAB — HIV ANTIBODY (ROUTINE TESTING W REFLEX): HIV Screen 4th Generation wRfx: NONREACTIVE

## 2022-12-01 MED ORDER — SERTRALINE HCL 50 MG PO TABS: 50.0000 mg | ORAL_TABLET | Freq: Every day | ORAL | 0 refills | Status: DC

## 2022-12-01 MED ORDER — AMOXICILLIN-POT CLAVULANATE 875-125 MG PO TABS: 1.0000 | ORAL_TABLET | Freq: Two times a day (BID) | ORAL | 0 refills | Status: AC
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
## 2022-12-01 NOTE — Plan of Care (Signed)

## 2022-12-02 ENCOUNTER — Ambulatory Visit: Payer: MEDICAID | Attending: Adult Medicine | Admitting: Audiologist

## 2022-12-02 DIAGNOSIS — H9011 Conductive hearing loss, unilateral, right ear, with unrestricted hearing on the contralateral side: Secondary | ICD-10-CM | POA: Diagnosis present

## 2022-12-02 NOTE — Procedures (Signed)
  Outpatient Audiology and [facility] [address] [place], [place]  [address] [phone]  AUDIOLOGICAL  EVALUATION  NAME: Jeffery Wheeler     DOB:   1965-05-18      MRN: [id number]                                                                                     DATE: 12/02/2022     REFERENT: [facility] STATUS: Outpatient DIAGNOSIS: Conductive Hearing Loss, Right Ear    History: Jeffery Wheeler was seen for an audiological evaluation.  Jeffery Wheeler is receiving a hearing evaluation due to concerns for difficulty hearing. Jeffery Wheeler has difficulty hearing in his right ear. This difficulty began about a year ago. No pain or pressure reported in either ear. Intermittent Tinnitus reported for both ears. 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  Medical history positive for diabetes which is a risk factor for hearing loss. No other relevant case history reported.   Evaluation:  Otoscopy showed a clear view of the tympanic membranes, bilaterally Tympanometry results were consistent with normal middle ear function and tympanic membrane movement in the left ear (Type A), and no tympanic membrane movement in the right ear (Type B)  Audiometric testing was completed using conventional audiometry with supraural transducer. Speech Recognition Thresholds were 50 dB with 40 dB masking noise in the right ear and 15 dB in the left ear. Word Recognition was  performed 40 dB SL, scored 100% % in the right ear with 60 dB masking and 100% in the left ear. Pure tone thresholds show mild to moderate conductive hearing loss in the right ear and normal sloping to mild at 8 kHz hearing loss in the left ear.   Results:  The test results were reviewed with Jeffery Wheeler. He has a unilateral mild to moderate conductive hearing loss on the right side. Recommend referral to ENT.   Recommendations:  Referral to ENT Physician necessary due to unilateral conductive hearing loss in right ear.    30  minutes spent testing and counseling on results.   Jeffery Wheeler  Audiologist, Au.D., CCC-A 12/02/2022  4:05 PM  Jeffery Alen Tera Partridge, MS Audiology Student    Cc: [facility]

## 2022-12-05 LAB — CULTURE, BLOOD (ROUTINE X 2)
Culture: NO GROWTH
Culture: NO GROWTH
Special Requests: ADEQUATE
Special Requests: ADEQUATE

## 2022-12-27 ENCOUNTER — Telehealth (HOSPITAL_COMMUNITY): Payer: Self-pay | Admitting: Licensed Clinical Social Worker

## 2022-12-27 NOTE — Telephone Encounter (Signed)
CALLED PT. THEY CONFIRMED NEW PT APPT FOR 8/27 AT 8AM.

## 2022-12-31 ENCOUNTER — Ambulatory Visit (HOSPITAL_COMMUNITY): Payer: MEDICAID | Admitting: Licensed Clinical Social Worker

## 2023-04-17 IMAGING — CR DG CHEST 2V
2 series · 2 of 2 positions shown · non-contrast
Comparison: None

CLINICAL DATA: Tinnitus LEFT ear, hearing loss, 2 weeks post URI

EXAM:
CHEST - 2 VIEW

[chest pa]
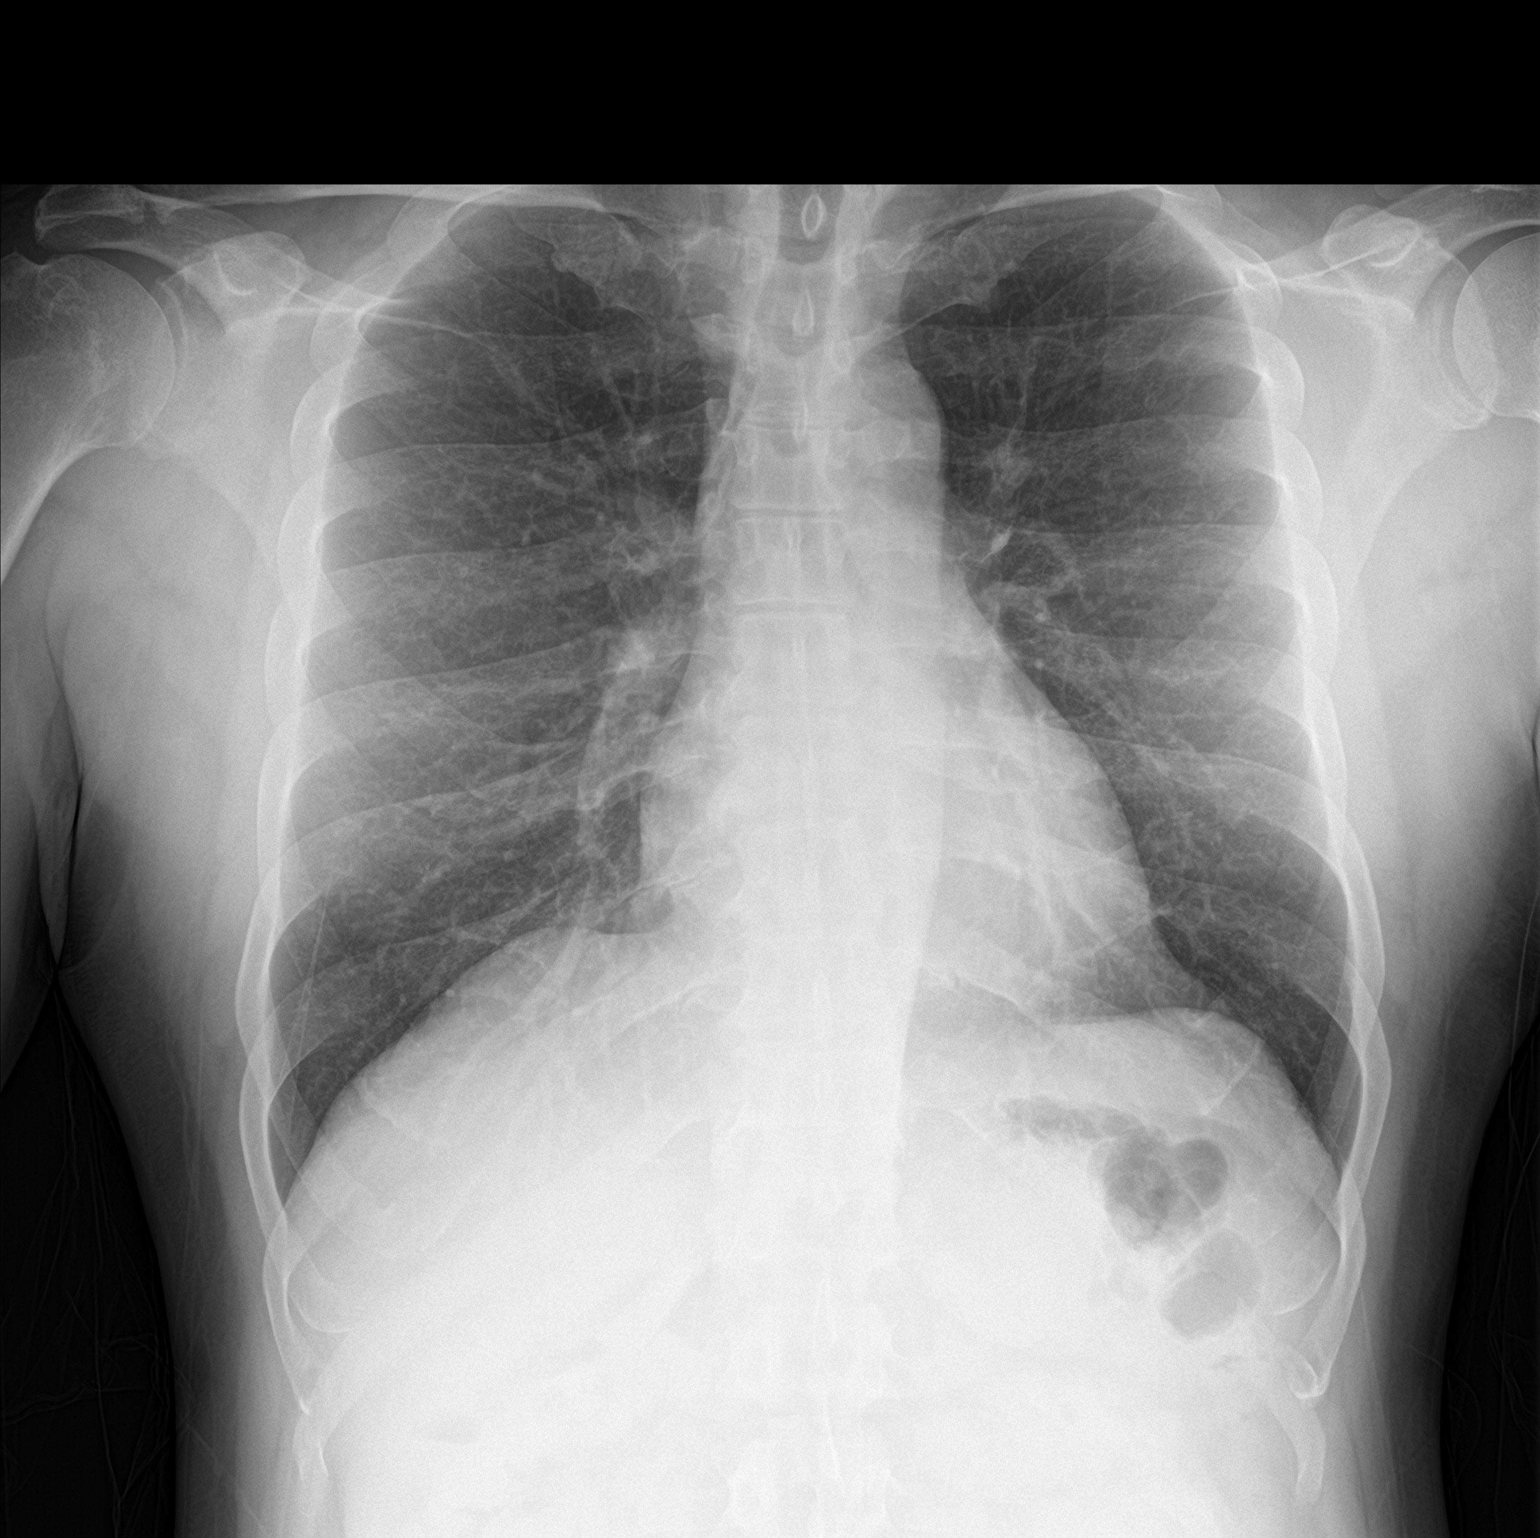

[chest lat]
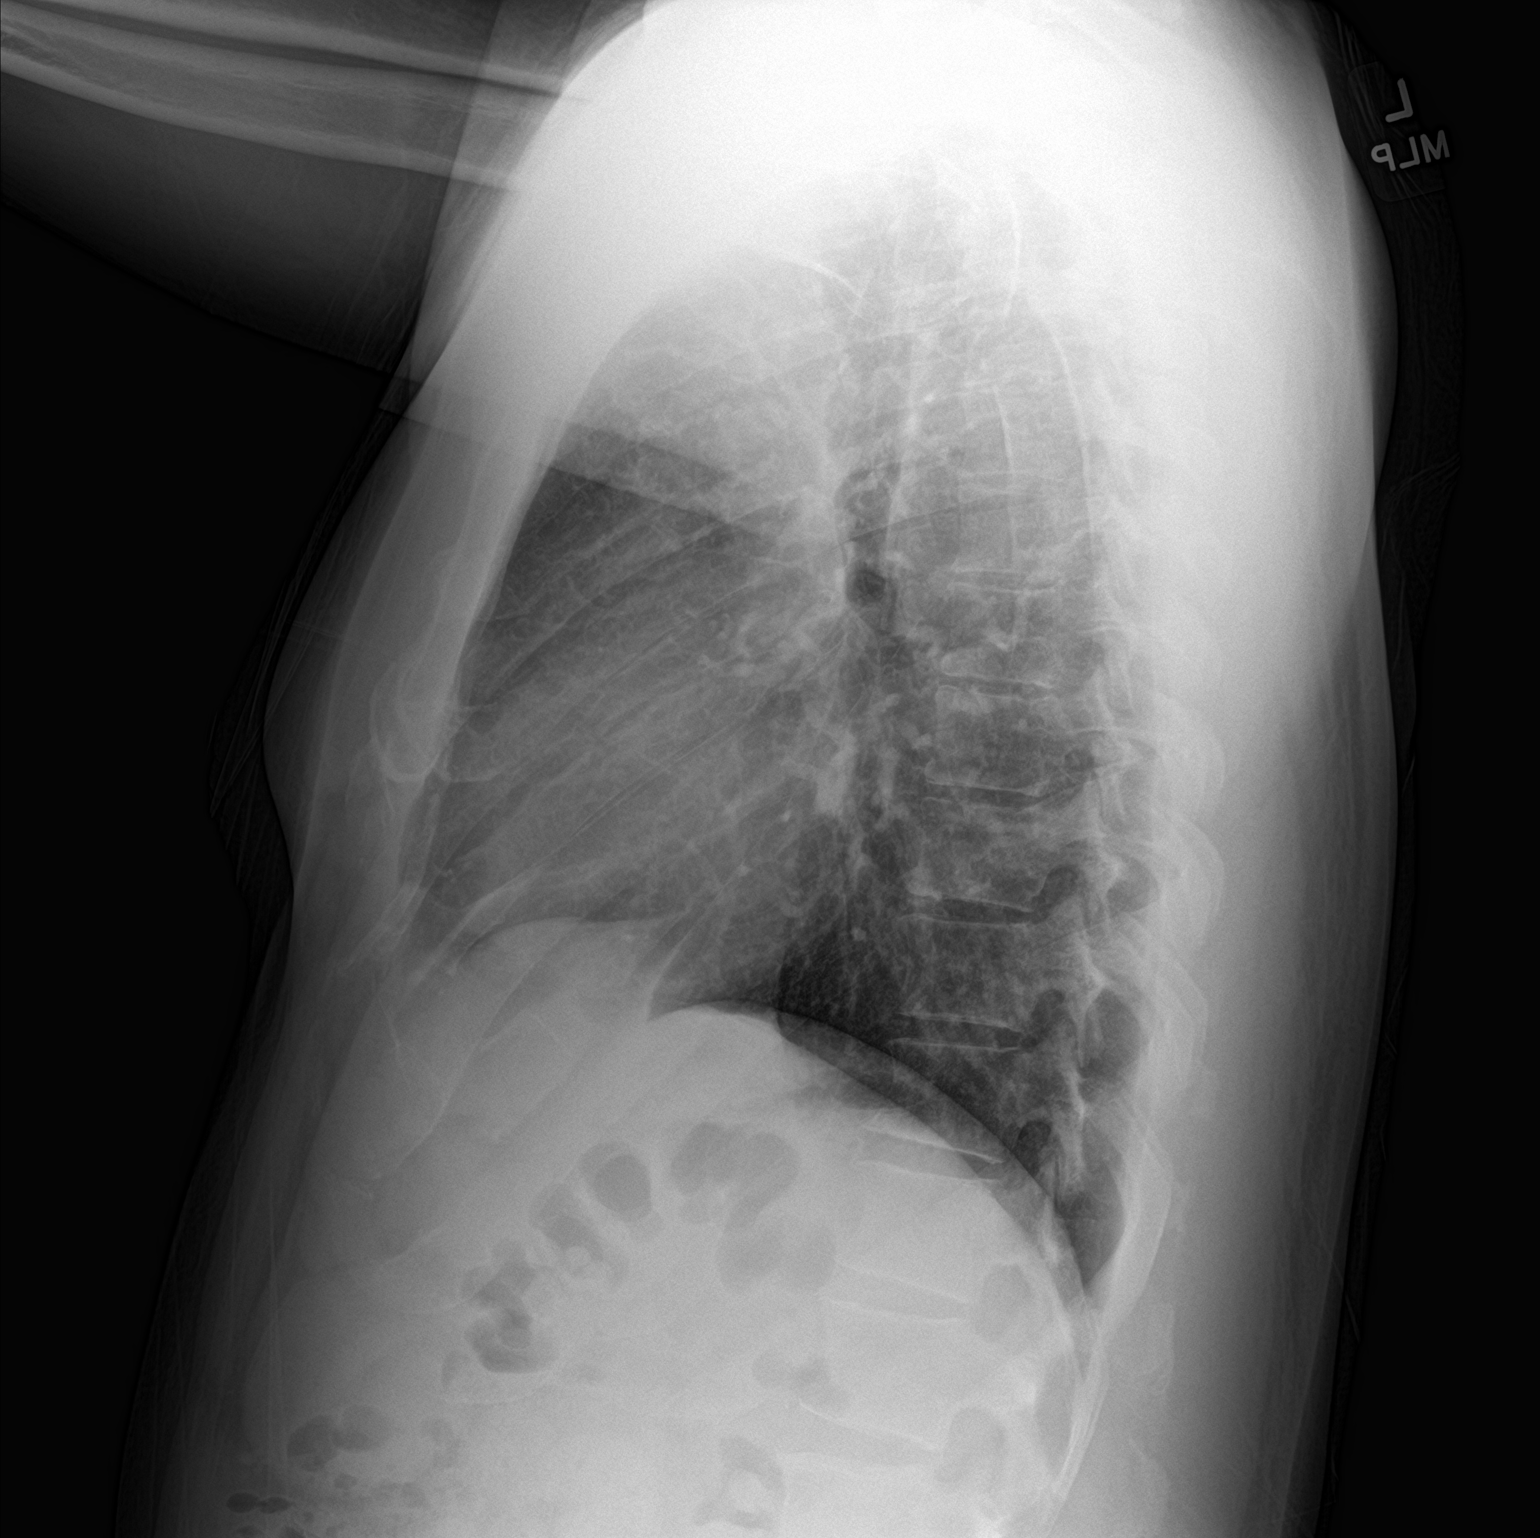

[2 of 2 positions shown; findings below may reference images not displayed]

FINDINGS: Normal heart size, mediastinal contours, and pulmonary vascularity.

Lungs clear.

No pleural effusion or pneumothorax.

Bones unremarkable.
IMPRESSION: Normal exam.

## 2023-04-18 ENCOUNTER — Ambulatory Visit (HOSPITAL_COMMUNITY): Payer: Self-pay

## 2023-04-19 ENCOUNTER — Encounter (HOSPITAL_COMMUNITY): Payer: Self-pay

## 2023-04-19 ENCOUNTER — Other Ambulatory Visit: Payer: Self-pay

## 2023-04-19 ENCOUNTER — Ambulatory Visit (HOSPITAL_COMMUNITY)
Admission: RE | Admit: 2023-04-19 | Discharge: 2023-04-19 | Disposition: A | Discharge status: Home / Self Care | Payer: MEDICAID | Source: Ambulatory Visit

## 2023-04-19 VITALS — BP 153/86 | HR 74 | Temp 98.5°F | Resp 18

## 2023-04-19 DIAGNOSIS — M5412 Radiculopathy, cervical region: Secondary | ICD-10-CM

## 2023-04-19 DIAGNOSIS — M62838 Other muscle spasm: Secondary | ICD-10-CM | POA: Diagnosis not present

## 2023-04-19 DIAGNOSIS — H9202 Otalgia, left ear: Secondary | ICD-10-CM

## 2023-04-19 MED ORDER — KETOROLAC TROMETHAMINE 30 MG/ML IJ SOLN
INTRAMUSCULAR | Status: AC
Start: 2023-04-19 — End: ?
  Filled 2023-04-19: qty 1

## 2023-04-19 MED ORDER — CLARITIN-D 24 HOUR 10-240 MG PO TB24: 1.0000 | ORAL_TABLET | Freq: Every day | ORAL | 0 refills | Status: DC

## 2023-04-19 MED ORDER — METHYLPREDNISOLONE SODIUM SUCC 125 MG IJ SOLR
60.0000 mg | Freq: Once | INTRAMUSCULAR | Status: AC
  Administered 2023-04-19: 60 mg via INTRAMUSCULAR

## 2023-04-19 MED ORDER — KETOROLAC TROMETHAMINE 30 MG/ML IJ SOLN
30.0000 mg | Freq: Once | INTRAMUSCULAR | Status: AC
  Administered 2023-04-19: 30 mg via INTRAMUSCULAR

## 2023-04-19 MED ORDER — METHYLPREDNISOLONE SODIUM SUCC 125 MG IJ SOLR
INTRAMUSCULAR | Status: AC
  Filled 2023-04-19: qty 2

## 2023-04-19 MED ORDER — BACLOFEN 10 MG PO TABS: 10.0000 mg | ORAL_TABLET | Freq: Three times a day (TID) | ORAL | 0 refills | Status: DC

## 2023-04-19 MED ORDER — DICLOFENAC SODIUM 75 MG PO TBEC: 75.0000 mg | DELAYED_RELEASE_TABLET | Freq: Two times a day (BID) | ORAL | 0 refills | Status: DC

## 2023-04-19 NOTE — Discharge Instructions (Addendum)
Take Diclofenac and baclofen as needed for muscle spasms and pain. Recommend Salon Pas patches Ice on for 20 minutes to affected area every other hour as needed for pain.   Follow-up with primary care provider as needed.   Consider massages as needed for adjunct pain control.

## 2023-04-19 NOTE — ED Triage Notes (Addendum)
Pt is here with neck and left shoulder pain that started 8 days ago, pt has taken OTC meds to relieve discomfort. Feels like his hearing is off and thinking it could be clogged per patient.

## 2023-04-21 ENCOUNTER — Encounter (HOSPITAL_COMMUNITY): Payer: Self-pay | Admitting: Emergency Medicine

## 2023-05-20 ENCOUNTER — Ambulatory Visit: Payer: MEDICAID | Admitting: Nurse Practitioner

## 2023-05-20 LAB — GLUCOSE, POCT (MANUAL RESULT ENTRY): POC Glucose: 153 mg/dL — AB (ref 70–99)

## 2023-05-20 NOTE — Progress Notes (Signed)
 New pt to [facility]. HIPAA form signed. Pt currently unhoused with h/o HTN, Diabetes, and arthritis. Pt not taking meds because he cannot afford to pick up prescriptions. Pt hands are crmping from cold. Hand warmers and gloves given. Encouraged electrolytes and warmth. Pt working on resources to afford medications.

## 2023-06-02 ENCOUNTER — Other Ambulatory Visit (HOSPITAL_COMMUNITY): Payer: Self-pay

## 2023-06-14 ENCOUNTER — Other Ambulatory Visit: Payer: Self-pay

## 2023-06-14 ENCOUNTER — Emergency Department (HOSPITAL_COMMUNITY)
Admission: EM | Admit: 2023-06-14 | Discharge: 2023-06-15 | Disposition: A | Discharge status: Other Acute Inpatient Hospital | Payer: MEDICAID | Attending: Emergency Medicine | Admitting: Emergency Medicine

## 2023-06-14 ENCOUNTER — Encounter (HOSPITAL_COMMUNITY): Payer: Self-pay | Admitting: *Deleted

## 2023-06-14 DIAGNOSIS — F1010 Alcohol abuse, uncomplicated: Secondary | ICD-10-CM | POA: Diagnosis not present

## 2023-06-14 DIAGNOSIS — R45851 Suicidal ideations: Secondary | ICD-10-CM | POA: Diagnosis not present

## 2023-06-14 DIAGNOSIS — Y900 Blood alcohol level of less than 20 mg/100 ml: Secondary | ICD-10-CM | POA: Insufficient documentation

## 2023-06-14 DIAGNOSIS — Z5900 Homelessness unspecified: Secondary | ICD-10-CM | POA: Insufficient documentation

## 2023-06-14 DIAGNOSIS — F332 Major depressive disorder, recurrent severe without psychotic features: Secondary | ICD-10-CM | POA: Diagnosis present

## 2023-06-14 DIAGNOSIS — F1420 Cocaine dependence, uncomplicated: Secondary | ICD-10-CM | POA: Diagnosis not present

## 2023-06-14 DIAGNOSIS — Z91040 Latex allergy status: Secondary | ICD-10-CM | POA: Diagnosis not present

## 2023-06-14 DIAGNOSIS — F329 Major depressive disorder, single episode, unspecified: Secondary | ICD-10-CM | POA: Insufficient documentation

## 2023-06-14 DIAGNOSIS — F32A Depression, unspecified: Secondary | ICD-10-CM | POA: Diagnosis present

## 2023-06-14 DIAGNOSIS — I10 Essential (primary) hypertension: Secondary | ICD-10-CM | POA: Diagnosis not present

## 2023-06-14 DIAGNOSIS — E119 Type 2 diabetes mellitus without complications: Secondary | ICD-10-CM | POA: Insufficient documentation

## 2023-06-14 DIAGNOSIS — Z20822 Contact with and (suspected) exposure to covid-19: Secondary | ICD-10-CM | POA: Diagnosis not present

## 2023-06-14 LAB — CBC WITH DIFFERENTIAL/PLATELET
Abs Immature Granulocytes: 0.06 10*3/uL (ref 0.00–0.07)
Basophils Absolute: 0.1 10*3/uL (ref 0.0–0.1)
Basophils Relative: 0 %
Eosinophils Absolute: 0.4 10*3/uL (ref 0.0–0.5)
Eosinophils Relative: 3 %
HCT: 35.6 % — ABNORMAL LOW (ref 39.0–52.0)
Hemoglobin: 10.9 g/dL — ABNORMAL LOW (ref 13.0–17.0)
Immature Granulocytes: 1 %
Lymphocytes Relative: 16 %
Lymphs Abs: 2 10*3/uL (ref 0.7–4.0)
MCH: 24.9 pg — ABNORMAL LOW (ref 26.0–34.0)
MCHC: 30.6 g/dL (ref 30.0–36.0)
MCV: 81.3 fL (ref 80.0–100.0)
Monocytes Absolute: 1.1 10*3/uL — ABNORMAL HIGH (ref 0.1–1.0)
Monocytes Relative: 9 %
Neutro Abs: 8.8 10*3/uL — ABNORMAL HIGH (ref 1.7–7.7)
Neutrophils Relative %: 71 %
Platelets: 429 10*3/uL — ABNORMAL HIGH (ref 150–400)
RBC: 4.38 MIL/uL (ref 4.22–5.81)
RDW: 18.8 % — ABNORMAL HIGH (ref 11.5–15.5)
WBC: 12.4 10*3/uL — ABNORMAL HIGH (ref 4.0–10.5)
nRBC: 0 % (ref 0.0–0.2)

## 2023-06-14 LAB — BASIC METABOLIC PANEL
Anion gap: 10 (ref 5–15)
BUN: 22 mg/dL — ABNORMAL HIGH (ref 6–20)
CO2: 21 mmol/L — ABNORMAL LOW (ref 22–32)
Calcium: 8.4 mg/dL — ABNORMAL LOW (ref 8.9–10.3)
Chloride: 108 mmol/L (ref 98–111)
Creatinine, Ser: 1.15 mg/dL (ref 0.61–1.24)
GFR, Estimated: >60 mL/min (ref >60)
Glucose, Bld: 171 mg/dL — ABNORMAL HIGH (ref 70–99)
Potassium: 4 mmol/L (ref 3.5–5.1)
Sodium: 139 mmol/L (ref 135–145)

## 2023-06-14 LAB — URINALYSIS, ROUTINE W REFLEX MICROSCOPIC
Bilirubin Urine: NEGATIVE
Glucose, UA: >=500 mg/dL — AB
Hgb urine dipstick: NEGATIVE
Ketones, ur: NEGATIVE mg/dL
Leukocytes,Ua: NEGATIVE
Nitrite: NEGATIVE
Protein, ur: NEGATIVE mg/dL
Specific Gravity, Urine: 1.024 (ref 1.005–1.030)
pH: 5 (ref 5.0–8.0)

## 2023-06-14 LAB — RESP PANEL BY RT-PCR (RSV, FLU A&B, COVID)  RVPGX2
Influenza A by PCR: NEGATIVE
Influenza B by PCR: NEGATIVE
Resp Syncytial Virus by PCR: NEGATIVE
SARS Coronavirus 2 by RT PCR: NEGATIVE

## 2023-06-14 LAB — ETHANOL: Alcohol, Ethyl (B): <10 mg/dL (ref <10)

## 2023-06-14 LAB — RAPID URINE DRUG SCREEN, HOSP PERFORMED
Amphetamines: NOT DETECTED
Barbiturates: NOT DETECTED
Benzodiazepines: POSITIVE — AB
Cocaine: POSITIVE — AB
Opiates: NOT DETECTED
Tetrahydrocannabinol: POSITIVE — AB

## 2023-06-14 LAB — SALICYLATE LEVEL: Salicylate Lvl: <7 mg/dL — ABNORMAL LOW (ref 7.0–30.0)

## 2023-06-14 LAB — ACETAMINOPHEN LEVEL: Acetaminophen (Tylenol), Serum: <10 ug/mL — ABNORMAL LOW (ref 10–30)

## 2023-06-14 MED ORDER — IBUPROFEN 400 MG PO TABS
600.0000 mg | ORAL_TABLET | Freq: Once | ORAL | Status: AC
  Administered 2023-06-14: 600 mg via ORAL
  Filled 2023-06-14: qty 1

## 2023-06-14 MED ORDER — THIAMINE MONONITRATE 100 MG PO TABS
100.0000 mg | ORAL_TABLET | Freq: Every day | ORAL | Status: DC
  Administered 2023-06-14 – 2023-06-15 (×2): 100 mg via ORAL
  Filled 2023-06-14 (×2): qty 1

## 2023-06-14 MED ORDER — FOLIC ACID 1 MG PO TABS
1.0000 mg | ORAL_TABLET | Freq: Every day | ORAL | Status: DC
  Administered 2023-06-14 – 2023-06-15 (×2): 1 mg via ORAL
  Filled 2023-06-14 (×2): qty 1

## 2023-06-14 MED ORDER — ONDANSETRON 4 MG PO TBDP
4.0000 mg | ORAL_TABLET | Freq: Once | ORAL | Status: AC
  Administered 2023-06-14: 4 mg via ORAL
  Filled 2023-06-14: qty 1

## 2023-06-14 MED ORDER — LORAZEPAM 1 MG PO TABS: 1.0000 mg | ORAL_TABLET | ORAL | Status: DC | PRN

## 2023-06-14 MED ORDER — LORAZEPAM 2 MG/ML IJ SOLN: 1.0000 mg | INTRAMUSCULAR | Status: DC | PRN

## 2023-06-14 MED ORDER — THIAMINE HCL 100 MG/ML IJ SOLN: 100.0000 mg | Freq: Every day | INTRAMUSCULAR | Status: DC

## 2023-06-14 MED ORDER — ADULT MULTIVITAMIN W/MINERALS CH
1.0000 | ORAL_TABLET | Freq: Every day | ORAL | Status: DC
  Administered 2023-06-14 – 2023-06-15 (×2): 1 via ORAL
  Filled 2023-06-14 (×2): qty 1

## 2023-06-14 NOTE — ED Provider Triage Note (Signed)
 Emergency Medicine Provider Triage Evaluation Note  Jeffery Wheeler , a 58 y.o. male  was evaluated in triage.  Pt complains of Flu Sx/SI w plan.  Review of Systems  Positive: SI, cough, congestion, body aches, chills, nausea Negative: Fever, emesis, CP, SHOB  Physical Exam  BP 127/75   Pulse 83   Temp 98.1 F (36.7 C)   Resp 18   SpO2 98%  Gen:   Awake, no distress   Resp:  Normal effort  MSK:   Moves extremities without difficulty  Other:    Medical Decision Making  Medically screening exam initiated at 7:40 PM.  Appropriate orders placed.  Jeffery Wheeler was informed that the remainder of the evaluation will be completed by another provider, this initial triage assessment does not replace that evaluation, and the importance of remaining in the ED until their evaluation is complete.  Pt states he plans to kill himself suicide by cop  Labs and imaging ordered   Jeffery Wheeler SAILOR, [place] 06/14/23 1943

## 2023-06-14 NOTE — ED Triage Notes (Signed)
 The pt reports that he is suicidal  and has been so for over a week  he was last  placed in a facility some time last year.   He has med bur has not taken any of it for 2 days.  He has not been to [facility] or behavorial health

## 2023-06-14 NOTE — ED Provider Notes (Signed)
 [facility] AT [facility] Provider Note   CSN: [id number] Arrival date & time: 06/14/23  1918     History  Chief Complaint  Patient presents with   Psychiatric Evaluation    Jeffery Wheeler is a 58 y.o. male.  58 year old male with a history of homelessness, alcohol use and cocaine use, diabetes, hypertension, and hyperlipidemia who presents to the emergency department suicidal ideations.  Patient reports that over the past few days he has been severely depressed.  Is having thoughts of taking pills (in triage was saying that he was thinking of suicide by cops).  Has had history of suicide attempts in the past.  Also had thoughts of harming someone over a drug dispute the other day.  No AVH.  Last used cocaine yesterday.  Drinks several 40 ounces of beer daily with last drink last night.        Home Medications Prior to Admission medications   Medication Sig Start Date End Date Taking? Authorizing Provider  ACETAMINOPHEN  PO Take 2 tablets by mouth 3 (three) times daily as needed (for neck pain). Patient not taking: Reported on 05/20/2023    [provider]  amLODipine  (NORVASC ) 10 MG tablet Take 1 tablet (10 mg total) by mouth daily. Patient not taking: Reported on 05/20/2023 02/27/22   Evelena Figures, MD  atorvastatin  (LIPITOR) 20 MG tablet Take 1 tablet (20 mg total) by mouth daily. Patient not taking: Reported on 05/20/2023 02/27/22   Evelena Figures, MD  baclofen  (LIORESAL ) 10 MG tablet Take 1 tablet (10 mg total) by mouth 3 (three) times daily. Patient not taking: Reported on 05/20/2023 04/19/23   Sumner Marval HERO, NP  diclofenac  (VOLTAREN ) 75 MG EC tablet Take 1 tablet (75 mg total) by mouth 2 (two) times daily. Patient not taking: Reported on 05/20/2023 04/19/23   Sumner Marval HERO, NP  loratadine -pseudoephedrine (CLARITIN -D 24 HOUR) 10-240 MG 24 hr tablet Take 1 tablet by mouth daily. Patient not taking: Reported on 05/20/2023 04/19/23    Sumner Marval HERO, NP  metFORMIN  (GLUCOPHAGE ) 1000 MG tablet Take by mouth. Patient not taking: Reported on 05/20/2023    [provider]  metFORMIN  (GLUCOPHAGE ) 850 MG tablet Take 1 tablet (850 mg total) by mouth 2 (two) times daily with a meal. Patient not taking: Reported on 05/20/2023 02/27/22   Evelena Figures, MD  sertraline  (ZOLOFT ) 50 MG tablet Take 1 tablet (50 mg total) by mouth daily. Patient not taking: Reported on 05/20/2023 12/02/22   Atway, Rayann N, DO      Allergies    Latex and Shellfish-derived products    Review of Systems   Review of Systems  Physical Exam Updated Vital Signs BP (!) 153/70   Pulse 77   Temp 97.6 F (36.4 C) (Oral)   Resp 20   Ht 5' 7 (1.702 m)   Wt 84 kg   SpO2 100%   BMI 29.00 kg/m  Physical Exam Vitals and nursing note reviewed.  Constitutional:      General: He is not in acute distress.    Appearance: He is well-developed.  HENT:     Head: Normocephalic and atraumatic.     Right Ear: External ear normal.     Left Ear: External ear normal.     Nose: Nose normal.  Eyes:     Extraocular Movements: Extraocular movements intact.     Conjunctiva/sclera: Conjunctivae normal.     Pupils: Pupils are equal, round, and reactive to light.  Cardiovascular:  Rate and Rhythm: Normal rate and regular rhythm.     Heart sounds: Normal heart sounds.  Pulmonary:     Effort: Pulmonary effort is normal. No respiratory distress.     Breath sounds: Normal breath sounds.  Musculoskeletal:     Cervical back: Normal range of motion and neck supple.     Right lower leg: No edema.     Left lower leg: No edema.  Skin:    General: Skin is warm and dry.  Neurological:     Mental Status: He is alert. Mental status is at baseline.  Psychiatric:        Mood and Affect: Mood normal.        Behavior: Behavior normal.     ED Results / Procedures / Treatments   Labs (all labs ordered are listed, but only abnormal results are displayed) Labs  Reviewed  URINALYSIS, ROUTINE W REFLEX MICROSCOPIC - Abnormal; Notable for the following components:      Result Value   APPearance HAZY (*)    Glucose, UA >=500 (*)    Bacteria, UA RARE (*)    All other components within normal limits  ACETAMINOPHEN  LEVEL - Abnormal; Notable for the following components:   Acetaminophen  (Tylenol ), Serum <10 (*)    All other components within normal limits  BASIC METABOLIC PANEL - Abnormal; Notable for the following components:   CO2 21 (*)    Glucose, Bld 171 (*)    BUN 22 (*)    Calcium  8.4 (*)    All other components within normal limits  SALICYLATE LEVEL - Abnormal; Notable for the following components:   Salicylate Lvl <7.0 (*)    All other components within normal limits  CBC WITH DIFFERENTIAL/PLATELET - Abnormal; Notable for the following components:   WBC 12.4 (*)    Hemoglobin 10.9 (*)    HCT 35.6 (*)    MCH 24.9 (*)    RDW 18.8 (*)    Platelets 429 (*)    Neutro Abs 8.8 (*)    Monocytes Absolute 1.1 (*)    All other components within normal limits  RAPID URINE DRUG SCREEN, HOSP PERFORMED - Abnormal; Notable for the following components:   Cocaine POSITIVE (*)    Benzodiazepines POSITIVE (*)    Tetrahydrocannabinol POSITIVE (*)    All other components within normal limits  RESP PANEL BY RT-PCR (RSV, FLU A&B, COVID)  RVPGX2  ETHANOL    EKG None  Radiology No results found.  Procedures Procedures    Medications Ordered in ED Medications  LORazepam  (ATIVAN ) tablet 1-4 mg (has no administration in time range)    Or  LORazepam  (ATIVAN ) injection 1-4 mg (has no administration in time range)  thiamine  (VITAMIN B1) tablet 100 mg (100 mg Oral Given 06/15/23 0948)    Or  thiamine  (VITAMIN B1) injection 100 mg ( Intravenous See Alternative 06/15/23 0948)  folic acid  (FOLVITE ) tablet 1 mg (1 mg Oral Given 06/15/23 0948)  multivitamin with minerals tablet 1 tablet (1 tablet Oral Given 06/15/23 0948)  amLODipine  (NORVASC ) tablet 10 mg (10  mg Oral Given 06/15/23 0956)  sertraline  (ZOLOFT ) tablet 50 mg (50 mg Oral Given 06/15/23 0956)  metFORMIN  (GLUCOPHAGE ) tablet 850 mg (has no administration in time range)  hydrOXYzine  (ATARAX ) tablet 25 mg (has no administration in time range)  ondansetron  (ZOFRAN -ODT) disintegrating tablet 4 mg (4 mg Oral Given 06/14/23 2122)  ibuprofen  (ADVIL ) tablet 600 mg (600 mg Oral Given 06/14/23 2122)    ED Course/  Medical Decision Making/ A&P                                 Medical Decision Making Risk OTC drugs. Prescription drug management.   Jeffery Wheeler is a 58 y.o. male with comorbidities that complicate the patient evaluation including homelessness, alcohol use and cocaine use, diabetes, hypertension, and hyperlipidemia who presents to the emergency department suicidal ideations.   Initial Ddx:  Suicidal ideation, homicidal ideation, depression, substance use, alcohol withdrawal, homelessness/secondary gain  MDM/Course:  Patient resents emergency department with suicidal ideations.  Did have some varying reports of what his plan was for killing himself.  He told me that he was thinking of taking pills.  Told the provider that he was thinking of committing suicide by cops.  Patient and this means getting into an altercation with the police were he might try to have them kill him.  Does report heavy alcohol use but does not appear to be in withdrawals at this time.  He is placed on CIWA and had psychiatric clearance labs drawn.  They were unremarkable.  This point in time he is cleared for psychiatry evaluation.  Upon re-evaluation he remained stable without any significant signs of withdrawal.  Could potentially be a component of secondary gain to obtain housing but do feel that he needs to be formally evaluated by psychiatry given his suicidal ideation.  This patient presents to the ED for concern of complaints listed in HPI, this involves an extensive number of treatment options, and is a  complaint that carries with it a high risk of complications and morbidity. Disposition including potential need for admission considered.   Dispo: [facility]  Records reviewed Outpatient Clinic Notes The following labs were independently interpreted: Chemistry and show no acute abnormality I personally reviewed and interpreted the pt's EKG: see above for interpretation  I have reviewed the patients home medications and made adjustments as needed Consults: TTS Social Determinants of health:  Homelessness  Portions of this note were generated with Scientist, clinical (histocompatibility and immunogenetics). Dictation errors may occur despite best attempts at proofreading.     Final Clinical Impression(s) / ED Diagnoses Final diagnoses:  Depression, unspecified depression type  Suicidal ideation  Alcohol abuse  Homelessness    Rx / DC Orders ED Discharge Orders     None         Yolande Lamar BROCKS, MD 06/15/23 1320

## 2023-06-14 NOTE — BH Assessment (Signed)
 Clinician messaged Tressie Fryer. Kaye Parsons, RN: Genette Kent. It's Trey with TTS. Is the pt able to engage in the assessment, if so the pt will need to be placed in a private room. Is the pt under IVC? Also is the pt medically cleared?   Clinician awaiting response.    Rosi Converse, MS, [facility], [facility] Triage Specialist [phone]

## 2023-06-15 ENCOUNTER — Encounter (HOSPITAL_COMMUNITY): Payer: Self-pay

## 2023-06-15 ENCOUNTER — Encounter (HOSPITAL_COMMUNITY): Payer: Self-pay | Admitting: Psychiatry

## 2023-06-15 ENCOUNTER — Inpatient Hospital Stay (HOSPITAL_COMMUNITY)
Admission: AD | Admit: 2023-06-15 | Discharge: 2023-06-23 | DRG: 881 | Disposition: A | Discharge status: Home / Self Care | Payer: MEDICAID | Source: Intra-hospital | Attending: Psychiatry | Admitting: Psychiatry

## 2023-06-15 DIAGNOSIS — F329 Major depressive disorder, single episode, unspecified: Secondary | ICD-10-CM | POA: Diagnosis present

## 2023-06-15 DIAGNOSIS — Z5941 Food insecurity: Secondary | ICD-10-CM

## 2023-06-15 DIAGNOSIS — F332 Major depressive disorder, recurrent severe without psychotic features: Secondary | ICD-10-CM

## 2023-06-15 DIAGNOSIS — Z7984 Long term (current) use of oral hypoglycemic drugs: Secondary | ICD-10-CM

## 2023-06-15 DIAGNOSIS — Z560 Unemployment, unspecified: Secondary | ICD-10-CM | POA: Diagnosis not present

## 2023-06-15 DIAGNOSIS — Z5900 Homelessness unspecified: Secondary | ICD-10-CM

## 2023-06-15 DIAGNOSIS — F419 Anxiety disorder, unspecified: Secondary | ICD-10-CM | POA: Diagnosis present

## 2023-06-15 DIAGNOSIS — Z833 Family history of diabetes mellitus: Secondary | ICD-10-CM

## 2023-06-15 DIAGNOSIS — Z9151 Personal history of suicidal behavior: Secondary | ICD-10-CM | POA: Diagnosis not present

## 2023-06-15 DIAGNOSIS — F17210 Nicotine dependence, cigarettes, uncomplicated: Secondary | ICD-10-CM | POA: Diagnosis present

## 2023-06-15 DIAGNOSIS — M1990 Unspecified osteoarthritis, unspecified site: Secondary | ICD-10-CM | POA: Diagnosis present

## 2023-06-15 DIAGNOSIS — Z79899 Other long term (current) drug therapy: Secondary | ICD-10-CM

## 2023-06-15 DIAGNOSIS — F1410 Cocaine abuse, uncomplicated: Secondary | ICD-10-CM | POA: Diagnosis present

## 2023-06-15 DIAGNOSIS — E119 Type 2 diabetes mellitus without complications: Secondary | ICD-10-CM | POA: Diagnosis present

## 2023-06-15 DIAGNOSIS — E785 Hyperlipidemia, unspecified: Secondary | ICD-10-CM | POA: Diagnosis present

## 2023-06-15 DIAGNOSIS — F1010 Alcohol abuse, uncomplicated: Secondary | ICD-10-CM | POA: Diagnosis present

## 2023-06-15 DIAGNOSIS — R45851 Suicidal ideations: Secondary | ICD-10-CM | POA: Diagnosis present

## 2023-06-15 DIAGNOSIS — Z5982 Transportation insecurity: Secondary | ICD-10-CM | POA: Diagnosis not present

## 2023-06-15 DIAGNOSIS — I10 Essential (primary) hypertension: Secondary | ICD-10-CM | POA: Diagnosis present

## 2023-06-15 DIAGNOSIS — Z5948 Other specified lack of adequate food: Secondary | ICD-10-CM | POA: Diagnosis not present

## 2023-06-15 LAB — GLUCOSE, CAPILLARY: Glucose-Capillary: 163 mg/dL — ABNORMAL HIGH (ref 70–99)

## 2023-06-15 MED ORDER — ACETAMINOPHEN 325 MG PO TABS
650.0000 mg | ORAL_TABLET | Freq: Four times a day (QID) | ORAL | Status: DC | PRN
  Administered 2023-06-15 – 2023-06-22 (×8): 650 mg via ORAL
  Filled 2023-06-15 (×9): qty 2

## 2023-06-15 MED ORDER — HALOPERIDOL 5 MG PO TABS: 5.0000 mg | ORAL_TABLET | Freq: Three times a day (TID) | ORAL | Status: DC | PRN

## 2023-06-15 MED ORDER — ACETAMINOPHEN 325 MG PO TABS
650.0000 mg | ORAL_TABLET | Freq: Four times a day (QID) | ORAL | Status: DC | PRN
  Administered 2023-06-15: 650 mg via ORAL
  Filled 2023-06-15: qty 2

## 2023-06-15 MED ORDER — AMLODIPINE BESYLATE 5 MG PO TABS
10.0000 mg | ORAL_TABLET | Freq: Every day | ORAL | Status: DC
  Administered 2023-06-15: 10 mg via ORAL
  Filled 2023-06-15: qty 2

## 2023-06-15 MED ORDER — ADULT MULTIVITAMIN W/MINERALS CH
1.0000 | ORAL_TABLET | Freq: Every day | ORAL | Status: DC
  Administered 2023-06-16 – 2023-06-22 (×7): 1 via ORAL
  Filled 2023-06-15 (×9): qty 1

## 2023-06-15 MED ORDER — SERTRALINE HCL 50 MG PO TABS
50.0000 mg | ORAL_TABLET | Freq: Every day | ORAL | Status: DC
  Administered 2023-06-16: 50 mg via ORAL
  Filled 2023-06-15 (×2): qty 1

## 2023-06-15 MED ORDER — ALUM & MAG HYDROXIDE-SIMETH 200-200-20 MG/5ML PO SUSP: 30.0000 mL | ORAL | Status: DC | PRN

## 2023-06-15 MED ORDER — LORAZEPAM 2 MG/ML IJ SOLN: 1.0000 mg | INTRAMUSCULAR | Status: AC | PRN

## 2023-06-15 MED ORDER — HALOPERIDOL LACTATE 5 MG/ML IJ SOLN: 10.0000 mg | Freq: Three times a day (TID) | INTRAMUSCULAR | Status: DC | PRN

## 2023-06-15 MED ORDER — METFORMIN HCL 850 MG PO TABS
850.0000 mg | ORAL_TABLET | Freq: Two times a day (BID) | ORAL | Status: DC
  Filled 2023-06-15: qty 1

## 2023-06-15 MED ORDER — HYDROXYZINE HCL 25 MG PO TABS: 25.0000 mg | ORAL_TABLET | Freq: Three times a day (TID) | ORAL | Status: DC | PRN

## 2023-06-15 MED ORDER — FOLIC ACID 1 MG PO TABS
1.0000 mg | ORAL_TABLET | Freq: Every day | ORAL | Status: DC
  Administered 2023-06-16 – 2023-06-22 (×7): 1 mg via ORAL
  Filled 2023-06-15 (×9): qty 1

## 2023-06-15 MED ORDER — HYDROXYZINE HCL 25 MG PO TABS
25.0000 mg | ORAL_TABLET | Freq: Three times a day (TID) | ORAL | Status: DC | PRN
  Administered 2023-06-17 – 2023-06-21 (×4): 25 mg via ORAL
  Filled 2023-06-15: qty 1
  Filled 2023-06-15: qty 20
  Filled 2023-06-15 (×5): qty 1

## 2023-06-15 MED ORDER — PNEUMOCOCCAL 20-VAL CONJ VACC 0.5 ML IM SUSY
0.5000 mL | PREFILLED_SYRINGE | INTRAMUSCULAR | Status: DC
  Filled 2023-06-15: qty 0.5

## 2023-06-15 MED ORDER — LORAZEPAM 2 MG/ML IJ SOLN: 2.0000 mg | Freq: Three times a day (TID) | INTRAMUSCULAR | Status: DC | PRN

## 2023-06-15 MED ORDER — AMLODIPINE BESYLATE 10 MG PO TABS
10.0000 mg | ORAL_TABLET | Freq: Every day | ORAL | Status: DC
  Administered 2023-06-16 – 2023-06-22 (×7): 10 mg via ORAL
  Filled 2023-06-15 (×3): qty 1
  Filled 2023-06-15: qty 14
  Filled 2023-06-15 (×6): qty 1

## 2023-06-15 MED ORDER — VITAMIN B-1 100 MG PO TABS
100.0000 mg | ORAL_TABLET | Freq: Every day | ORAL | Status: DC
  Administered 2023-06-16 – 2023-06-22 (×6): 100 mg via ORAL
  Filled 2023-06-15 (×9): qty 1

## 2023-06-15 MED ORDER — DIPHENHYDRAMINE HCL 50 MG/ML IJ SOLN: 50.0000 mg | Freq: Three times a day (TID) | INTRAMUSCULAR | Status: DC | PRN

## 2023-06-15 MED ORDER — HALOPERIDOL LACTATE 5 MG/ML IJ SOLN: 5.0000 mg | Freq: Three times a day (TID) | INTRAMUSCULAR | Status: DC | PRN

## 2023-06-15 MED ORDER — LORAZEPAM 1 MG PO TABS: 1.0000 mg | ORAL_TABLET | ORAL | Status: AC | PRN

## 2023-06-15 MED ORDER — METFORMIN HCL 850 MG PO TABS
850.0000 mg | ORAL_TABLET | Freq: Two times a day (BID) | ORAL | Status: DC
  Administered 2023-06-15 – 2023-06-22 (×15): 850 mg via ORAL
  Filled 2023-06-15 (×10): qty 1
  Filled 2023-06-15: qty 28
  Filled 2023-06-15 (×4): qty 1
  Filled 2023-06-15: qty 28
  Filled 2023-06-15 (×4): qty 1

## 2023-06-15 MED ORDER — DIPHENHYDRAMINE HCL 25 MG PO CAPS: 50.0000 mg | ORAL_CAPSULE | Freq: Three times a day (TID) | ORAL | Status: DC | PRN

## 2023-06-15 MED ORDER — THIAMINE HCL 100 MG/ML IJ SOLN
100.0000 mg | Freq: Every day | INTRAMUSCULAR | Status: DC
  Administered 2023-06-18: 100 mg via INTRAVENOUS
  Filled 2023-06-15: qty 2

## 2023-06-15 MED ORDER — TRAZODONE HCL 50 MG PO TABS
50.0000 mg | ORAL_TABLET | Freq: Every evening | ORAL | Status: DC | PRN
  Administered 2023-06-16 – 2023-06-20 (×5): 50 mg via ORAL
  Filled 2023-06-15 (×4): qty 1

## 2023-06-15 MED ORDER — SERTRALINE HCL 50 MG PO TABS
50.0000 mg | ORAL_TABLET | Freq: Every day | ORAL | Status: DC
  Administered 2023-06-15: 50 mg via ORAL
  Filled 2023-06-15: qty 1

## 2023-06-15 MED ORDER — MAGNESIUM HYDROXIDE 400 MG/5ML PO SUSP: 30.0000 mL | Freq: Every day | ORAL | Status: DC | PRN

## 2023-06-15 NOTE — Progress Notes (Signed)
   06/15/23 1600  Psych Admission Type (Psych Patients Only)  Admission Status Voluntary  Psychosocial Assessment  Patient Complaints Depression;Substance abuse;Hopelessness;Helplessness;Self-harm thoughts;Sadness  Eye Contact Brief  Facial Expression Flat;Sad  Affect Depressed;Flat  Speech Soft;Slow  Interaction Cautious  Motor Activity Slow;Unsteady  Appearance/Hygiene In scrubs  Behavior Characteristics Cooperative;Guarded  Mood Depressed;Helpless  Thought Process  Coherency WDL  Content WDL  Delusions None reported or observed  Perception Hallucinations  Hallucination Visual  Judgment WDL  Confusion None  Danger to Self  Current suicidal ideation? Plan  Description of Suicide Plan overdose on medications  Self-Injurious Behavior No self-injurious ideation or behavior indicators observed or expressed   Agreement Not to Harm Self Yes  Description of Agreement verbal contract for safety  Danger to Others  Danger to Others None reported or observed

## 2023-06-15 NOTE — Group Note (Signed)
 Date:  06/16/2023 Time:  11:32 AM  Group Topic/Focus:  Emotional Education:   The focus of this group is to discuss what feelings/emotions are, and how they are experienced. Managing Feelings:   The focus of this group is to identify what feelings patients have difficulty handling and develop a plan to handle them in a healthier way upon discharge.    Participation Level:  Did Not Attend  Participation Quality:   n/a  Affect:   n/a  Cognitive:   n/a  Insight: None  Engagement in Group:   n/a  Modes of Intervention:   n/a  Additional Comments:   Pt did not attend.  Addison HERO Avrianna Smart 06/16/2023, 11:32 AM

## 2023-06-15 NOTE — ED Notes (Signed)
 Secretary faxing consent form to [facility].

## 2023-06-15 NOTE — Consult Note (Signed)
 [facility] Psychiatric Consult Follow-up  Patient Name: .Jeffery Wheeler  MRN: [id number]  DOB: Oct 14, 1965  Consult Order details:  Orders (From admission, onward)     Start     Ordered   06/14/23 2139  CONSULT TO CALL ACT TEAM       Ordering Provider: Yolande Lamar BROCKS, MD  Provider:  (Not yet assigned)  Question:  Reason for Consult?  Answer:  si   06/14/23 2139             Mode of Visit: Tele-visit Virtual Statement:TELE PSYCHIATRY ATTESTATION & CONSENT As the provider for this telehealth consult, I attest that I verified the patient's identity using two separate identifiers, introduced myself to the patient, provided my credentials, disclosed my location, and performed this encounter via a HIPAA-compliant, real-time, face-to-face, two-way, interactive audio and video platform and with the full consent and agreement of the patient (or guardian as applicable.) Patient physical location: Jeffery Wheeler. Telehealth provider physical location: home office in state of [place].   Video start time: 1010 Video end time: 1045    Psychiatry Consult Evaluation  Service Date: June 15, 2023 LOS:  LOS: 0 days  Chief Complaint feeling worthless and suicidal  Primary Psychiatric Diagnoses  Major Depressive Disorder Cocaine use disorder, dependence   Assessment  Jeffery Wheeler is a 58 y.o. male admitted: Presented to the ED on 06/14/2023  7:21 PM for suicidal ideations with a plan to overdose on his medications. He carries the psychiatric diagnoses of major depressive disorder, with cocaine abuse and has a past medical history of  hypertension.   His current presentation of feeling worthless, and sadness is most consistent with depression. He meets criteria for inpatient psychiatric admission based on feeling of worthlessness and suicidal ideation with with a plan to overdose on his medications.  Current outpatient psychotropic medications include Zoloft . He was non compliant with medications  prior to admission as evidenced by patient stating he did not take his medications. On initial examination, patient is cooperative but appears sad. Please see plan below for detailed recommendations.   Diagnoses:  Active Hospital problems: Principal Problem:   MDD (major depressive disorder), recurrent severe, without psychosis (HCC) Active Problems:   Cocaine use disorder, severe, dependence (HCC)    Plan   ## Psychiatric Medication Recommendations:  Continue Zoloft  50 mg daily for depression  ## Medical Decision Making Capacity:  Patient is his own legal guardian.  ## Further Work-up:  -- No further work up needed at this time  EKG or UDS -- most recent EKG on 06/15/23 had QtC of 429 -- Pertinent labwork reviewed earlier this admission includes: CMP, BNP, EKG, UDS   ## Disposition:-- We recommend inpatient psychiatric hospitalization. Patient is under voluntary admission status at this time; please IVC if attempts to leave hospital.   ## Behavioral / Environmental: -To minimize splitting of staff, assign one staff person to communicate all information from the team when feasible.    ## Safety and Observation Level:  - Based on my clinical evaluation, I estimate the patient to be at moderate risk of self harm in the current setting. - At this time, we recommend  routine. This decision is based on my review of the chart including patient's history and current presentation, interview of the patient, mental status examination, and consideration of suicide risk including evaluating suicidal ideation, plan, intent, suicidal or self-harm behaviors, risk factors, and protective factors. This judgment is based on our ability to directly address suicide  risk, implement suicide prevention strategies, and develop a safety plan while the patient is in the clinical setting. Please contact our team if there is a concern that risk level has changed.  CSSR Risk Category:C-SSRS RISK CATEGORY: High  Risk  Suicide Risk Assessment: Patient has following modifiable risk factors for suicide: active suicidal ideation, untreated depression, social isolation, and medication noncompliance, which we are addressing by recommending inpatient psychiatric admission. Patient has following non-modifiable or demographic risk factors for suicide: male gender and psychiatric hospitalization Patient has the following protective factors against suicide: Supportive family and Supportive friends  Thank you for this consult request. Recommendations have been communicated to the primary team.  We will recommend inpatient psychiatric admission at this time.   Jeffery Wheeler, PMHNP       History of Present Illness  Relevant Aspects of Hospital ED Course:  Admitted on 06/14/2023 for suicidal ideations with a plan to overdose on his medications.   Patient Report:  On reassessment today, the patient is laying in bed with blankets pulled up over his head. He is calm and cooperative during this assessment. His appearance is appropriate for environment. His eye contact is minimal.  Speech is clear and coherent, normal pace and decreased volume. He is alert and oriented x4 to person, place, time, and situation. He reports his mood is depressed.  Affect is congruent with mood.  Thought process is coherent.  Thought content is within normal limits. He denies auditory and visual hallucinations.  No indication that he is responding to internal stimuli during this assessment.  No delusions elicited during this assessment.  He denies suicidal ideations.  She denies homicidal ideations. Appetite and sleep are fair.  Patient endorses depressed mood, insomnia, anhedonia, fatigue, poor concentration, hopelessness, worthlessness. Endorses ongoing active suicidal ideation with intent. Denies symptoms consistent with mania/hypomania, paranoia, auditory and visual hallucinations, homicidal ideation.   Psych ROS:  Depression:  Positive Anxiety:  Positive Mania (lifetime and current): Denies Psychosis: (lifetime and current): Denies  Collateral information:  Contacted None  Review of Systems  Psychiatric/Behavioral:  Positive for depression, substance abuse and suicidal ideas.      Psychiatric and Social History  Psychiatric History:  Information collected from patient and chart review   Prev Dx/Sx: Major Depressive Disorder  Current Psych Provider: None Home Meds (current): Zoloft  Previous Med Trials: Unknown  Therapy: None  Prior Psych Hospitalization: Yes   Prior Self Harm: Unknown  Prior Violence: Unknown   Family Psych History: Denies Family Hx suicide: Denies  Social History:  Developmental Hx: Patient appears appropriate for age Educational Hx: Patient graduated high school Occupational Hx: Currently unemployed Legal Hx: Denies Living Situation: Homeless Spiritual Hx: [employer] Access to weapons/lethal means: Denies  Substance History Alcohol: Denies Type of alcohol denies Last Drink denies Number of drinks per day denies History of alcohol withdrawal seizures denies History of DT's denies Tobacco: Yes Illicit drugs: Yes Prescription drug abuse: Denies Rehab hx: Denies  Exam Findings  Physical Exam:  Vital Signs:  Temp:  [98 F (36.7 C)-98.1 F (36.7 C)] 98.1 F (36.7 C) (02/09 0752) Pulse Rate:  [64-83] 64 (02/09 0956) Resp:  [16-18] 18 (02/09 0956) BP: (117-127)/(70-77) 127/71 (02/09 0956) SpO2:  [98 %] 98 % (02/09 0956) Weight:  [84 kg] 84 kg (02/08 1945) Blood pressure 127/71, pulse 64, temperature 98.1 F (36.7 C), resp. rate 18, height 5' 7 (1.702 m), weight 84 kg, SpO2 98%. Body mass index is 29 kg/m.  Physical Exam Vitals and nursing  note reviewed. Exam conducted with a chaperone present.  Psychiatric:        Attention and Perception: Attention normal.        Mood and Affect: Mood is depressed. Affect is flat.        Speech: Speech normal.         Behavior: Behavior is cooperative.        Thought Content: Thought content includes suicidal ideation. Thought content includes suicidal plan.        Cognition and Memory: Memory normal.        Judgment: Judgment is inappropriate.     Mental Status Exam: General Appearance: Casual  Orientation:  Full (Time, Place, and Person)  Memory:  Fair  Concentration:  Concentration: Fair and Attention Span: Fair  Recall:  Fair  Attention  Fair  Eye Contact:  Fair  Speech:  Clear and Coherent  Language:  Good  Volume:  Decreased  Mood: depressed  Affect:  Congruent  Thought Process:  Coherent  Thought Content:  WDL  Suicidal Thoughts:  Yes.  with intent/plan  Homicidal Thoughts:  Yes.  without intent/plan  Judgement:  Fair  Insight:  Fair  Psychomotor Activity:  Normal  Akathisia:  No  Fund of Knowledge:  Fair      Assets:  Manufacturing Systems Engineer Desire for Improvement Physical Health Social Support  Cognition:  WNL  ADL's:  Intact  AIMS (if indicated):        Other History   These have been pulled in through the EMR, reviewed, and updated if appropriate.  Family History:  The patient's family history includes Diabetes type II in his maternal aunt, paternal grandmother, and sister.  Medical History: Past Medical History:  Diagnosis Date   Eczema    HLD (hyperlipidemia)    HTN (hypertension)    Type 2 diabetes mellitus (HCC)     Surgical History: Past Surgical History:  Procedure Laterality Date   CERVICAL SPINE SURGERY  2018   right shoulder  1991     Medications:   Current Facility-Administered Medications:    amLODipine  (NORVASC ) tablet 10 mg, 10 mg, Oral, Daily, Steinl, Kevin, MD, 10 mg at 06/15/23 9043   folic acid  (FOLVITE ) tablet 1 mg, 1 mg, Oral, Daily, Yolande Lamar BROCKS, MD, 1 mg at 06/15/23 9051   hydrOXYzine  (ATARAX ) tablet 25 mg, 25 mg, Oral, TID PRN, Motley-Mangrum, Tiffanyann Deroo A, PMHNP   LORazepam  (ATIVAN ) tablet 1-4 mg, 1-4 mg, Oral, Q1H PRN **OR**  LORazepam  (ATIVAN ) injection 1-4 mg, 1-4 mg, Intravenous, Q1H PRN, Yolande Lamar BROCKS, MD   metFORMIN  (GLUCOPHAGE ) tablet 850 mg, 850 mg, Oral, BID WC, Steinl, Kevin, MD   multivitamin with minerals tablet 1 tablet, 1 tablet, Oral, Daily, Yolande Lamar BROCKS, MD, 1 tablet at 06/15/23 9051   sertraline  (ZOLOFT ) tablet 50 mg, 50 mg, Oral, Daily, Steinl, Kevin, MD, 50 mg at 06/15/23 9043   thiamine  (VITAMIN B1) tablet 100 mg, 100 mg, Oral, Daily, 100 mg at 06/15/23 0948 **OR** thiamine  (VITAMIN B1) injection 100 mg, 100 mg, Intravenous, Daily, Yolande Lamar BROCKS, MD  Current Outpatient Medications:    ACETAMINOPHEN  PO, Take 2 tablets by mouth 3 (three) times daily as needed (for neck pain). (Patient not taking: Reported on 05/20/2023), Disp: , Rfl:    amLODipine  (NORVASC ) 10 MG tablet, Take 1 tablet (10 mg total) by mouth daily. (Patient not taking: Reported on 05/20/2023), Disp: 30 tablet, Rfl: 0   atorvastatin  (LIPITOR) 20 MG tablet, Take 1 tablet (20 mg total) by  mouth daily. (Patient not taking: Reported on 05/20/2023), Disp: 30 tablet, Rfl: 0   baclofen  (LIORESAL ) 10 MG tablet, Take 1 tablet (10 mg total) by mouth 3 (three) times daily. (Patient not taking: Reported on 05/20/2023), Disp: 30 each, Rfl: 0   diclofenac  (VOLTAREN ) 75 MG EC tablet, Take 1 tablet (75 mg total) by mouth 2 (two) times daily. (Patient not taking: Reported on 05/20/2023), Disp: 60 tablet, Rfl: 0   loratadine -pseudoephedrine (CLARITIN -D 24 HOUR) 10-240 MG 24 hr tablet, Take 1 tablet by mouth daily. (Patient not taking: Reported on 05/20/2023), Disp: 30 tablet, Rfl: 0   metFORMIN  (GLUCOPHAGE ) 1000 MG tablet, Take by mouth. (Patient not taking: Reported on 05/20/2023), Disp: , Rfl:    metFORMIN  (GLUCOPHAGE ) 850 MG tablet, Take 1 tablet (850 mg total) by mouth 2 (two) times daily with a meal. (Patient not taking: Reported on 05/20/2023), Disp: 60 tablet, Rfl: 0   sertraline  (ZOLOFT ) 50 MG tablet, Take 1 tablet (50 mg total) by mouth  daily. (Patient not taking: Reported on 05/20/2023), Disp: 30 tablet, Rfl: 0  Allergies: Allergies  Allergen Reactions   Latex Rash and Other (See Comments)    Skin breaks out   Shellfish-Derived Products Rash    Rangel Echeverri MOTLEY-MANGRUM, PMHNP

## 2023-06-15 NOTE — ED Notes (Signed)
Safe transport contacted for transport 

## 2023-06-15 NOTE — Plan of Care (Signed)

## 2023-06-15 NOTE — ED Notes (Signed)
 Pt taken out to safe transport vehicle by this RN with all belongings and paperwork given to transporter.

## 2023-06-15 NOTE — ED Provider Notes (Addendum)
 Emergency Medicine Observation Re-evaluation Note  Craige Patel is a 58 y.o. male, seen on rounds today.  Pt initially presented to the ED for complaints of substance use disorder and related mood disorder. Pt notes stress related to housing instability and SUD. No trouble sleeping. Normal appetite.  No new c/o this AM.   Physical Exam  BP 117/70 (BP Location: Right Arm)   Pulse 65   Temp 98.1 F (36.7 C)   Resp 16   Ht 1.702 m (5' 7)   Wt 84 kg   SpO2 98%   BMI 29.00 kg/m  Physical Exam General: resting, easily aroused.  Cardiac: regular rate.  Lungs: breathing comfortably. Psych: normal mood/affect. No desire or plan to harm self. Indicates feels stressed/depressed. Pt does not appear to be responding to internal stimuli - no delusions, hallucinations or acute psychosis noted.   ED Course / MDM    I have reviewed the labs performed to date as well as medications administered while in observation.  Recent changes in the last 24 hours include ED obs, reassessment.   Plan  Currently [facility] team is working on psychiatric placement.     Bernard Drivers, MD 06/15/23 [phone]   RN/BH team indicate patient accepted to [facility], DR Evelena, bed ready..  Pt is awake and alert. No tremor or shakes. Vitals stable.  Pt currently appears stable for transport/transfer.   Bernard Drivers, MD 06/15/23 1536

## 2023-06-15 NOTE — Tx Team (Signed)
 Initial Treatment Plan 06/15/2023 4:59 PM Jeffery Wheeler FMW:[id number]    PATIENT STRESSORS: Financial difficulties   Substance abuse     PATIENT STRENGTHS: Capable of independent living  Communication skills  General fund of knowledge    PATIENT IDENTIFIED PROBLEMS: I have a bad alcohol and drug problem  I I want to kill myself by taking all of my medications.                   DISCHARGE CRITERIA:  Improved stabilization in mood, thinking, and/or behavior Motivation to continue treatment in a less acute level of care Reduction of life-threatening or endangering symptoms to within safe limits  PRELIMINARY DISCHARGE PLAN: Attend 12-step recovery group Outpatient therapy  PATIENT/FAMILY INVOLVEMENT: This treatment plan has been presented to and reviewed with the patient, Jeffery Wheeler.  The patient and family have been given the opportunity to ask questions and make suggestions.  Annalee Larch, RN 06/15/2023, 4:59 PM

## 2023-06-15 NOTE — [facility] Group Notes (Signed)
 [facility] Group Notes:  (Nursing/MHT/Case Management/Adjunct)  Date:  06/15/2023  Time:  8:48 PM  Type of Therapy:   Wrap-up group  Participation Level:  Did Not Attend  Participation Quality:    Affect:    Cognitive:    Insight:    Engagement in Group:    Modes of Intervention:    Summary of Progress/Problems: Pt refused to attend group.  Grayce LITTIE Essex 06/15/2023, 8:48 PM

## 2023-06-15 NOTE — Progress Notes (Addendum)
 [facility] Admission Note:  Patient is a 58 y.o. M who presented voluntarily to [facility] for suicidal ideation with a plan to overdose on medication. Patient also states he needs help with his substance use of cocaine and etoh, drinking six 40 ounces of beer a day. Patient is homeless and had been trying to get into [facility] for detox but was kicked out because his blood glucose was too high so he felt like just giving up. Patient has a history of 1 prior suicide attempt in 2023.   On admission patient endorses SI with the same plan, though, while at [facility], contracts for safety. Patient denies HI. Patient endorses VH in the form of shadows usually when using cocaine. Last occurrence of VH was yesterday. Patient denies AH on admission. CIWA is 2 on admission for headache. Patient packet was provided and consents were signed. Patient's belongings were searched per unit policy and contraband items were placed in locker #40. Skin was assessed with Mia, MHT and found to be WNL. Patient was observed to be walking with difficulty due to pain in the right knee from arthritis and was put on fall precautions. Patient was oriented to the unit, provided a meal and given opportunity to make phone calls. Q 15 minute checks were initiated for safety.

## 2023-06-15 NOTE — BH Assessment (Signed)
 Comprehensive Clinical Assessment (CCA) Note  06/15/2023 Jeffery Wheeler [id number]  Disposition: Kathryne Show, NP recommends pt to be observed and reassessed by psychiatry. Disposition discussed with Jeffery Wheeler. Jeffery Wheeler, Jeffery Wheeler.  The patient demonstrates the following risk factors for suicide: Chronic risk factors for suicide include: psychiatric disorder of Major Depressive Disorder, recurrent, severe with psychotic features and substance use disorder. Acute risk factors for suicide include:  Pt is suicidal with a plan . Protective factors for this patient include:  None . Considering these factors, the overall suicide risk at this point appears to be high. Patient is not appropriate for outpatient follow up.  Jeffery Wheeler is a 58 year old male who presents voluntary and unaccompanied to [facility]. Clinician asked the pt, what brought you to the hospital? Pt reports, he's been suicidal for two days with a plan to overdose on his blood pressure medications. Pt reports, being rejected from substance use treatment programs in [place] and [employer] triggered his ideations. Pt reports. I want to hurt anybody. Pt reports, Pt reports seeing shadow people, and he can get a gun. Pt denies, self-injurious behaviors. Pt denies, currently having access to a gun.   Pt reports, drinking a 40oz beer. Pt reports, smoking an 8 Ball of Crack. Pt's UDS is positive for Benzodiazepines, Cocaine and Marijuana. Pt's BAL is <10. Pt denies, being linked to OPT resources (medication management and/or counseling.) Per chart, pt has previous inpatient admission to [facility] from 02/21/2022-02/28/2022 for Major Depressive Disorder.   Pt presents quiet, awake in scrubs with normal speech. During the assessment, pt was a very poor historian continued to dose off and on then completely fall asleep not responding to re-engagement. Pt reported, he received sleep medications however pt's nurse denies. Pt's mood was sad. Pt's affect was  flat. Pt's insight and judgement are poor.   Chief Complaint:  Chief Complaint  Patient presents with   Psychiatric Evaluation   Visit Diagnosis: Major Depressive Disorder, recurrent, severe with psychotic features.                                Alcohol use Disorder, severe.                               Cocaine use Disorder, severe.  CCA Screening, Triage and Referral (STR)  Patient Reported Information How did you hear about us ? Self  What Is the Reason for Your Visit/Call Today? Pt reports, he's suicidal with a plan and wants to hurt anybody. Pt reports seeing shadow people, and getting a gun. Pt denies, self-injurious behaviors.  How Long Has This Been Causing You Problems? <Week  What Do You Feel Would Help You the Most Today? Alcohol or Drug Use Treatment; Treatment for Depression or other mood problem; Housing Assistance   Have You Recently Had Any Thoughts About Hurting Yourself? Yes  Are You Planning to Commit Suicide/Harm Yourself At This time? Yes   Flowsheet Row ED from 06/14/2023 in [facility] Emergency Department at [facility] ED from [facility]/19/2023 in [facility] Ur[facility] Care at [facility] ED to Hosp-Admission (Discharged) from 11/30/2022 in [place] [facility] [place] [facility]  C-SSRS RISK CATEGORY High Risk No Risk Error: Q3, 4, or 5 should not be populated when Q2 is No       Have you Recently Had Thoughts About Hurting Someone Jeffery Wheeler? Yes  Are You Planning to Harm Someone at This Time? No  Explanation: None.   Have You Used Any Alcohol or Drugs in the Past 24 Hours? Yes  How Long Ago Did You Use Drugs or Alcohol? No data recorded What Did You Use and How Much? Pt reports, drinking Alcohol and using Cocaine yesterday.   Do You Currently Have a Therapist/Psychiatrist? No  Name of Therapist/Psychiatrist:    Have You Been Recently Discharged From Any Office Practice or Programs? No  Explanation of Discharge From Practice/Program: n/a     CCA  Screening Triage Referral Assessment Type of Contact: Face-to-Face  Telemedicine Service Delivery:   Is this Initial or Reassessment?   Date Telepsych consult ordered in [facility]:    Time Telepsych consult ordered in [facility]:    Location of Assessment: [facility] ED  Provider Location: [facility] Assessment Services   Collateral Involvement: None.   Does Patient Have a Automotive Engineer Guardian? No  Legal Guardian Contact Information: Pt is his own guardian.  Copy of Legal Guardianship Form: -- (Pt is his own guardian.)  Legal Guardian Notified of Arrival: -- (Pt is his own guardian.)  Legal Guardian Notified of Pending Discharge: -- (Pt is his own guardian.)  If Minor and Not Living with Parent(s), Who has Custody? Pt is an adult.  Is CPS involved or ever been involved? -- (UTA)  Is APS involved or ever been involved? -- (UTA)   Patient Determined To Be At Risk for Harm To Self or Others Based on Review of Patient Reported Information or Presenting Complaint? Yes, for Self-Harm  Method: Plan with intent and identified person  Availability of Means: Has close by  Intent: Clearly intends on inflicting harm that could cause death  Notification Required: No need or identified person  Additional Information for Danger to Others Potential: -- (None.)  Additional Comments for Danger to Others Potential: None.  Are There Guns or Other Weapons in Your Home? No  Types of Guns/Weapons: Pt reports, he can get guns but does not have with currently.  Are These Weapons Safely Secured?                            -- (NA)  Who Could Verify You Are Able To Have These Secured: None.  Do You Have any Outstanding Charges, Pending Court Dates, Parole/Probation? No.  Contacted To Inform of Risk of Harm To Self or Others: Other: Comment (None.)    Does Patient Present under Involuntary Commitment? No    [place] of Residence: [place]   Patient Currently Receiving the Following Services: --  (Pt to be observed and reassessed by psychiatry.)   Determination of Need: Urgent (48 hours)   Options For Referral: Medication Management; Inpatient Hospitalization; Outpatient Therapy; [facility] Urgent Care; Facility-Based Crisis     CCA Biopsychosocial Patient Reported Schizophrenia/Schizoaffective Diagnosis in Past: No   Strengths: Pt wants to go into treatment.   Mental Health Symptoms Depression:  Fatigue; Hopelessness; Worthlessness; Tearfulness; Irritability; Sleep (too much or little)   Duration of Depressive symptoms: Duration of Depressive Symptoms: Less than two weeks   Mania:  None   Anxiety:   Worrying; Irritability; Fatigue; Difficulty concentrating   Psychosis:  Hallucinations   Duration of Psychotic symptoms: Duration of Psychotic Symptoms: N/A   Trauma:  -- (UTA, pt sleeping.)   Obsessions:  -- (UTA, pt sleeping.)   Compulsions:  -- (UTA, pt sleeping.)   Inattention:  Forgetful  Hyperactivity/Impulsivity:  -- (UTA, pt sleeping.)   Oppositional/Defiant Behaviors:  Angry   Emotional Irregularity:  Recurrent suicidal behaviors/gestures/threats   Other Mood/Personality Symptoms:  None.    Mental Status Exam Appearance and self-care  Stature:  Average   Weight:  Average weight   Clothing:  -- (Scrubs.)   Grooming:  Normal   Cosmetic use:  None   Posture/gait:  Normal   Motor activity:  Slowed   Sensorium  Attention:  Inattentive   Concentration:  Variable   Orientation:  Person; Place; Situation   Recall/memory:  Defective in Immediate   Affect and Mood  Affect:  Flat   Mood:  Other (Comment) (Sad.)   Relating  Eye contact:  Avoided   Facial expression:  Responsive   Attitude toward examiner:  Cooperative   Thought and Language  Speech flow: Normal   Thought content:  Appropriate to Mood and Circumstances   Preoccupation:  Other (Comment) (Pt sleeping.)   Hallucinations:  Visual   Organization:  Circumstantial    Company Secretary of Knowledge:  Poor   Intelligence:  Average   Abstraction:  Abstract   Judgement:  Poor   Reality Testing:  Distorted   Insight:  Poor   Decision Making:  Impulsive   Social Functioning  Social Maturity:  Impulsive   Social Judgement:  Chief Of Staff   Stress  Stressors:  Other (Comment); Housing   Coping Ability:  Overwhelmed; Deficient supports   Skill Deficits:  Communication; Responsibility   Supports:  Support needed     Religion: Religion/Spirituality Are You A Religious Person?: Yes What is Your Religious Affiliation?: Christian How Might This Affect Treatment?: None.  Leisure/Recreation: Leisure / Recreation Do You Have Hobbies?:  (UTA, pt sleeping.)  Exercise/Diet: Exercise/Diet Do You Exercise?:  (UTA, pt sleeping.) Have You Gained or Lost A Significant Amount of Weight in the Past Six Months?:  (UTA, pt sleeping.) Do You Follow a Special Diet?:  (UTA, pt sleeping.) Do You Have Any Trouble Sleeping?: Yes Explanation of Sleeping Difficulties: Pt reports, he's been up for four days.   CCA Employment/Education Employment/Work Situation: Employment / Work Situation Employment Situation: Unemployed Patient's Job has Been Impacted by Current Illness: No Has Patient ever Been in Equities Trader?: No  Education: Education Is Patient Currently Attending School?: No Last Grade Completed: 9 Did You Product Manager?: No Did You Have An Individualized Education Program (IIEP): No Did You Have Any Difficulty At [employer]?: No Patient's Education Has Been Impacted by Current Illness: No   CCA Family/Childhood History Family and Relationship History: Family history Marital status: Single Does patient have children?: No  Childhood History:  Childhood History By whom was/is the patient raised?: Other (Comment) (Aunt.) Did patient suffer any verbal/emotional/physical/sexual abuse as a child?:  (UTA, pt sleeping.) Did patient  suffer from severe childhood neglect?:  (UTA, pt sleeping.) Has patient ever been sexually abused/assaulted/raped as an adolescent or adult?:  (UTA, pt sleeping.) Was the patient ever a victim of a crime or a disaster?:  (UTA, pt sleeping.) Witnessed domestic violence?:  (UTA, pt sleeping.) Has patient been affected by domestic violence as an adult?:  (UTA, pt sleeping.)   CCA Substance Use Alcohol/Drug Use: Alcohol / Drug Use Pain Medications: See MAR Prescriptions: See MAR Over the Counter: See MAR History of alcohol / drug use?: Yes Longest period of sobriety (when/how long): UTA, pt sleeping. Negative Consequences of Use: Work / Programmer, Multimedia, Surveyor, Quantity Withdrawal Symptoms: Other (Comment) (UTA, pt sleeping.) Substance #1 Name  of Substance 1: Alcohol. 1 - Age of First Use: Sine 1993. 1 - Amount (size/oz): Pt reports, drinking a 40oz beer. 1 - Frequency: Daily. 1 - Duration: Daily. 1 - Last Use / Amount: Yesterday (06/13/2023). 1 - Method of Aquiring: UTA 1- Route of Use: Oral. Substance #2 Name of Substance 2: Crack Cocaine. 2 - Age of First Use: 19. 2 - Amount (size/oz): Pt reports, smoking an 8 Ball of Crack. 2 - Frequency: Daily. 2 - Duration: Daily. 2 - Last Use / Amount: Yesterday (06/13/2023). 2 - Method of Aquiring: UTA 2 - Route of Substance Use: Smoke.    ASAM's:  Six Dimensions of Multidimensional Assessment  Dimension 1:  Acute Intoxication and/or Withdrawal Potential:   Dimension 1:  Description of individual's past and current experiences of substance use and withdrawal: None.  Dimension 2:  Biomedical Conditions and Complications:   Dimension 2:  Description of patient's biomedical conditions and  complications: Per chart, pt has previous diagnoses of: Type 2 diabetes mellitus (HCC), HTN (hypertension), HLD (hyperlipidemia).  Dimension 3:  Emotional, Behavioral, or Cognitive Conditions and Complications:  Dimension 3:  Description of emotional, behavioral, or  cognitive conditions and complications: Depressed mood, suicidal, homicidal. Per chart, pt has previous diagnoses of: MDD (major depressive disorder), recurrent severe, without psychosis (HCC), Cocaine-induced mood disorder (HCC), Alcohol use disorder.  Dimension 4:  Readiness to Change:  Dimension 4:  Description of Readiness to Change criteria: Pt reports, wanting to get in a treatment center but was denied.  Dimension 5:  Relapse, Continued use, or Continued Problem Potential:  Dimension 5:  Relapse, continued use, or continued problem potential critiera description: Pt has ongoing continued use of substances.  Dimension 6:  Recovery/Living Environment:  Dimension 6:  Recovery/Iiving environment criteria description: Pt is homeless and does not like living outside.  ASAM Severity Score: ASAM's Severity Rating Score: 11  ASAM Recommended Level of Treatment: ASAM Recommended Level of Treatment: Level II Intensive Outpatient Treatment   Substance use Disorder (SUD) Substance Use Disorder (SUD)  Checklist Symptoms of Substance Use: Continued use despite having a persistent/recurrent physical/psychological problem caused/exacerbated by use, Continued use despite persistent or recurrent social, interpersonal problems, caused or exacerbated by use  Recommendations for Services/Supports/Treatments: Recommendations for Services/Supports/Treatments Recommendations For Services/Supports/Treatments: Other (Comment) (Pt to be observed and reassessed by psychiatry.)  Disposition Recommendation per psychiatric provider: Pt to be observed and reassessed by psychiatry.    DSM5 Diagnoses: Patient Active Problem List   Diagnosis Date Noted   Cellulitis of left leg 12/01/2022   Cellulitis 11/30/2022   Cellulitis of hand, right 11/30/2022   Alcohol use disorder 02/22/2022   Cocaine use disorder (HCC) 02/22/2022   MDD (major depressive disorder), recurrent severe, without psychosis (HCC) 02/21/2022    Polysubstance abuse (HCC) 01/25/2022   Cocaine-induced mood disorder (HCC)    Cocaine use disorder, severe, dependence (HCC)    Increased urinary frequency 11/20/2020   Nocturia more than twice per night 11/20/2020   Incomplete emptying of bladder 11/20/2020   Psychophysiological insomnia 11/20/2020   Generalized anxiety disorder 11/20/2020   Alcohol abuse 11/20/2020   Substance abuse (HCC) 11/20/2020   HLD (hyperlipidemia) 09/13/2020   HTN (hypertension) 09/13/2020   Type 2 diabetes mellitus (HCC) 09/13/2020   Back pain 09/13/2020   Eczema 09/13/2020   Tobacco use disorder 09/13/2020     Referrals to Alternative Service(s): Referred to Alternative Service(s):   Place:   Date:   Time:    Referred to Alternative Service(s):  Place:   Date:   Time:    Referred to Alternative Service(s):   Place:   Date:   Time:    Referred to Alternative Service(s):   Place:   Date:   Time:     Jeffery Wheeler, [facility] Comprehensive Clinical Assessment (CCA) Screening, Triage and Referral Note  06/15/2023 Jeffery Wheeler [id number]  Chief Complaint:  Chief Complaint  Patient presents with   Psychiatric Evaluation   Visit Diagnosis:   Patient Reported Information How did you hear about us ? Self  What Is the Reason for Your Visit/Call Today? Pt reports, he's suicidal with a plan and wants to hurt anybody. Pt reports seeing shadow people, and getting a gun. Pt denies, self-injurious behaviors.  How Long Has This Been Causing You Problems? <Week  What Do You Feel Would Help You the Most Today? Alcohol or Drug Use Treatment; Treatment for Depression or other mood problem; Housing Assistance   Have You Recently Had Any Thoughts About Hurting Yourself? Yes  Are You Planning to Commit Suicide/Harm Yourself At This time? Yes   Have you Recently Had Thoughts About Hurting Someone Jeffery Wheeler? Yes  Are You Planning to Harm Someone at This Time? No  Explanation: None.   Have You Used Any  Alcohol or Drugs in the Past 24 Hours? Yes  How Long Ago Did You Use Drugs or Alcohol? No data recorded What Did You Use and How Much? Pt reports, drinking Alcohol and using Cocaine yesterday.   Do You Currently Have a Therapist/Psychiatrist? No  Name of Therapist/Psychiatrist: n/a   Have You Been Recently Discharged From Any Office Practice or Programs? No  Explanation of Discharge From Practice/Program: n/a    CCA Screening Triage Referral Assessment Type of Contact: Face-to-Face  Telemedicine Service Delivery:   Is this Initial or Reassessment?   Date Telepsych consult ordered in [facility]:    Time Telepsych consult ordered in [facility]:    Location of Assessment: [facility] ED  Provider Location: [facility] Assessment Services    Collateral Involvement: None.   Does Patient Have a Automotive Engineer Guardian? No data recorded Name and Contact of Legal Guardian: No data recorded If Minor and Not Living with Parent(s), Who has Custody? Pt is an adult.  Is CPS involved or ever been involved? -- (UTA)  Is APS involved or ever been involved? -- (UTA)   Patient Determined To Be At Risk for Harm To Self or Others Based on Review of Patient Reported Information or Presenting Complaint? Yes, for Self-Harm  Method: Plan with intent and identified person  Availability of Means: Has close by  Intent: Clearly intends on inflicting harm that could cause death  Notification Required: No need or identified person  Additional Information for Danger to Others Potential: -- (None.)  Additional Comments for Danger to Others Potential: None.  Are There Guns or Other Weapons in Your Home? No  Types of Guns/Weapons: Pt reports, he can get guns but does not have with currently.  Are These Weapons Safely Secured?                            -- (NA)  Who Could Verify You Are Able To Have These Secured: None.  Do You Have any Outstanding Charges, Pending Court Dates, Parole/Probation?  No.  Contacted To Inform of Risk of Harm To Self or Others: Other: Comment (None.)   Does Patient Present under Involuntary Commitment? No  [place] of Residence: [place]   Patient Currently Receiving the Following Services: -- (Pt to be observed and reassessed by psychiatry.)   Determination of Need: Urgent (48 hours)   Options For Referral: Medication Management; Inpatient Hospitalization; Outpatient Therapy; [facility] Urgent Care; Facility-Based Crisis   Disposition Recommendation per psychiatric provider: Pt to be observed and reassessed by psychiatry.   Jeffery Wheeler, LCMHC   Zan Triska D Lakeva Hollon, MS, [facility], [facility] Triage Specialist [phone]

## 2023-06-16 ENCOUNTER — Encounter (HOSPITAL_COMMUNITY): Payer: Self-pay

## 2023-06-16 DIAGNOSIS — F332 Major depressive disorder, recurrent severe without psychotic features: Secondary | ICD-10-CM

## 2023-06-16 LAB — VITAMIN D 25 HYDROXY (VIT D DEFICIENCY, FRACTURES): Vit D, 25-Hydroxy: 20.6 ng/mL — ABNORMAL LOW (ref 30–100)

## 2023-06-16 LAB — VITAMIN B12: Vitamin B-12: 507 pg/mL (ref 180–914)

## 2023-06-16 LAB — TSH: TSH: 1.833 u[IU]/mL (ref 0.350–4.500)

## 2023-06-16 MED ORDER — SERTRALINE HCL 50 MG PO TABS
50.0000 mg | ORAL_TABLET | Freq: Every day | ORAL | Status: DC
  Administered 2023-06-16 – 2023-06-22 (×7): 50 mg via ORAL
  Filled 2023-06-16 (×9): qty 1
  Filled 2023-06-16: qty 14

## 2023-06-16 MED ORDER — INFLUENZA VIRUS VACC SPLIT PF (FLUZONE) 0.5 ML IM SUSY
0.5000 mL | PREFILLED_SYRINGE | INTRAMUSCULAR | Status: DC
  Filled 2023-06-16: qty 0.5

## 2023-06-16 NOTE — Progress Notes (Signed)
   06/15/23 2205  Psych Admission Type (Psych Patients Only)  Admission Status Voluntary  Psychosocial Assessment  Patient Complaints Depression;Hopelessness;Substance abuse  Eye Contact Brief  Facial Expression Flat  Affect Appropriate to circumstance;Sad;Flat  Speech Slow  Interaction Isolative  Motor Activity Slow;Unsteady  Appearance/Hygiene In scrubs  Behavior Characteristics Appropriate to situation;Cooperative  Mood Depressed;Helpless;Pleasant  Thought Process  Coherency WDL  Content WDL  Delusions None reported or observed  Perception Hallucinations  Hallucination Visual  Judgment Poor  Confusion None  Danger to Self  Current suicidal ideation? Plan  Description of Suicide Plan overdose on medications  Self-Injurious Behavior No self-injurious ideation or behavior indicators observed or expressed   Agreement Not to Harm Self Yes  Description of Agreement verbally  Danger to Others  Danger to Others None reported or observed

## 2023-06-16 NOTE — Progress Notes (Signed)
   06/16/23 0903  Psych Admission Type (Psych Patients Only)  Admission Status Voluntary  Psychosocial Assessment  Patient Complaints Depression  Eye Contact Brief  Facial Expression Flat  Affect Flat  Speech Logical/coherent  Interaction Isolative  Motor Activity Slow  Appearance/Hygiene Unremarkable  Behavior Characteristics Guarded  Mood Depressed  Thought Process  Coherency WDL  Content WDL  Delusions None reported or observed  Perception WDL  Hallucination None reported or observed  Judgment Poor  Confusion None  Danger to Self  Current suicidal ideation? Denies  Agreement Not to Harm Self Yes  Description of Agreement verbal  Danger to Others  Danger to Others None reported or observed

## 2023-06-16 NOTE — Progress Notes (Signed)
     06/16/2023       2:33 PM   Jeffery Wheeler   Type of Note: Substance Use Referrals  [facility] - faxed 06/16/23  [facility] - faxed 06/16/23 to [email or web address]; spoke with [facility] in admissions, reports that they are full through the end of the month but will give this writer a call back if anything changes.   [facility] - faxed 06/16/23 to [email or web address]  Signed:  Boni Maclellan, LCSW-A 06/16/2023  2:33 PM

## 2023-06-16 NOTE — [facility] Suicide Risk Assessment (Signed)
 Suicide Risk Assessment  Admission Assessment    [facility] Admission Suicide Risk Assessment   Nursing information obtained from:    Demographic factors:  Male, Unemployed, Low socioeconomic status Current Mental Status:  Suicide plan Loss Factors:  Financial problems / change in socioeconomic status Historical Factors:  Prior suicide attempts Risk Reduction Factors:  NA  Total Time spent with patient: 30 minutes Principal Problem: MDD (major depressive disorder) Diagnosis:  Principal Problem:   MDD (major depressive disorder)  Subjective Data: Jeffery Wheeler is a 58 y.o. African-American male with prior psychiatric diagnoses significant for hopelessness, alcohol use disorder, cocaine use disorder who presents voluntarily to [facility] for complaint of worsening depression resulting in suicidal ideation with plan to overdose with medications or die by "SI by cops" in the context of polysubstance usage.   Continued Clinical Symptoms:  Alcohol Use Disorder Identification Test Final Score (AUDIT): 31 The "Alcohol Use Disorders Identification Test", Guidelines for Use in Primary Care, Second Edition.  World Science writer [facility]). Score between 0-7:  no or low risk or alcohol related problems. Score between 8-15:  moderate risk of alcohol related problems. Score between 16-19:  high risk of alcohol related problems. Score 20 or above:  warrants further diagnostic evaluation for alcohol dependence and treatment.  CLINICAL FACTORS:   Severe Anxiety and/or Agitation Depression:   Anhedonia Comorbid alcohol abuse/dependence Hopelessness Impulsivity Severe Alcohol/Substance Abuse/Dependencies More than one psychiatric diagnosis  Musculoskeletal: Strength & Muscle Tone: within normal limits Gait & Station: normal Patient leans: N/A  Psychiatric Specialty Exam:  Presentation  General Appearance:  Appropriate for Environment; Fairly Groomed; Casual  Eye  Contact: Good  Speech: Clear and Coherent  Speech Volume: Normal  Handedness: Right  Mood and Affect  Mood: Depressed; Anxious  Affect: Appropriate; Congruent  Thought Process  Thought Processes: Coherent  Descriptions of Associations:Intact  Orientation:Full (Time, Place and Person)  Thought Content:Logical  History of Schizophrenia/Schizoaffective disorder:No  Duration of Psychotic Symptoms:N/A  Hallucinations:Hallucinations: None  Ideas of Reference:None  Suicidal Thoughts:Suicidal Thoughts: No  Homicidal Thoughts:Homicidal Thoughts: No  Sensorium  Memory: Immediate Good; Recent Good  Judgment: Fair  Insight: Fair  Art therapist  Concentration: Good  Attention Span: Good  Recall: Good  Fund of Knowledge: Good  Language: Good  Psychomotor Activity  Psychomotor Activity: Psychomotor Activity: Normal  Assets  Assets: Communication Skills; Physical Health; Resilience  Sleep  Sleep: Sleep: Good Number of Hours of Sleep: 7.25  Physical Exam: Physical Exam Vitals and nursing note reviewed.  HENT:     Head: Normocephalic.     Nose: Nose normal.     Mouth/Throat:     Mouth: Mucous membranes are moist.     Pharynx: Oropharynx is clear.  Eyes:     Extraocular Movements: Extraocular movements intact.  Cardiovascular:     Rate and Rhythm: Normal rate.     Pulses: Normal pulses.  Pulmonary:     Effort: Pulmonary effort is normal.  Abdominal:     Comments: Deferred  Genitourinary:    Comments: Deferred Musculoskeletal:        General: Normal range of motion.     Cervical back: Normal range of motion.  Skin:    General: Skin is warm.  Neurological:     General: No focal deficit present.     Mental Status: He is alert and oriented to person, place, and time.  Psychiatric:        Mood and Affect: Mood normal.  Behavior: Behavior normal.        Thought Content: Thought content normal.    Review of Systems   Constitutional:  Negative for chills and fever.  HENT:  Negative for sore throat.   Eyes:  Negative for blurred vision.  Respiratory:  Negative for cough, sputum production, shortness of breath and wheezing.   Cardiovascular:  Negative for chest pain and palpitations.  Gastrointestinal:  Negative for abdominal pain, constipation, diarrhea, heartburn, nausea and vomiting.  Genitourinary:  Negative for dysuria, frequency and urgency.  Musculoskeletal:  Positive for back pain and joint pain. Negative for myalgias.  Skin:  Negative for itching and rash.  Neurological:  Negative for dizziness, tingling, tremors and headaches.  Endo/Heme/Allergies:        See allergy listing  Psychiatric/Behavioral:  Positive for depression, hallucinations and substance abuse. The patient is nervous/anxious.    Blood pressure (!) 150/74, pulse 79, temperature 98.1 F (36.7 C), temperature source Oral, resp. rate 16, height 5\' 7"  (1.702 m), weight 85.5 kg, SpO2 98%. Body mass index is 29.51 kg/m.  COGNITIVE FEATURES THAT CONTRIBUTE TO RISK:  Polarized thinking    SUICIDE RISK:   Severe:  Frequent, intense, and enduring suicidal ideation, specific plan, no subjective intent, but some objective markers of intent (i.e., choice of lethal method), the method is accessible, some limited preparatory behavior, evidence of impaired self-control, severe dysphoria/symptomatology, multiple risk factors present, and few if any protective factors, particularly a lack of social support.  PLAN OF CARE: Treatment Plan Summary: Daily contact with patient to assess and evaluate symptoms and progress in treatment, Medication management. Physician Treatment Plan for Primary Diagnosis:  Assessment: MDD (major depressive disorder)  Plan: Medication: -- Continue Zoloft  tablet 50 mg p.o. daily for depression and anxiety --Continue trazodone  tablet 50 mg p.o. daily as needed nightly for insomnia --Continue hydroxyzine  tablet 25  mg p.o. 3 times daily as needed for anxiety  CIWA protocol: See MAR  Agitation protocol: Benadryl  capsule 50 mg p.o. or IM 3 times daily as needed agitation   Haldol  tablets 5 mg po IM 3 times daily as needed agitation   Lorazepam  tablet 2 mg p.o. or IM 3 times daily as needed agitation    Other PRN Medications -Acetaminophen  650 mg every 6 as needed/mild pain -Maalox 30 mL oral every 4 as needed/digestion -Magnesium  hydroxide 30 mL daily as needed/mild constipation  --The risks/benefits/side-effects/alternatives to this medication were discussed in detail with the patient and time was given for questions. The patient consents to medication trial.  -- Metabolic profile and EKG monitoring obtained while on an atypical antipsychotic (BMI: Lipid Panel: HbgA1c: QTc:)  -- Encouraged patient to participate in unit milieu and in scheduled group therapies   Admission labs reviewed: CMP: CO2 21 low, glucose 171 elevated, BUN 22 elevated, calcium  8.4 low.  Otherwise normal.  CBC with differentials: WBC 12.4 elevated, hemoglobin 10.9 low, HCT 35.6 low, MCH 24.9 low, platelets 429 high, RDW 18.8 elevated, neutrophils 8.8 elevated, monocyte 1.1 elevated, otherwise normal.  UDS: Positive for benzodiazepines, positive for cocaine, positive for marijuana.  New labs ordered: TSH, vitamin B-12, vitamin D  25-hydroxy.  EKG reviewed: Normal sinus rhythm, ventricular rate 72, QT/QTc :392/429   Safety and Monitoring: Voluntary admission to inpatient psychiatric unit for safety, stabilization and treatment Daily contact with patient to assess and evaluate symptoms and progress in treatment Patient's case to be discussed in multi-disciplinary team meeting Observation Level : q15 minute checks Vital signs: q12 hours Precautions: suicide, but  pt currently verbally contracts for safety on unit    Discharge Planning: Social work and case management to assist with discharge planning and identification of hospital  follow-up needs prior to discharge Estimated LOS: 5-7 days Discharge Concerns: Need to establish a safety plan; Medication compliance and effectiveness Discharge Goals: Return home with outpatient referrals for mental health follow-up including medication management/psychotherapy.  Long Term Goal(s): Improvement in symptoms so as ready for discharge  Short Term Goals: Ability to identify changes in lifestyle to reduce recurrence of condition will improve, Ability to verbalize feelings will improve, Ability to disclose and discuss suicidal ideas, Ability to demonstrate self-control will improve, Ability to identify and develop effective coping behaviors will improve, Ability to maintain clinical measurements within normal limits will improve, Compliance with prescribed medications will improve, and Ability to identify triggers associated with substance abuse/mental health issues will improve  Physician Treatment Plan for Secondary Diagnosis: Principal Problem:   MDD (major depressive disorder)  I certify that inpatient services furnished can reasonably be expected to improve the patient's condition.   Laurence Pons, FNP 06/16/2023, 8:49 AM

## 2023-06-16 NOTE — Group Note (Signed)
 Date:  06/16/2023 Time:  11:53 AM  Group Topic/Focus:  Goals Group:   The focus of this group is to help patients establish daily goals to achieve during treatment and discuss how the patient can incorporate goal setting into their daily lives to aide in recovery. Orientation:   The focus of this group is to educate the patient on the purpose and policies of crisis stabilization and provide a format to answer questions about their admission.  The group details unit policies and expectations of patients while admitted.    Participation Level:  Did Not Attend  Participation Quality:   n/a  Affect:   n/a  Cognitive:   n/a  Insight: None  Engagement in Group:   n/a  Modes of Intervention:   n/a  Additional Comments:   Pt did not attend.  Shade Darby Jasen Hartstein 06/16/2023, 11:53 AM

## 2023-06-16 NOTE — Group Note (Signed)
 Date:  06/16/2023 Time:  4:44 PM  Group Topic/Focus:  Healthy Communication:   The focus of this group is to discuss communication, barriers to communication, as well as healthy ways to communicate with others. Managing Feelings:   The focus of this group is to identify what feelings patients have difficulty handling and develop a plan to handle them in a healthier way upon discharge.    Participation Level:  Did Not Attend  Participation Quality:   n/a  Affect:   n/a  Cognitive:   n/a  Insight: None  Engagement in Group:   n/a  Modes of Intervention:   n/a  Additional Comments:   Pt did not attend.  Shade Darby Kory Rains 06/16/2023, 4:44 PM

## 2023-06-16 NOTE — BH IP Treatment Plan (Signed)
 Interdisciplinary Treatment and Diagnostic Plan Update  06/16/2023 Time of Session: 11:22AM Jeffery Wheeler MRN: [id number]  Principal Diagnosis: MDD (major depressive disorder)  Secondary Diagnoses: Principal Problem:   MDD (major depressive disorder)   Current Medications:  Current Facility-Administered Medications  Medication Dose Route Frequency Provider Last Rate Last Admin   acetaminophen  (TYLENOL ) tablet 650 mg  650 mg Oral Q6H PRN Motley-Mangrum, Jadeka A, PMHNP   650 mg at 06/15/23 2203   alum & mag hydroxide-simeth (MAALOX/MYLANTA) 200-200-20 MG/5ML suspension 30 mL  30 mL Oral Q4H PRN Motley-Mangrum, Jadeka A, PMHNP       amLODipine  (NORVASC ) tablet 10 mg  10 mg Oral Daily Motley-Mangrum, Jadeka A, PMHNP   10 mg at 06/16/23 5284   haloperidol  (HALDOL ) tablet 5 mg  5 mg Oral TID PRN Motley-Mangrum, Jadeka A, PMHNP       And   diphenhydrAMINE  (BENADRYL ) capsule 50 mg  50 mg Oral TID PRN Motley-Mangrum, Jadeka A, PMHNP       haloperidol  lactate (HALDOL ) injection 5 mg  5 mg Intramuscular TID PRN Motley-Mangrum, Jadeka A, PMHNP       And   diphenhydrAMINE  (BENADRYL ) injection 50 mg  50 mg Intramuscular TID PRN Motley-Mangrum, Jadeka A, PMHNP       And   LORazepam  (ATIVAN ) injection 2 mg  2 mg Intramuscular TID PRN Motley-Mangrum, Jadeka A, PMHNP       haloperidol  lactate (HALDOL ) injection 10 mg  10 mg Intramuscular TID PRN Motley-Mangrum, Jadeka A, PMHNP       And   diphenhydrAMINE  (BENADRYL ) injection 50 mg  50 mg Intramuscular TID PRN Motley-Mangrum, Jadeka A, PMHNP       And   LORazepam  (ATIVAN ) injection 2 mg  2 mg Intramuscular TID PRN Motley-Mangrum, Jadeka A, PMHNP       folic acid  (FOLVITE ) tablet 1 mg  1 mg Oral Daily Motley-Mangrum, Jadeka A, PMHNP   1 mg at 06/16/23 1324   hydrOXYzine  (ATARAX ) tablet 25 mg  25 mg Oral TID PRN Motley-Mangrum, Jadeka A, PMHNP       [START ON 06/17/2023] influenza vac split trivalent PF (FLULAVAL) injection 0.5 mL  0.5 mL  Intramuscular Tomorrow-1000 Attiah, Nadir, MD       LORazepam  (ATIVAN ) tablet 1-4 mg  1-4 mg Oral Q1H PRN Motley-Mangrum, Jadeka A, PMHNP       Or   LORazepam  (ATIVAN ) injection 1-4 mg  1-4 mg Intravenous Q1H PRN Motley-Mangrum, Jadeka A, PMHNP       magnesium  hydroxide (MILK OF MAGNESIA) suspension 30 mL  30 mL Oral Daily PRN Motley-Mangrum, Jadeka A, PMHNP       metFORMIN  (GLUCOPHAGE ) tablet 850 mg  850 mg Oral BID WC Motley-Mangrum, Jadeka A, PMHNP   850 mg at 06/16/23 0903   multivitamin with minerals tablet 1 tablet  1 tablet Oral Daily Motley-Mangrum, Jadeka A, PMHNP   1 tablet at 06/16/23 0902   pneumococcal 20-valent conjugate vaccine (PREVNAR 20) injection 0.5 mL  0.5 mL Intramuscular Tomorrow-1000 Nkwenti, Doris, NP       sertraline  (ZOLOFT ) tablet 50 mg  50 mg Oral Daily Ntuen, Tina C, FNP   50 mg at 06/16/23 1132   thiamine  (Vitamin B-1) tablet 100 mg  100 mg Oral Daily Motley-Mangrum, Jadeka A, PMHNP   100 mg at 06/16/23 0902   Or   thiamine  (VITAMIN B1) injection 100 mg  100 mg Intravenous Daily Motley-Mangrum, Jadeka A, PMHNP       traZODone  (DESYREL ) tablet 50  mg  50 mg Oral QHS PRN Motley-Mangrum, Jadeka A, PMHNP       PTA Medications: Medications Prior to Admission  Medication Sig Dispense Refill Last Dose/Taking   amLODipine  (NORVASC ) 10 MG tablet Take 1 tablet (10 mg total) by mouth daily. 30 tablet 0 06/16/2023 Morning   atorvastatin  (LIPITOR) 20 MG tablet Take 1 tablet (20 mg total) by mouth daily. 30 tablet 0 06/15/2023   metFORMIN  (GLUCOPHAGE ) 1000 MG tablet Take 1,000 mg by mouth 2 (two) times daily with a meal.   06/15/2023   chlordiazePOXIDE (LIBRIUM) 25 MG capsule Take 25 mg by mouth 3 (three) times daily. (Patient not taking: Reported on 06/16/2023)   Not Taking   gabapentin  (NEURONTIN ) 300 MG capsule Take 300 mg by mouth 3 (three) times daily. (Patient not taking: Reported on 06/16/2023)   Not Taking   sertraline  (ZOLOFT ) 50 MG tablet Take 1 tablet (50 mg total) by mouth  daily. (Patient not taking: Reported on 05/20/2023) 30 tablet 0 Not Taking    Patient Stressors: Financial difficulties   Substance abuse    Patient Strengths: Capable of independent living  Forensic psychologist fund of knowledge   Treatment Modalities: Medication Management, Group therapy, Case management,  1 to 1 session with clinician, Psychoeducation, Recreational therapy.   Physician Treatment Plan for Primary Diagnosis: MDD (major depressive disorder) Long Term Goal(s): Improvement in symptoms so as ready for discharge   Short Term Goals: Ability to identify changes in lifestyle to reduce recurrence of condition will improve Ability to verbalize feelings will improve Ability to disclose and discuss suicidal ideas Ability to demonstrate self-control will improve Ability to identify and develop effective coping behaviors will improve Ability to maintain clinical measurements within normal limits will improve Compliance with prescribed medications will improve Ability to identify triggers associated with substance abuse/mental health issues will improve  Medication Management: Evaluate patient's response, side effects, and tolerance of medication regimen.  Therapeutic Interventions: 1 to 1 sessions, Unit Group sessions and Medication administration.  Evaluation of Outcomes: Not Progressing  Physician Treatment Plan for Secondary Diagnosis: Principal Problem:   MDD (major depressive disorder)  Long Term Goal(s): Improvement in symptoms so as ready for discharge   Short Term Goals: Ability to identify changes in lifestyle to reduce recurrence of condition will improve Ability to verbalize feelings will improve Ability to disclose and discuss suicidal ideas Ability to demonstrate self-control will improve Ability to identify and develop effective coping behaviors will improve Ability to maintain clinical measurements within normal limits will improve Compliance with  prescribed medications will improve Ability to identify triggers associated with substance abuse/mental health issues will improve     Medication Management: Evaluate patient's response, side effects, and tolerance of medication regimen.  Therapeutic Interventions: 1 to 1 sessions, Unit Group sessions and Medication administration.  Evaluation of Outcomes: Not Progressing   RN Treatment Plan for Primary Diagnosis: MDD (major depressive disorder) Long Term Goal(s): Knowledge of disease and therapeutic regimen to maintain health will improve  Short Term Goals: Ability to demonstrate self-control, Ability to verbalize feelings will improve, Ability to identify and develop effective coping behaviors will improve, and Compliance with prescribed medications will improve  Medication Management: RN will administer medications as ordered by provider, will assess and evaluate patient's response and provide education to patient for prescribed medication. RN will report any adverse and/or side effects to prescribing provider.  Therapeutic Interventions: 1 on 1 counseling sessions, Psychoeducation, Medication administration, Evaluate responses to treatment, Monitor vital signs  and CBGs as ordered, Perform/monitor CIWA, COWS, AIMS and Fall Risk screenings as ordered, Perform wound care treatments as ordered.  Evaluation of Outcomes: Not Progressing   LCSW Treatment Plan for Primary Diagnosis: MDD (major depressive disorder) Long Term Goal(s): Safe transition to appropriate next level of care at discharge, Engage patient in therapeutic group addressing interpersonal concerns.  Short Term Goals: Engage patient in aftercare planning with referrals and resources, Increase social support, Facilitate acceptance of mental health diagnosis and concerns, Facilitate patient progression through stages of change regarding substance use diagnoses and concerns, and Identify triggers associated with mental  health/substance abuse issues  Therapeutic Interventions: Assess for all discharge needs, 1 to 1 time with Social worker, Explore available resources and support systems, Assess for adequacy in community support network, Educate family and significant other(s) on suicide prevention, Complete Psychosocial Assessment, Interpersonal group therapy.  Evaluation of Outcomes: Not Progressing   Progress in Treatment: Attending groups: No. Participating in groups: No. Taking medication as prescribed: Yes. Toleration medication: Yes. Family/Significant other contact made: No, will contact:  ex-girlfriend Birdia Buhl Patient understands diagnosis: Yes. Discussing patient identified problems/goals with staff: Yes. Medical problems stabilized or resolved: Yes. Denies suicidal/homicidal ideation: Yes. Issues/concerns per patient self-inventory: No. Other: None  New problem(s) identified: No, Describe:  None  New Short Term/Long Term Goal(s): detox, medication management for mood stabilization; elimination of SI thoughts; development of comprehensive mental wellness/sobriety plan  Patient Goals:  :"Try to get off drugs and alcohol"  Discharge Plan or Barriers: Patient recently admitted. CSW will continue to follow and assess for appropriate referrals and possible discharge planning.    Reason for Continuation of Hospitalization: Anxiety Depression Medication stabilization Withdrawal symptoms Other; describe discharge planning  Estimated Length of Stay: 3-5 Days  Last 3 [place] Suicide Severity Risk Score: Flowsheet Row [facility] (Current) [facility]14[facility]2023 in [facility] ED from 04/19/2023 in [facility] RISK CATEGORY High Risk High Risk No Risk       Last PHQ 2/9 Scores:    02/03/2022    1:07 PM 01/25/2022    1:06 PM 01/24/2022   10:05 AM   Depression screen PHQ 2/9  Decreased Interest 3 3 2   Down, Depressed, Hopeless 3 3 2   PHQ - 2 Score 6 6 4   Altered sleeping 3 1 3   Tired, decreased energy 3 3 2   Change in appetite 0 0 1  Feeling bad or failure about yourself  3 3 2   Trouble concentrating 2 1 1   Moving slowly or fidgety/restless 0 0 0  Suicidal thoughts 2 3 2   PHQ-9 Score 19 17 15   Difficult doing work/chores Extremely dIfficult Somewhat difficult     Scribe for Treatment Team: Vernestine Gondola, LCSW 06/16/2023 2:23 PM

## 2023-06-16 NOTE — H&P (Addendum)
 Psychiatric Admission Assessment Adult  Patient Identification: Jeffery Wheeler MRN:  [id number] Date of Evaluation:  06/16/2023 Chief Complaint:  MDD (major depressive disorder) [F32.9] Principal Diagnosis: MDD (major depressive disorder) Diagnosis:  Principal Problem:   MDD (major depressive disorder)  CC: " I was suicidal."  History of Present Illness: Jeffery Wheeler is a 58 y.o. African-American male with prior psychiatric diagnoses significant for hopelessness, alcohol use disorder, cocaine use disorder who presents voluntarily to [facility] for complaint of worsening depression resulting in suicidal ideation with plan to overdose with medications or die by "SI by cops" in the context of polysubstance usage.  After medical evaluation/stabilization & clearance, he was transferred to the [facility] for further psychiatric evaluation & treatments.   During this evaluation, patient reports that over the past 1 week, he had been severely depressed.  Reports thoughts of killing himself by taking pills or attempting suicide by cops.  Reports history of suicide attempt in the past by overdosing on fentanyl .  Also reported thought of harming someone else over a drug dispute within the past week.  He reported drinking 6-40 ounces of alcohol a day and using 8 balls daily of crack cocaine, several blunts of marijuana smoking daily and any other drug that he may lay his hands on.  UDS positive for cocaine, marijuana, and benzodiazepines.  Last alcohol drinking was 06/13/2023, and last cocaine use/marijuana use 06/13/2023.  Patient report being incarcerated for 20 years and was discharged in 2023 for bank robbery.  Denies being followed by a psychiatrist or a therapist at this time and added that he is homeless currently.  Report being treated in [facility] rehabilitation for drug and alcohol in 2023 and also in [facility] in 2023.  Currently requesting for long term rehabilitation for drug and  alcohol.  He denies symptoms of mania, OCD, or PTSD.  However, endorses symptoms of depression to include sadness, anhedonia, fatigue, worthlessness and guilt, hopelessness, recurrent thoughts of death, anxiety, and loss of energy.  He reports symptoms of anxiety to include excessive worry and elevated heart rate.  Objective: Patient seen and examined on [address] lying down on his bed.  He presents alert, calm, oriented to person, time, place, and situation.  Chart reviewed and findings shared with the treatment team and consult with attending psychiatrist.  Speech is coherent with normal volume and pattern.  Able to maintain fair eye contact with this provider.  Thought processes is logical organized and coherent.  Thought content with flight of ideas, fair insight and poor judgment.  Patient denies SI, HI, AH.  However, endorses visual hallucination of seeing shadows. He denies delusional thinking or paranoia.  Vital signs reviewed without critical values except blood pressure of 150/74.  Labs and EKG reviewed as indicated in the treatment plan.  Patient is admitted for safety, medication management, and mood stabilization.  CIWA protocol in place to prevent alcohol withdrawal seizures.  Current pseudoword score of 3 today.  Mode of transport to Hospital: Safe transport Current Outpatient (Home) Medication List: See home medication listing PRN medication prior to evaluation: See home medication listing  ED course: Labs and EKG will obtain and analyzed     Collateral Information: None obtained at this time POA/Legal Guardian: Patient is his own legal guardian  Past Psychiatric Hx: Previous Psych Diagnoses: Abuse, cocaine induced mood disorder, MDD, tobacco use disorder, GAD, alcohol abuse Prior inpatient treatment: Yes at [facility] 23 Current/prior outpatient treatment: Patient denies Prior rehab  hx: Yes at [place] [facility] 2023 Psychotherapy hx: Yes History of suicide: Yes x 1 in  2023, OD with fentanyl  History of homicide or aggression: Denies Psychiatric medication history: Patient has been on trial Zoloft , gabapentin , Librium. Psychiatric medication compliance history: Noncompliance due to homelessness Neuromodulation history: Denies Current Psychiatrist: Denies Current therapist: Denies   Substance Abuse Hx: Alcohol: Consumes 640 ounces of alcohol beer daily Tobacco: Half a pack daily Illicit drugs: Crack cocaine 8 balls per day, marijuana several blunts per day Rx drug abuse: Yes at [facility] in 2023 and [place] [facility] in 2023 Rehab hx: Yes  Past Medical History: Medical Diagnoses: Diabetes, hypertension, hyperlipidemia, arthritis. Home Rx: Yes Prior Hosp: Denies Prior Surgeries/Trauma: Denies Head trauma, LOC, concussions, seizures: Denies Allergies: Latex  Drug Ingredient Rash, Other (See Comments) Low Allergy 01/23/2022 Past Updates  Skin breaks out Pt denies allergy 06/16/23  Shellfish-derived Products  Drug Class Rash Low Allergy 03/14/2016 Past Updates   LMP: Not applicable Contraception: Not applicable PCP: Denies  Family History: Medical: Patient unsure Psych: Patient unsure Psych Rx: Patient unsure SA/HA: Patient unsure Substance use family hx: Patient unsure  Social History: Childhood (bring, raised, lives now, parents, siblings, schooling, education): High school diploma Abuse: Denies history of abuse Marital Status: Single Sexual orientation: Male Children: 0 Employment: Unemployed Peer Group: Denies Housing: Homelessness Finances: Water quality scientist: None currently.  Out in 2023, from incarceration for 20 years for bank robbery. Military: Not affiliated with the military  Associated Signs/Symptoms: Depression Symptoms:  depressed mood, anhedonia, fatigue, feelings of worthlessness/guilt, difficulty concentrating, hopelessness, recurrent thoughts of death, suicidal thoughts with specific  plan, anxiety, loss of energy/fatigue, (Hypo) Manic Symptoms:  Flight of Ideas, Irritable Mood, Anxiety Symptoms:  Excessive Worry, Social Anxiety, Psychotic Symptoms:  Hallucinations: Visual PTSD Symptoms: NA Total Time spent with patient: 1 hour  Is the patient at risk to self? Yes.    Has the patient been a risk to self in the past 6 months? Yes.    Has the patient been a risk to self within the distant past? Yes.    Is the patient a risk to others? No.  Has the patient been a risk to others in the past 6 months? No.  Has the patient been a risk to others within the distant past? No.   [place] Scale:  Flowsheet Row [facility] ([facility]) [facility] from 06/14/2023 in [facility] ED from 04/19/2023 in [facility] RISK CATEGORY High Risk High Risk No Risk      Alcohol Screening: 1. How often do you have a drink containing alcohol?: 4 or more times a week 2. How many drinks containing alcohol do you have on a typical day when you are drinking?: 7, 8, or 9 3. How often do you have six or more drinks on one occasion?: Daily or almost daily AUDIT-C Score: 11 4. How often during the last year have you found that you were not able to stop drinking once you had started?: Daily or almost daily 5. How often during the last year have you failed to do what was normally expected from you because of drinking?: Daily or almost daily 6. How often during the last year have you needed a first drink in the morning to get yourself going after a heavy drinking session?: Daily or almost daily 7. How often during the  last year have you had a feeling of guilt of remorse after drinking?: Daily or almost daily 8. How often during the last year have you been unable to remember what happened the night before because you had been drinking?: Daily or almost daily 9. Have you or  someone else been injured as a result of your drinking?: No 10. Has a relative or friend or a doctor or another health worker been concerned about your drinking or suggested you cut down?: No Alcohol Use Disorder Identification Test Final Score (AUDIT): 31 Substance Abuse History in the last 12 months:  Yes.   Consequences of Substance Abuse: Discussed with patient during this admission evaluation. Medical Consequences:  Liver damage, Possible death by overdose Legal Consequences:  Arrests, jail time, Loss of driving privilege. Family Consequences:  Family discord, divorce and or separation.  Previous Psychotropic Medications: Yes Psychological Evaluations: Yes  Past Medical History:  Past Medical History:  Diagnosis Date   Eczema    HLD (hyperlipidemia)    HTN (hypertension)    Type 2 diabetes mellitus (HCC)     Past Surgical History:  Procedure Laterality Date   CERVICAL SPINE SURGERY  2018   right shoulder  1991   Family History:  Family History  Problem Relation Age of Onset   Diabetes type II Sister    Diabetes type II Paternal Grandmother    Diabetes type II Maternal Aunt    Tobacco Screening:  Social History   Tobacco Use  Smoking Status Every Day   Current packs/day: 0.50   Types: Cigarettes  Smokeless Tobacco Never    BH Tobacco Counseling     Are you interested in Tobacco Cessation Medications?  No value filed. Counseled patient on smoking cessation:  No value filed. Reason Tobacco Screening Not Completed: No value filed.   Social History:  Social History   Substance and Sexual Activity  Alcohol Use Yes   Alcohol/week: 200.0 standard drinks of alcohol   Types: 80 Cans of beer, 120 Shots of liquor per week   Comment: reports periodically drinking as much as 4x 40 oz malt liquer and 1x fifth of tequila daily as fund permit     Social History   Substance and Sexual Activity  Drug Use Not Currently   Types: "Crack" cocaine, Methamphetamines,  Marijuana, Fentanyl    Comment: Consistantly using crack cocaine almost on a daily basis, cannabis occasionally, other drugs as presented to him by other drug users    Additional Social History:  Allergies:   Allergies  Allergen Reactions   Latex Rash and Other (See Comments)    Skin breaks out Pt denies allergy 06/16/23   Shellfish-Derived Products Rash   Lab Results:  Results for orders placed or performed during the hospital encounter of 06/15/23 (from the past 48 hours)  Glucose, capillary     Status: Abnormal   Collection Time: 06/15/23 10:00 PM  Result Value Ref Range   Glucose-Capillary 163 (H) 70 - 99 mg/dL    Comment: Glucose reference range applies only to samples taken after fasting for at least 8 hours.   Comment 1 Notify RN    Comment 2 Document in Chart    Blood Alcohol level:  Lab Results  Component Value Date   ETH <10 06/14/2023   ETH <10 11/09/2022   Metabolic Disorder Labs:  Lab Results  Component Value Date   HGBA1C 7.4 (H) 02/23/2022   MPG 165.68 02/23/2022   No results found for: "PROLACTIN" Lab Results  Component Value Date   CHOL 140 02/23/2022   TRIG 64 02/23/2022   HDL 42 02/23/2022   CHOLHDL 3.3 02/23/2022   VLDL 13 02/23/2022   LDLCALC 85 02/23/2022   LDLCALC 76 02/22/2022   Current Medications: Current Facility-Administered Medications  Medication Dose Route Frequency Provider Last Rate Last Admin   acetaminophen  (TYLENOL ) tablet 650 mg  650 mg Oral Q6H PRN Motley-Mangrum, Jadeka A, PMHNP   650 mg at 06/15/23 2203   alum & mag hydroxide-simeth (MAALOX/MYLANTA) 200-200-20 MG/5ML suspension 30 mL  30 mL Oral Q4H PRN Motley-Mangrum, Jadeka A, PMHNP       amLODipine  (NORVASC ) tablet 10 mg  10 mg Oral Daily Motley-Mangrum, Jadeka A, PMHNP   10 mg at 06/16/23 0640   haloperidol  (HALDOL ) tablet 5 mg  5 mg Oral TID PRN Motley-Mangrum, Jadeka A, PMHNP       And   diphenhydrAMINE  (BENADRYL ) capsule 50 mg  50 mg Oral TID PRN Motley-Mangrum, Jadeka  A, PMHNP       haloperidol  lactate (HALDOL ) injection 5 mg  5 mg Intramuscular TID PRN Motley-Mangrum, Jadeka A, PMHNP       And   diphenhydrAMINE  (BENADRYL ) injection 50 mg  50 mg Intramuscular TID PRN Motley-Mangrum, Jadeka A, PMHNP       And   LORazepam  (ATIVAN ) injection 2 mg  2 mg Intramuscular TID PRN Motley-Mangrum, Jadeka A, PMHNP       haloperidol  lactate (HALDOL ) injection 10 mg  10 mg Intramuscular TID PRN Motley-Mangrum, Jadeka A, PMHNP       And   diphenhydrAMINE  (BENADRYL ) injection 50 mg  50 mg Intramuscular TID PRN Motley-Mangrum, Jadeka A, PMHNP       And   LORazepam  (ATIVAN ) injection 2 mg  2 mg Intramuscular TID PRN Motley-Mangrum, Jadeka A, PMHNP       folic acid  (FOLVITE ) tablet 1 mg  1 mg Oral Daily Motley-Mangrum, Jadeka A, PMHNP       hydrOXYzine  (ATARAX ) tablet 25 mg  25 mg Oral TID PRN Motley-Mangrum, Jadeka A, PMHNP       LORazepam  (ATIVAN ) tablet 1-4 mg  1-4 mg Oral Q1H PRN Motley-Mangrum, Jadeka A, PMHNP       Or   LORazepam  (ATIVAN ) injection 1-4 mg  1-4 mg Intravenous Q1H PRN Motley-Mangrum, Jadeka A, PMHNP       magnesium  hydroxide (MILK OF MAGNESIA) suspension 30 mL  30 mL Oral Daily PRN Motley-Mangrum, Jadeka A, PMHNP       metFORMIN  (GLUCOPHAGE ) tablet 850 mg  850 mg Oral BID WC Motley-Mangrum, Jadeka A, PMHNP   850 mg at 06/15/23 1831   multivitamin with minerals tablet 1 tablet  1 tablet Oral Daily Motley-Mangrum, Jadeka A, PMHNP       pneumococcal 20-valent conjugate vaccine (PREVNAR 20) injection 0.5 mL  0.5 mL Intramuscular Tomorrow-1000 Nkwenti, Doris, NP       sertraline  (ZOLOFT ) tablet 50 mg  50 mg Oral Daily Motley-Mangrum, Jadeka A, PMHNP       thiamine  (Vitamin B-1) tablet 100 mg  100 mg Oral Daily Motley-Mangrum, Jadeka A, PMHNP       Or   thiamine  (VITAMIN B1) injection 100 mg  100 mg Intravenous Daily Motley-Mangrum, Jadeka A, PMHNP       traZODone  (DESYREL ) tablet 50 mg  50 mg Oral QHS PRN Motley-Mangrum, Jadeka A, PMHNP       PTA  Medications: Medications Prior to Admission  Medication Sig Dispense Refill Last Dose/Taking   chlordiazePOXIDE (LIBRIUM) 25  MG capsule Take 25 mg by mouth 3 (three) times daily. (Patient not taking: Reported on 06/16/2023)   Not Taking   gabapentin  (NEURONTIN ) 300 MG capsule Take 300 mg by mouth 3 (three) times daily. (Patient not taking: Reported on 06/16/2023)   Not Taking   amLODipine  (NORVASC ) 10 MG tablet Take 1 tablet (10 mg total) by mouth daily. 30 tablet 0 06/16/2023 Morning   atorvastatin  (LIPITOR) 20 MG tablet Take 1 tablet (20 mg total) by mouth daily. 30 tablet 0 06/15/2023   metFORMIN  (GLUCOPHAGE ) 1000 MG tablet Take 1,000 mg by mouth 2 (two) times daily with a meal.   06/15/2023   sertraline  (ZOLOFT ) 50 MG tablet Take 1 tablet (50 mg total) by mouth daily. (Patient not taking: Reported on 05/20/2023) 30 tablet 0 Not Taking   Musculoskeletal: Strength & Muscle Tone: within normal limits Gait & Station: normal Patient leans: N/A  Psychiatric Specialty Exam:  Presentation  General Appearance:  Appropriate for Environment; Fairly Groomed; Casual  Eye Contact: Good  Speech: Clear and Coherent  Speech Volume: Normal  Handedness: Right  Mood and Affect  Mood: Depressed; Anxious  Affect: Appropriate; Congruent  Thought Process  Thought Processes: Coherent  Duration of Psychotic Symptoms: 2 years Past Diagnosis of Schizophrenia or Psychoactive disorder: No  Descriptions of Associations:Intact  Orientation:Full (Time, Place and Person)  Thought Content:Logical  Hallucinations:Hallucinations: None  Ideas of Reference:None  Suicidal Thoughts:Suicidal Thoughts: No  Homicidal Thoughts:Homicidal Thoughts: No  Sensorium  Memory: Immediate Good; Recent Good  Judgment: Fair  Insight: Fair  Art therapist  Concentration: Good  Attention Span: Good  Recall: Good  Fund of Knowledge: Good  Language: Good  Psychomotor Activity  Psychomotor  Activity: Psychomotor Activity: Normal  Assets  Assets: Communication Skills; Physical Health; Resilience  Sleep  Sleep: Sleep: Good Number of Hours of Sleep: 7.25  Physical Exam: Physical Exam Vitals and nursing note reviewed.  HENT:     Head: Normocephalic.     Nose: Nose normal.     Mouth/Throat:     Mouth: Mucous membranes are moist.     Pharynx: Oropharynx is clear.  Eyes:     Extraocular Movements: Extraocular movements intact.  Cardiovascular:     Rate and Rhythm: Normal rate.     Pulses: Normal pulses.  Pulmonary:     Effort: Pulmonary effort is normal.  Abdominal:     Comments: Deferred  Genitourinary:    Comments: Deferred Musculoskeletal:        General: Normal range of motion.     Cervical back: Normal range of motion.  Skin:    General: Skin is warm.  Neurological:     General: No focal deficit present.     Mental Status: He is alert and oriented to person, place, and time.  Psychiatric:        Mood and Affect: Mood normal.   Review of Systems  Constitutional:  Negative for chills and fever.  HENT:  Negative for sore throat.   Eyes:  Negative for blurred vision.  Respiratory:  Negative for cough, sputum production, shortness of breath and wheezing.   Cardiovascular:  Negative for chest pain and palpitations.  Gastrointestinal:  Negative for abdominal pain, constipation, diarrhea, heartburn, nausea and vomiting.  Genitourinary:  Negative for dysuria, frequency and urgency.  Musculoskeletal:  Positive for back pain.  Skin:  Negative for rash.  Neurological:  Negative for dizziness, tingling, tremors, seizures and headaches.  Endo/Heme/Allergies:        See allergy listing  Psychiatric/Behavioral:  Positive for depression and substance abuse. The patient is nervous/anxious.    Blood pressure (!) 150/74, pulse 79, temperature 98.1 F (36.7 C), temperature source Oral, resp. rate 16, height 5\' 7"  (1.702 m), weight 85.5 kg, SpO2 98%. Body mass index  is 29.51 kg/m.  Treatment Plan Summary: Daily contact with patient to assess and evaluate symptoms and progress in treatment, Medication management. Physician Treatment Plan for Primary Diagnosis:  Assessment: MDD (major depressive disorder)  Plan: Medication: -- Continue Zoloft  tablet 50 mg p.o. daily for depression and anxiety --Continue trazodone  tablet 50 mg p.o. daily as needed nightly for insomnia --Continue hydroxyzine  tablet 25 mg p.o. 3 times daily as needed for anxiety  CIWA protocol: See MAR  Agitation protocol: Benadryl  capsule 50 mg p.o. or IM 3 times daily as needed agitation   Haldol  tablets 5 mg po IM 3 times daily as needed agitation   Lorazepam  tablet 2 mg p.o. or IM 3 times daily as needed agitation    Other PRN Medications -Acetaminophen  650 mg every 6 as needed/mild pain -Maalox 30 mL oral every 4 as needed/digestion -Magnesium  hydroxide 30 mL daily as needed/mild constipation  --The risks/benefits/side-effects/alternatives to this medication were discussed in detail with the patient and time was given for questions. The patient consents to medication trial.  -- Metabolic profile and EKG monitoring obtained while on an atypical antipsychotic (BMI: Lipid Panel: HbgA1c: QTc:)  -- Encouraged patient to participate in unit milieu and in scheduled group therapies   Admission labs reviewed: CMP: CO2 21 low, glucose 171 elevated, BUN 22 elevated, calcium  8.4 low.  Otherwise normal.  CBC with differentials: WBC 12.4 elevated, hemoglobin 10.9 low, HCT 35.6 low, MCH 24.9 low, platelets 429 high, RDW 18.8 elevated, neutrophils 8.8 elevated, monocyte 1.1 elevated, otherwise normal.  UDS: Positive for benzodiazepines, positive for cocaine, positive for marijuana.  New labs ordered: TSH, vitamin B-12, vitamin D  25-hydroxy.  EKG reviewed: Normal sinus rhythm, ventricular rate 72, QT/QTc :392/429   Safety and Monitoring: Voluntary admission to inpatient psychiatric unit for  safety, stabilization and treatment Daily contact with patient to assess and evaluate symptoms and progress in treatment Patient's case to be discussed in multi-disciplinary team meeting Observation Level : q15 minute checks Vital signs: q12 hours Precautions: suicide, but pt currently verbally contracts for safety on unit    Discharge Planning: Social work and case management to assist with discharge planning and identification of hospital follow-up needs prior to discharge Estimated LOS: 5-7 days Discharge Concerns: Need to establish a safety plan; Medication compliance and effectiveness Discharge Goals: Return home with outpatient referrals for mental health follow-up including medication management/psychotherapy.  Long Term Goal(s): Improvement in symptoms so as ready for discharge  Short Term Goals: Ability to identify changes in lifestyle to reduce recurrence of condition will improve, Ability to verbalize feelings will improve, Ability to disclose and discuss suicidal ideas, Ability to demonstrate self-control will improve, Ability to identify and develop effective coping behaviors will improve, Ability to maintain clinical measurements within normal limits will improve, Compliance with prescribed medications will improve, and Ability to identify triggers associated with substance abuse/mental health issues will improve  Physician Treatment Plan for Secondary Diagnosis: Principal Problem:   MDD (major depressive disorder)  I certify that inpatient services furnished can reasonably be expected to improve the patient's condition.    Damya Comley C Dametria Tuzzolino, FNP 2/10/20258:54 AM
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
## 2023-06-16 NOTE — Group Note (Signed)
 Recreation Therapy Group Note   Group Topic:Stress Management  Group Date: 06/16/2023 Start Time: 0935 End Time: 0954 Facilitators: Jeffery Wheeler, LRT,CTRS Location: [address] Dayroom   Group Topic: Stress Management   Goal Area(s) Addresses:  Patient will identify positive stress management techniques. Patient will identify benefits of using stress management post d/c.   Intervention: Insight Timer App   Activity: Meditation. LRT played a meditation that focused on reducing stress, anxiety and worry. Patients were to focus on their breathing and allow the meditation to relax and calm them in order to get the full affect of the meditation.     Education:  Stress Management, Discharge Planning.    Education Outcome: Acknowledges Education   Affect/Mood: N/A   Participation Level: Did not attend    Clinical Observations/Individualized Feedback:      Plan: Continue to engage patient in RT group sessions 2-3x/week.   Jeffery Wheeler, LRT,CTRS 06/16/2023 12:27 PM

## 2023-06-16 NOTE — [facility] Group Notes (Signed)

## 2023-06-16 NOTE — Plan of Care (Signed)
   Problem: Safety: Goal: Periods of time without injury will increase Outcome: Progressing

## 2023-06-17 DIAGNOSIS — F332 Major depressive disorder, recurrent severe without psychotic features: Secondary | ICD-10-CM

## 2023-06-17 MED ORDER — VITAMIN D3 25 MCG PO TABS
1000.0000 [IU] | ORAL_TABLET | Freq: Every day | ORAL | Status: DC
  Administered 2023-06-17 – 2023-06-22 (×6): 1000 [IU] via ORAL
  Filled 2023-06-17 (×8): qty 1
  Filled 2023-06-17: qty 14

## 2023-06-17 NOTE — [facility] Group Notes (Signed)
[facility] Group Notes:  (Nursing/MHT/Case Management/Adjunct)  Date:  06/17/2023  Time:  2000  Type of Therapy:   wrap up group  Participation Level:  Active  Participation Quality:  Appropriate and Attentive  Affect:  Appropriate  Cognitive:  Alert and Appropriate  Insight:  Appropriate and Good  Engagement in Group:  Engaged  Modes of Intervention:  Discussion  Summary of Progress/Problems:  [name] 06/17/2023, 9:24 PM

## 2023-06-17 NOTE — Group Note (Signed)
Date:  06/17/2023 Time:  9:36 AM  Group Topic/Focus:  Goals Group:   The focus of this group is to help patients establish daily goals to achieve during treatment and discuss how the patient can incorporate goal setting into their daily lives to aide in recovery. Orientation:   The focus of this group is to educate the patient on the purpose and policies of crisis stabilization and provide a format to answer questions about their admission.  The group details unit policies and expectations of patients while admitted.    Participation Level:  Did Not Attend   Arnoldo Hooker 06/17/2023, 9:36 AM

## 2023-06-17 NOTE — Progress Notes (Signed)
   06/16/23 2200  Psych Admission Type (Psych Patients Only)  Admission Status Voluntary  Psychosocial Assessment  Patient Complaints Depression;Other (Comment)  Eye Contact Brief  Facial Expression Pensive;Sad  Affect Constricted;Depressed;Irritable  Speech Logical/coherent (minimal)  Interaction Guarded;Isolative;Assertive (Pt reported slight nausea but declined meds to address. Pt reported ongoing bil. leg pain but declined meds.)  Motor Activity Slow  Appearance/Hygiene Unremarkable  Behavior Characteristics Guarded (minimal interaction)  Mood Other (Comment);Irritable;Depressed (Pt had slight irritable edge but agreed to short discussion. Pt was reading in bed for positive coping strategy but declined group. "I just want to sleep. It's the only thing that clears my mind.")  Thought Process  Coherency WDL  Content WDL  Delusions None reported or observed  Perception WDL  Hallucination None reported or observed  Judgment Impaired  Confusion None  Danger to Self  Current suicidal ideation? Denies  Self-Injurious Behavior No self-injurious ideation or behavior indicators observed or expressed   Agreement Not to Harm Self Yes  Description of Agreement Verbal  Danger to Others  Danger to Others None reported or observed

## 2023-06-17 NOTE — Progress Notes (Signed)
   06/17/23 0946  Psych Admission Type (Psych Patients Only)  Admission Status Voluntary  Psychosocial Assessment  Patient Complaints Depression  Eye Contact Brief  Facial Expression Anxious  Affect Anxious  Speech Soft  Interaction Guarded  Motor Activity Slow  Appearance/Hygiene Unremarkable  Behavior Characteristics Guarded  Mood Depressed  Thought Process  Coherency WDL  Content WDL  Delusions None reported or observed  Perception WDL  Hallucination None reported or observed  Judgment Impaired  Confusion None  Danger to Self  Current suicidal ideation? Denies  Self-Injurious Behavior No self-injurious ideation or behavior indicators observed or expressed   Agreement Not to Harm Self Yes  Description of Agreement Verbal  Danger to Others  Danger to Others None reported or observed

## 2023-06-17 NOTE — Progress Notes (Signed)
[facility] MD Progress Note  06/17/2023 2:53 PM Jeffery Wheeler  MRN:  [id number] Principal Problem: MDD (major depressive disorder) Diagnosis: Principal Problem:   MDD (major depressive disorder)  Reason for admission:  Allah Reason is a 58 y.o. African-American male with prior psychiatric diagnoses significant for hopelessness, alcohol use disorder, cocaine use disorder who presents voluntarily to [facility] for complaint of worsening depression resulting in suicidal ideation with plan to overdose with medications or die by "SI by cops" in the context of polysubstance usage.    Yesterday the psychiatry team made the following recommendations: --Continue Zoloft tablet 50 mg p.o. daily for depression and anxiety --Continue trazodone tablet 50 mg p.o. daily as needed nightly for insomnia --Continue hydroxyzine tablet 25 mg p.o. 3 times daily as needed for anxiety   CIWA protocol: See [facility]  Today's assessment notes: On assessment today, the pt reports that his mood is less depressed.  Rates depression as #4/10 with 10 being high severity.  Presents alert, calm, cooperative, and oriented to person, time, place, and situation.  Reports that he did not sleep well last night, however nursing staff report patient sleeping over 12 hours through the night and being restful.  Report being compliant with his medications.  No acute discomfort observed.  Denies delusional thinking, ideas of reference or paranoia.  Attention to hygiene is fair.  Reports his is attempting to call [facility] for placement to treat his drug and alcohol use.  LCSW assisting patient in this process at this time.  Further denies SI, HI, VH.  However, endorses seeing shadows at the corners of his eyes.  No withdrawal symptoms from alcohol observed.  Last CIWA score of 1 noted on flow sheets on 06/16/2023. Reports that anxiety is at manageable level Appetite is okay Concentration is good Energy level is  adequate Denies suicidal thoughts.  Denies suicidal intent and plan.  Denies having any HI.  Endorses seeing shadows at the corner of his eyes.  Denies having side effects to current psychiatric medications.   We compliance to current medication regimen. Discussed the following psychosocial stressors: Attending therapeutic milieu and unit group activities, as these has proven to improve patient's mood.  Total Time spent with patient: 45 minutes  Past Psychiatric History: Previous Psych Diagnoses: Abuse, cocaine induced mood disorder, MDD, tobacco use disorder, GAD, alcohol abuse Prior inpatient treatment: Yes at [facility] H20 23 Current/prior outpatient treatment: Patient denies Prior rehab hx: Yes at [facility] in 2023 Psychotherapy hx: Yes History of suicide: Yes x 1 in 2023, OD with fentanyl History of homicide or aggression: Denies Psychiatric medication history: Patient has been on trial Zoloft, gabapentin, Librium. Psychiatric medication compliance history: Noncompliance due to homelessness Neuromodulation history: Denies Current Psychiatrist: Denies Current therapist: Denies   Past Medical History:  Past Medical History:  Diagnosis Date   Eczema    HLD (hyperlipidemia)    HTN (hypertension)    Type 2 diabetes mellitus (HCC)     Past Surgical History:  Procedure Laterality Date   CERVICAL SPINE SURGERY  2018   right shoulder  1991   Family History:  Family History  Problem Relation Age of Onset   Diabetes type II Sister    Diabetes type II Paternal Grandmother    Diabetes type II Maternal Aunt    Family Psychiatric  History: See H&P Social History:  Social History   Substance and Sexual Activity  Alcohol Use Yes   Alcohol/week: 200.0  standard drinks of alcohol   Types: 80 Cans of beer, 120 Shots of liquor per week   Comment: reports periodically drinking as much as 4x 40 oz malt liquer and 1x fifth of tequila daily as fund permit     Social  History   Substance and Sexual Activity  Drug Use Not Currently   Types: "Crack" cocaine, Methamphetamines, Marijuana, Fentanyl   Comment: Consistantly using crack cocaine almost on a daily basis, cannabis occasionally, other drugs as presented to him by other drug users    Social History   Socioeconomic History   Marital status: Single    Spouse name: Not on file   Number of children: Not on file   Years of education: Not on file   Highest education level: Not on file  Occupational History   Not on file  Tobacco Use   Smoking status: Every Day    Current packs/day: 0.50    Types: Cigarettes   Smokeless tobacco: Never  Substance and Sexual Activity   Alcohol use: Yes    Alcohol/week: 200.0 standard drinks of alcohol    Types: 80 Cans of beer, 120 Shots of liquor per week    Comment: reports periodically drinking as much as 4x 40 oz malt liquer and 1x fifth of tequila daily as fund permit   Drug use: Not Currently    Types: "Crack" cocaine, Methamphetamines, Marijuana, Fentanyl    Comment: Consistantly using crack cocaine almost on a daily basis, cannabis occasionally, other drugs as presented to him by other drug users   Sexual activity: Yes    Partners: Female  Other Topics Concern   Not on file  Social History Narrative   ** Merged History Encounter **       ** Merged History Encounter **       Social Drivers of Corporate investment banker Strain: Low Risk  (03/21/2023)   Received from [facility]   Overall Financial Resource Strain (CARDIA)    Difficulty of Paying Living Expenses: Not very hard  Food Insecurity: Food Insecurity Present (06/15/2023)   Hunger Vital Sign    Worried About Running Out of Food in the Last Year: Often true    Ran Out of Food in the Last Year: Sometimes true  Transportation Needs: Unmet Transportation Needs (06/15/2023)   PRAPARE - Administrator, Civil Service (Medical): Yes    Lack of Transportation (Non-Medical): Yes   Physical Activity: Not on file  Stress: Not on file  Social Connections: Not on file   Additional Social History:      Sleep: Good  Appetite:  Good  Current Medications: Current Facility-Administered Medications  Medication Dose Route Frequency Provider Last Rate Last Admin   acetaminophen (TYLENOL) tablet 650 mg  650 mg Oral Q6H PRN Motley-Mangrum, Jadeka A, PMHNP   650 mg at 06/15/23 2203   alum & mag hydroxide-simeth (MAALOX/MYLANTA) 200-200-20 MG/5ML suspension 30 mL  30 mL Oral Q4H PRN Motley-Mangrum, Jadeka A, PMHNP       amLODipine (NORVASC) tablet 10 mg  10 mg Oral Daily Motley-Mangrum, Jadeka A, PMHNP   10 mg at 06/17/23 0733   haloperidol (HALDOL) tablet 5 mg  5 mg Oral TID PRN Motley-Mangrum, Jadeka A, PMHNP       And   diphenhydrAMINE (BENADRYL) capsule 50 mg  50 mg Oral TID PRN Motley-Mangrum, Jadeka A, PMHNP       haloperidol lactate (HALDOL) injection 5 mg  5 mg Intramuscular TID PRN  Motley-Mangrum, Jadeka A, PMHNP       And   diphenhydrAMINE (BENADRYL) injection 50 mg  50 mg Intramuscular TID PRN Motley-Mangrum, Jadeka A, PMHNP       And   LORazepam (ATIVAN) injection 2 mg  2 mg Intramuscular TID PRN Motley-Mangrum, Jadeka A, PMHNP       haloperidol lactate (HALDOL) injection 10 mg  10 mg Intramuscular TID PRN Motley-Mangrum, Jadeka A, PMHNP       And   diphenhydrAMINE (BENADRYL) injection 50 mg  50 mg Intramuscular TID PRN Motley-Mangrum, Jadeka A, PMHNP       And   LORazepam (ATIVAN) injection 2 mg  2 mg Intramuscular TID PRN Motley-Mangrum, Geralynn Ochs A, PMHNP       folic acid (FOLVITE) tablet 1 mg  1 mg Oral Daily Motley-Mangrum, Jadeka A, PMHNP   1 mg at 06/17/23 0733   hydrOXYzine (ATARAX) tablet 25 mg  25 mg Oral TID PRN Motley-Mangrum, Jadeka A, PMHNP       influenza vac split trivalent PF (FLULAVAL) injection 0.5 mL  0.5 mL Intramuscular Tomorrow-1000 Attiah, Nadir, MD       LORazepam (ATIVAN) tablet 1-4 mg  1-4 mg Oral Q1H PRN Motley-Mangrum, Jadeka A, PMHNP        Or   LORazepam (ATIVAN) injection 1-4 mg  1-4 mg Intravenous Q1H PRN Motley-Mangrum, Jadeka A, PMHNP       magnesium hydroxide (MILK OF MAGNESIA) suspension 30 mL  30 mL Oral Daily PRN Motley-Mangrum, Jadeka A, PMHNP       metFORMIN (GLUCOPHAGE) tablet 850 mg  850 mg Oral BID WC Motley-Mangrum, Jadeka A, PMHNP   850 mg at 06/17/23 0736   multivitamin with minerals tablet 1 tablet  1 tablet Oral Daily Motley-Mangrum, Jadeka A, PMHNP   1 tablet at 06/17/23 8295   pneumococcal 20-valent conjugate vaccine (PREVNAR 20) injection 0.5 mL  0.5 mL Intramuscular Tomorrow-1000 Nkwenti, Doris, NP       sertraline (ZOLOFT) tablet 50 mg  50 mg Oral Daily Deaisha Welborn, Jesusita Oka, FNP   50 mg at 06/17/23 0734   thiamine (Vitamin B-1) tablet 100 mg  100 mg Oral Daily Motley-Mangrum, Jadeka A, PMHNP   100 mg at 06/17/23 6213   Or   thiamine (VITAMIN B1) injection 100 mg  100 mg Intravenous Daily Motley-Mangrum, Jadeka A, PMHNP       traZODone (DESYREL) tablet 50 mg  50 mg Oral QHS PRN Motley-Mangrum, Jadeka A, PMHNP   50 mg at 06/16/23 2120   vitamin D3 (CHOLECALCIFEROL) tablet 1,000 Units  1,000 Units Oral Daily Deepti Gunawan, Jesusita Oka, FNP       Lab Results:  Results for orders placed or performed during the hospital encounter of 06/15/23 (from the past 48 hours)  Glucose, capillary     Status: Abnormal   Collection Time: 06/15/23 10:00 PM  Result Value Ref Range   Glucose-Capillary 163 (H) 70 - 99 mg/dL    Comment: Glucose reference range applies only to samples taken after fasting for at least 8 hours.   Comment 1 Notify RN    Comment 2 Document in Chart   Vitamin B12     Status: None   Collection Time: 06/16/23  6:13 PM  Result Value Ref Range   Vitamin B-12 507 180 - 914 pg/mL    Comment: (NOTE) This assay is not validated for testing neonatal or myeloproliferative syndrome specimens for Vitamin B12 levels. Performed at [facility], [address] [place],  [place] [address]   TSH      Status: None   Collection Time: 06/16/23  6:13 PM  Result Value Ref Range   TSH 1.833 0.350 - 4.500 uIU/mL    Comment: Performed by a 3rd Generation assay with a functional sensitivity of <=0.01 uIU/mL. Performed at [facility], [address], [place], [place] [address]   VITAMIN D 25 Hydroxy (Vit-D Deficiency, Fractures)     Status: Abnormal   Collection Time: 06/16/23  6:13 PM  Result Value Ref Range   Vit D, 25-Hydroxy 20.60 (L) 30 - 100 ng/mL    Comment: (NOTE) Vitamin D deficiency has been defined by the Institute of Medicine  and an Endocrine Society practice guideline as a level of serum 25-OH  vitamin D less than 20 ng/mL (1,2). The Endocrine Society went on to  further define vitamin D insufficiency as a level between 21 and 29  ng/mL (2).  1. IOM (Institute of Medicine). 2010. Dietary reference intakes for  calcium and D. [place]: [employer]. 2. Holick MF, Binkley [place], Bischoff-Ferrari HA, et al. Evaluation,  treatment, and prevention of vitamin D deficiency: an Endocrine  Society clinical practice guideline, JCEM. 2011 Jul; 96(7): 1911-30.  Performed at [facility], [address], [place], [place] [address]     Blood Alcohol level:  Lab Results  Component Value Date   [facility] <10 06/14/2023   ETH <10 11/09/2022    Metabolic Disorder Labs: Lab Results  Component Value Date   HGBA1C 7.4 (H) 02/23/2022   MPG 165.68 02/23/2022   No results found for: "PROLACTIN" Lab Results  Component Value Date   CHOL 140 02/23/2022   TRIG 64 02/23/2022   HDL 42 02/23/2022   CHOLHDL 3.3 02/23/2022   VLDL 13 02/23/2022   LDLCALC 85 02/23/2022   LDLCALC 76 02/22/2022   Physical Findings: AIMS:  , ,  ,  ,    CIWA:  CIWA-Ar Total: 1 COWS:     Musculoskeletal: Strength & Muscle Tone: within normal limits Gait & Station: normal Patient leans: N/A  Psychiatric Specialty Exam:  Presentation  General Appearance:   Appropriate for Environment; Casual  Eye Contact: Good  Speech: Clear and Coherent  Speech Volume: Normal  Handedness: Right  Mood and Affect  Mood: Anxious; Depressed  Affect: Congruent  Thought Process  Thought Processes: Coherent  Descriptions of Associations:Intact  Orientation:Full (Time, Place and Person)  Thought Content:Logical  History of Schizophrenia/Schizoaffective disorder:No  Duration of Psychotic Symptoms:N/A  Hallucinations:Hallucinations: Visual Description of Visual Hallucinations: Seeing shadows at the corner of his eyes  Ideas of Reference:None  Suicidal Thoughts:Suicidal Thoughts: No  Homicidal Thoughts:Homicidal Thoughts: No  Sensorium  Memory: Immediate Good; Recent Good  Judgment: Fair  Insight: Fair  Art therapist  Concentration: Good  Attention Span: Good  Recall: Good  Fund of Knowledge: Good  Language: Good  Psychomotor Activity  Psychomotor Activity: Psychomotor Activity: Normal  Assets  Assets: Communication Skills; Physical Health; Resilience  Sleep  Sleep: Sleep: Good Number of Hours of Sleep: 12  Physical Exam: Physical Exam Vitals and nursing note reviewed.  Constitutional:      General: He is not in acute distress.    Appearance: He is not ill-appearing or toxic-appearing.  HENT:     Head: Normocephalic.     Nose: Nose normal.     Mouth/Throat:     Mouth: Mucous membranes are moist.     Pharynx: Oropharynx is clear.  Eyes:  Extraocular Movements: Extraocular movements intact.  Cardiovascular:     Rate and Rhythm: Normal rate.     Pulses: Normal pulses.  Pulmonary:     Effort: Pulmonary effort is normal.  Abdominal:     Comments: Deferred  Genitourinary:    Comments: Deferred Musculoskeletal:        General: Normal range of motion.     Cervical back: Normal range of motion.  Skin:    General: Skin is warm.  Neurological:     General: No focal deficit present.      Mental Status: He is alert and oriented to person, place, and time.  Psychiatric:        Mood and Affect: Mood normal.        Behavior: Behavior normal.        Thought Content: Thought content normal.    Review of Systems  Constitutional:  Negative for chills and fever.  HENT:  Negative for sore throat.   Eyes:  Negative for blurred vision.  Respiratory:  Negative for cough, sputum production, shortness of breath and wheezing.   Cardiovascular:  Negative for chest pain and palpitations.  Gastrointestinal:  Negative for heartburn, nausea and vomiting.  Genitourinary:  Negative for dysuria, frequency and urgency.  Musculoskeletal:  Positive for back pain.  Skin:  Negative for itching and rash.  Neurological:  Negative for dizziness, tingling, tremors and headaches.  Endo/Heme/Allergies:        See allergy listing  Psychiatric/Behavioral:  Positive for depression. The patient is nervous/anxious.    Blood pressure (!) 147/75, pulse 82, temperature 97.6 F (36.4 C), temperature source Oral, resp. rate 20, height 5\' 7"  (1.702 m), weight 85.5 kg, SpO2 97%. Body mass index is 29.51 kg/m.  Treatment Plan Summary: Daily contact with patient to assess and evaluate symptoms and progress in treatment and Medication management Treatment Plan Summary: Daily contact with patient to assess and evaluate symptoms and progress in treatment, Medication management. Physician Treatment Plan for Primary Diagnosis:  Assessment: MDD (major depressive disorder)   Plan: Medication: -- Continue Zoloft tablet 50 mg p.o. daily for depression and anxiety --Continue trazodone tablet 50 mg p.o. daily as needed nightly for insomnia --Continue hydroxyzine tablet 25 mg p.o. 3 times daily as needed for anxiety   CIWA protocol: See MAR   Agitation protocol: Benadryl capsule 50 mg p.o. or IM 3 times daily as needed agitation   Haldol tablets 5 mg po IM 3 times daily as needed agitation   Lorazepam tablet 2 mg  p.o. or IM 3 times daily as needed agitation     Other PRN Medications -Acetaminophen 650 mg every 6 as needed/mild pain -Maalox 30 mL oral every 4 as needed/digestion -Magnesium hydroxide 30 mL daily as needed/mild constipation   --The risks/benefits/side-effects/alternatives to this medication were discussed in detail with the patient and time was given for questions. The patient consents to medication trial.  -- Metabolic profile and EKG monitoring obtained while on an atypical antipsychotic (BMI: Lipid Panel: HbgA1c: QTc:)  -- Encouraged patient to participate in unit milieu and in scheduled group therapies    Admission labs reviewed: CMP: CO2 21 low, glucose 171 elevated, BUN 22 elevated, calcium 8.4 low.  Otherwise normal.  CBC with differentials: WBC 12.4 elevated, hemoglobin 10.9 low, HCT 35.6 low, MCH 24.9 low, platelets 429 high, RDW 18.8 elevated, neutrophils 8.8 elevated, monocyte 1.1 elevated, otherwise normal.  UDS: Positive for benzodiazepines, positive for cocaine, positive for marijuana.   New labs ordered: TSH:  1.833 WNL, vitamin B-12: 507 WNL.  vitamin D 25-hydroxy: 20.60 low.  EKG reviewed: Normal sinus rhythm, ventricular rate 72, QT/QTc :392/429   Safety and Monitoring: Voluntary admission to inpatient psychiatric unit for safety, stabilization and treatment Daily contact with patient to assess and evaluate symptoms and progress in treatment Patient's case to be discussed in multi-disciplinary team meeting Observation Level : q15 minute checks Vital signs: q12 hours Precautions: suicide, but pt currently verbally contracts for safety on unit    Discharge Planning: Social work and case management to assist with discharge planning and identification of hospital follow-up needs prior to discharge Estimated LOS: 5-7 days Discharge Concerns: Need to establish a safety plan; Medication compliance and effectiveness Discharge Goals: Return home with outpatient referrals for  mental health follow-up including medication management/psychotherapy.   Long Term Goal(s): Improvement in symptoms so as ready for discharge   Short Term Goals: Ability to identify changes in lifestyle to reduce recurrence of condition will improve, Ability to verbalize feelings will improve, Ability to disclose and discuss suicidal ideas, Ability to demonstrate self-control will improve, Ability to identify and develop effective coping behaviors will improve, Ability to maintain clinical measurements within normal limits will improve, Compliance with prescribed medications will improve, and Ability to identify triggers associated with substance abuse/mental health issues will improve   Physician Treatment Plan for Secondary Diagnosis: Principal Problem:   MDD (major depressive disorder)  I certify that inpatient services furnished can reasonably be expected to improve the patient's condition.       Cecilie Lowers, FNP 06/17/2023, 2:54 PM

## 2023-06-17 NOTE — Group Note (Signed)
Recreation Therapy Group Note   Group Topic:Animal Assisted Therapy   Group Date: 06/17/2023 Start Time: 0945 End Time: 1030 Facilitators: Berline Semrad-McCall, LRT,CTRS Location: [address] Dayroom   Animal-Assisted Activity (AAA) Program Checklist/Progress Notes Patient Eligibility Criteria Checklist & Daily Group note for Rec Tx Intervention  AAA/T Program Assumption of Risk Form signed by Patient/ or Parent Legal Guardian Yes  Patient understands his/her participation is voluntary Yes  Education: Charity fundraiser, Appropriate Animal Interaction   Education Outcome: Acknowledges education.    Affect/Mood: N/A   Participation Level: Did not attend    Clinical Observations/Individualized Feedback:     Plan: Continue to engage patient in RT group sessions 2-3x/week.   Jeffery Wheeler, LRT,CTRS 06/17/2023 2:50 PM

## 2023-06-17 NOTE — Group Note (Signed)
LCSW Group Therapy Note   Group Date: 06/17/2023 Start Time: 1100 End Time: 1200   Participation:  did not attend  Title:  Healing Hearts: A Safe Space for Grief  Objective: The objective of this class is to create a compassionate environment where participants can process their grief, explore different stages of grief, and discover ways to honor their loved ones through personal rituals.  3 Goals: Provide a safe and supportive space where participants feel comfortable sharing their feelings and experiences of grief without judgment. Educate participants about the stages of grief and emphasize that there is no "right" way to grieve or a fixed timeline for healing. Introduce the concept of rituals as a means to process grief, allowing individuals to honor their loved ones in a personal and meaningful way.  Summary: In Healing Hearts: A Safe Space for Grief, we explored the unique and personal journey of grief, emphasizing that everyone experiences it differently.  We discussed the five stages of grief (denial, anger, bargaining, depression, and acceptance), with the understanding that grief is not linear.  Rituals were introduced as a way to help cope with loss, offering comfort and connection through meaningful actions such as lighting candles or taking memory walks.  Participants were encouraged to express their emotions, focus on self-care, and reflect on moments of gratitude for their loved ones, recognizing that healing is a process and there is no timeline for grief.  Therapeutic Modalities:  Elements of CBT (cognitive restructuring)  Elements of DBT (mindfulness, distress tolerance, radical acceptance)    Alla Feeling, LCSWA 06/17/2023  12:36 PM

## 2023-06-17 NOTE — Progress Notes (Signed)
     06/17/2023       10:20 AM   Vena Rua   Type of Note: [facility]   Spoke with Marcelino Duster at [facility] who reports that pt is accepted to their facility and can admit there on Monday, 06/23/23 by 9:00AM. MD and pt aware and agreeable to d/c plan.   Signed:  Mcarthur Ivins, LCSW-A 06/17/2023  10:20 AM

## 2023-06-17 NOTE — Progress Notes (Deleted)
Patient ID: Jeffery Wheeler, male   DOB: 1965/08/22, 58 y.o.   MRN: [id number]

## 2023-06-17 NOTE — Plan of Care (Signed)
Problem: Education: Goal: Knowledge of [facility] Education information/materials will improve Outcome: Progressing Goal: Emotional status will improve Outcome: Progressing Goal: Mental status will improve Outcome: Progressing Goal: Verbalization of understanding the information provided will improve Outcome: Progressing

## 2023-06-17 NOTE — Plan of Care (Signed)
Problem: Education: Goal: Knowledge of [facility] Education information/materials will improve Outcome: Progressing   Problem: Coping: Goal: Ability to verbalize frustrations and anger appropriately will improve Outcome: Progressing Goal: Ability to demonstrate self-control will improve Outcome: Progressing

## 2023-06-18 DIAGNOSIS — F332 Major depressive disorder, recurrent severe without psychotic features: Secondary | ICD-10-CM | POA: Diagnosis not present

## 2023-06-18 MED ORDER — CLONIDINE HCL 0.1 MG PO TABS
0.1000 mg | ORAL_TABLET | Freq: Once | ORAL | Status: AC
  Administered 2023-06-18: 0.1 mg via ORAL
  Filled 2023-06-18 (×2): qty 1

## 2023-06-18 NOTE — Progress Notes (Cosign Needed Addendum)
He [facility] MD Progress Note  06/18/2023 8:37 AM Jeffery Wheeler  MRN:  [id number] Principal Problem: MDD (major depressive disorder) Diagnosis: Principal Problem:   MDD (major depressive disorder)  Reason for admission:  Jeffery Wheeler is a 58 y.o. African-American male with prior psychiatric diagnoses significant for hopelessness, alcohol use disorder, cocaine use disorder who presents voluntarily to [facility] for complaint of worsening depression resulting in suicidal ideation with plan to overdose with medications or die by "SI by cops" in the context of polysubstance usage.     Subjective: On assessment today, the patient reports attending group sessions on the unit, finding them beneficial, particularly in discussions about setting goals and adherence to them.  He acknowledges a recent episode where he felt suicidal, attributing it to challenges in seeking assistance for his drug and alcohol issues, feeling that both the hospital and [facility] were unresponsive to his needs.  Currently, he denies any suicidal ideation, self-harm urges, or psychotic symptoms.  He describes his appetite as fine.  Regarding sleep, he reports being up and down throughout the previous night, even after taking medication. He is uncertain if his depression has changed since admission, expressing frustration over losing his phone, which has hindered his ability to contact someone to bring his personal belongings.  Nursing staff reports that the patient was frequently awake during the night and presented with a flat affect and irritability.  CIWA protocol is in place; he required as needed administrations of Tylenol, hydroxyzine, and trazodone.  His blood pressure reading were elevated, in the range of 150s/90s, for which she is receiving Norvasc.  He denies experiencing any cardiac symptoms or side effects from his psychotropic medications. He also denies tremors. Clinically he appears to be  gradually improving, and no medication adjustments are warranted today.   Discharge is tentatively planned for Monday, February 17 to [facility].    Total Time spent with patient: 45 minutes  Past Psychiatric History: Previous Psych Diagnoses: Abuse, cocaine induced mood disorder, MDD, tobacco use disorder, GAD, alcohol abuse Prior inpatient treatment: Yes at [facility] 23 Current/prior outpatient treatment: Patient denies Prior rehab hx: Yes at [facility] in 2023 Psychotherapy hx: Yes History of suicide: Yes x 1 in 2023, OD with fentanyl History of homicide or aggression: Denies Psychiatric medication history: Patient has been on trial Zoloft, gabapentin, Librium. Psychiatric medication compliance history: Noncompliance due to homelessness Neuromodulation history: Denies Current Psychiatrist: Denies Current therapist: Denies   Past Medical History:  Past Medical History:  Diagnosis Date   Eczema    HLD (hyperlipidemia)    HTN (hypertension)    Type 2 diabetes mellitus (HCC)     Past Surgical History:  Procedure Laterality Date   CERVICAL SPINE SURGERY  2018   right shoulder  1991   Family History:  Family History  Problem Relation Age of Onset   Diabetes type II Sister    Diabetes type II Paternal Grandmother    Diabetes type II Maternal Aunt    Family Psychiatric  History: See H&P Social History:  Social History   Substance and Sexual Activity  Alcohol Use Yes   Alcohol/week: 200.0 standard drinks of alcohol   Types: 80 Cans of beer, 120 Shots of liquor per week   Comment: reports periodically drinking as much as 4x 40 oz malt liquer and 1x fifth of tequila daily as fund permit     Social History   Substance and Sexual  Activity  Drug Use Not Currently   Types: "Crack" cocaine, Methamphetamines, Marijuana, Fentanyl   Comment: Consistantly using crack cocaine almost on a daily basis, cannabis occasionally, other drugs as  presented to him by other drug users    Social History   Socioeconomic History   Marital status: Single    Spouse name: Not on file   Number of children: Not on file   Years of education: Not on file   Highest education level: Not on file  Occupational History   Not on file  Tobacco Use   Smoking status: Every Day    Current packs/day: 0.50    Types: Cigarettes   Smokeless tobacco: Never  Substance and Sexual Activity   Alcohol use: Yes    Alcohol/week: 200.0 standard drinks of alcohol    Types: 80 Cans of beer, 120 Shots of liquor per week    Comment: reports periodically drinking as much as 4x 40 oz malt liquer and 1x fifth of tequila daily as fund permit   Drug use: Not Currently    Types: "Crack" cocaine, Methamphetamines, Marijuana, Fentanyl    Comment: Consistantly using crack cocaine almost on a daily basis, cannabis occasionally, other drugs as presented to him by other drug users   Sexual activity: Yes    Partners: Female  Other Topics Concern   Not on file  Social History Narrative   ** Merged History Encounter **       ** Merged History Encounter **       Social Drivers of Corporate investment banker Strain: Low Risk  (03/21/2023)   Received from [facility]   Overall Financial Resource Strain (CARDIA)    Difficulty of Paying Living Expenses: Not very hard  Food Insecurity: Food Insecurity Present (06/15/2023)   Hunger Vital Sign    Worried About Running Out of Food in the Last Year: Often true    Ran Out of Food in the Last Year: Sometimes true  Transportation Needs: Unmet Transportation Needs (06/15/2023)   PRAPARE - Administrator, Civil Service (Medical): Yes    Lack of Transportation (Non-Medical): Yes  Physical Activity: Not on file  Stress: Not on file  Social Connections: Not on file   Additional Social History:      Sleep: Good  Appetite:  Good  Current Medications: Current Facility-Administered Medications  Medication Dose  Route Frequency Provider Last Rate Last Admin   acetaminophen (TYLENOL) tablet 650 mg  650 mg Oral Q6H PRN Motley-Mangrum, Jadeka A, PMHNP   650 mg at 06/18/23 0808   alum & mag hydroxide-simeth (MAALOX/MYLANTA) 200-200-20 MG/5ML suspension 30 mL  30 mL Oral Q4H PRN Motley-Mangrum, Jadeka A, PMHNP       amLODipine (NORVASC) tablet 10 mg  10 mg Oral Daily Motley-Mangrum, Jadeka A, PMHNP   10 mg at 06/18/23 0806   haloperidol (HALDOL) tablet 5 mg  5 mg Oral TID PRN Motley-Mangrum, Jadeka A, PMHNP       And   diphenhydrAMINE (BENADRYL) capsule 50 mg  50 mg Oral TID PRN Motley-Mangrum, Jadeka A, PMHNP       haloperidol lactate (HALDOL) injection 5 mg  5 mg Intramuscular TID PRN Motley-Mangrum, Jadeka A, PMHNP       And   diphenhydrAMINE (BENADRYL) injection 50 mg  50 mg Intramuscular TID PRN Motley-Mangrum, Jadeka A, PMHNP       And   LORazepam (ATIVAN) injection 2 mg  2 mg Intramuscular TID PRN Motley-Mangrum, Jadeka A,  PMHNP       haloperidol lactate (HALDOL) injection 10 mg  10 mg Intramuscular TID PRN Motley-Mangrum, Jadeka A, PMHNP       And   diphenhydrAMINE (BENADRYL) injection 50 mg  50 mg Intramuscular TID PRN Motley-Mangrum, Jadeka A, PMHNP       And   LORazepam (ATIVAN) injection 2 mg  2 mg Intramuscular TID PRN Motley-Mangrum, Geralynn Ochs A, PMHNP       folic acid (FOLVITE) tablet 1 mg  1 mg Oral Daily Motley-Mangrum, Jadeka A, PMHNP   1 mg at 06/18/23 0806   hydrOXYzine (ATARAX) tablet 25 mg  25 mg Oral TID PRN Motley-Mangrum, Jadeka A, PMHNP   25 mg at 06/17/23 2124   influenza vac split trivalent PF (FLULAVAL) injection 0.5 mL  0.5 mL Intramuscular Tomorrow-1000 Attiah, Nadir, MD       magnesium hydroxide (MILK OF MAGNESIA) suspension 30 mL  30 mL Oral Daily PRN Motley-Mangrum, Jadeka A, PMHNP       metFORMIN (GLUCOPHAGE) tablet 850 mg  850 mg Oral BID WC Motley-Mangrum, Jadeka A, PMHNP   850 mg at 06/18/23 0806   multivitamin with minerals tablet 1 tablet  1 tablet Oral Daily  Motley-Mangrum, Jadeka A, PMHNP   1 tablet at 06/18/23 1610   pneumococcal 20-valent conjugate vaccine (PREVNAR 20) injection 0.5 mL  0.5 mL Intramuscular Tomorrow-1000 Nkwenti, Doris, NP       sertraline (ZOLOFT) tablet 50 mg  50 mg Oral Daily Ntuen, Tina C, FNP   50 mg at 06/18/23 9604   thiamine (Vitamin B-1) tablet 100 mg  100 mg Oral Daily Motley-Mangrum, Jadeka A, PMHNP   100 mg at 06/17/23 5409   Or   thiamine (VITAMIN B1) injection 100 mg  100 mg Intravenous Daily Motley-Mangrum, Jadeka A, PMHNP   100 mg at 06/18/23 0809   traZODone (DESYREL) tablet 50 mg  50 mg Oral QHS PRN Motley-Mangrum, Jadeka A, PMHNP   50 mg at 06/17/23 2124   vitamin D3 (CHOLECALCIFEROL) tablet 1,000 Units  1,000 Units Oral Daily Ntuen, Jesusita Oka, FNP   1,000 Units at 06/18/23 8119   Lab Results:  Results for orders placed or performed during the hospital encounter of 06/15/23 (from the past 48 hours)  Vitamin B12     Status: None   Collection Time: 06/16/23  6:13 PM  Result Value Ref Range   Vitamin B-12 507 180 - 914 pg/mL    Comment: (NOTE) This assay is not validated for testing neonatal or myeloproliferative syndrome specimens for Vitamin B12 levels. Performed at [facility], [address], [place], [place] [address]   TSH     Status: None   Collection Time: 06/16/23  6:13 PM  Result Value Ref Range   TSH 1.833 0.350 - 4.500 uIU/mL    Comment: Performed by a 3rd Generation assay with a functional sensitivity of <=0.01 uIU/mL. Performed at [facility], [address], [place], [place] [address]   VITAMIN D 25 Hydroxy (Vit-D Deficiency, Fractures)     Status: Abnormal   Collection Time: 06/16/23  6:13 PM  Result Value Ref Range   Vit D, 25-Hydroxy 20.60 (L) 30 - 100 ng/mL    Comment: (NOTE) Vitamin D deficiency has been defined by the Institute of Medicine  and an Endocrine Society practice guideline as a level of serum 25-OH  vitamin D less than 20 ng/mL  (1,2). The Endocrine Society went on to  further define vitamin D insufficiency  as a level between 21 and 29  ng/mL (2).  1. IOM (Institute of Medicine). 2010. Dietary reference intakes for  calcium and D. [place]: [employer]. 2. Holick MF, Binkley [place], Bischoff-Ferrari HA, et al. Evaluation,  treatment, and prevention of vitamin D deficiency: an Endocrine  Society clinical practice guideline, JCEM. 2011 Jul; 96(7): 1911-30.  Performed at [facility], [address], [place], [place] [address]     Blood Alcohol level:  Lab Results  Component Value Date   [facility] <10 06/14/2023   ETH <10 11/09/2022    Metabolic Disorder Labs: Lab Results  Component Value Date   HGBA1C 7.4 (H) 02/23/2022   MPG 165.68 02/23/2022   No results found for: "PROLACTIN" Lab Results  Component Value Date   CHOL 140 02/23/2022   TRIG 64 02/23/2022   HDL 42 02/23/2022   CHOLHDL 3.3 02/23/2022   VLDL 13 02/23/2022   LDLCALC 85 02/23/2022   LDLCALC 76 02/22/2022   Physical Findings: AIMS:  , ,  ,  ,    CIWA:  CIWA-Ar Total: 0 COWS:     Musculoskeletal: Strength & Muscle Tone: within normal limits Gait & Station: normal Patient leans: N/A  Psychiatric Specialty Exam:  Presentation  General Appearance:  Appropriate for Environment; Casual  Eye Contact: Good  Speech: Clear and Coherent  Speech Volume: Normal  Handedness: Right  Mood and Affect  Mood: Anxious; Depressed  Affect: Congruent  Thought Process  Thought Processes: Coherent  Descriptions of Associations:Intact  Orientation:Full (Time, Place and Person)  Thought Content:Logical  History of Schizophrenia/Schizoaffective disorder:No  Duration of Psychotic Symptoms:N/A  Hallucinations:Hallucinations: Visual Description of Visual Hallucinations: Seeing shadows at the corner of his eyes  Ideas of Reference:None  Suicidal Thoughts:Suicidal Thoughts: No  Homicidal  Thoughts:Homicidal Thoughts: No  Sensorium  Memory: Immediate Good; Recent Good  Judgment: Fair  Insight: Fair  Art therapist  Concentration: Good  Attention Span: Good  Recall: Good  Fund of Knowledge: Good  Language: Good  Psychomotor Activity  Psychomotor Activity: Psychomotor Activity: Normal  Assets  Assets: Communication Skills; Physical Health; Resilience  Sleep  Sleep: Sleep: Good Number of Hours of Sleep: 12  Physical Exam: Physical Exam Vitals and nursing note reviewed.  Constitutional:      General: He is not in acute distress. HENT:     Head: Normocephalic.     Nose: Nose normal.     Mouth/Throat:     Mouth: Mucous membranes are moist.     Pharynx: Oropharynx is clear.  Eyes:     Extraocular Movements: Extraocular movements intact.  Cardiovascular:     Rate and Rhythm: Normal rate.     Pulses: Normal pulses.  Pulmonary:     Effort: Pulmonary effort is normal.  Abdominal:     Comments: Deferred  Genitourinary:    Comments: Deferred Musculoskeletal:        General: Normal range of motion.     Cervical back: Normal range of motion.  Skin:    General: Skin is warm.  Neurological:     General: No focal deficit present.     Mental Status: He is alert and oriented to person, place, and time.  Psychiatric:        Mood and Affect: Mood normal.        Behavior: Behavior normal.        Thought Content: Thought content normal.    Review of Systems  Constitutional:  Negative for chills and fever.  HENT:  Negative for sore throat.   Eyes:  Negative for blurred vision.  Respiratory:  Negative for cough, sputum production, shortness of breath and wheezing.   Cardiovascular:  Negative for chest pain and palpitations.  Gastrointestinal:  Negative for heartburn, nausea and vomiting.  Genitourinary:  Negative for dysuria, frequency and urgency.  Musculoskeletal:  Positive for back pain.  Skin:  Negative for itching and rash.   Neurological:  Negative for dizziness, tingling, tremors and headaches.  Endo/Heme/Allergies:        See allergy listing  Psychiatric/Behavioral:  Positive for depression. The patient is nervous/anxious.    Blood pressure (!) 147/80, pulse 80, temperature 98.2 F (36.8 C), temperature source Oral, resp. rate 16, height 5\' 7"  (1.702 m), weight 85.5 kg, SpO2 98%. Body mass index is 29.51 kg/m.  Treatment Plan Summary: Daily contact with patient to assess and evaluate symptoms and progress in treatment and Medication management Treatment Plan Summary: Daily contact with patient to assess and evaluate symptoms and progress in treatment, Medication management.  Physician Treatment Plan for Primary Diagnosis:  Assessment: MDD (major depressive disorder)   Medication: -- Continue Zoloft tablet 50 mg p.o. daily for depression and anxiety --Continue trazodone tablet 50 mg p.o. daily as needed nightly for insomnia --Continue hydroxyzine tablet 25 mg p.o. 3 times daily as needed for anxiety   CIWA protocol: See MAR   Agitation protocol: Benadryl capsule 50 mg p.o. or IM 3 times daily as needed agitation   Haldol tablets 5 mg po IM 3 times daily as needed agitation   Lorazepam tablet 2 mg p.o. or IM 3 times daily as needed agitation     Other PRN Medications -Acetaminophen 650 mg every 6 as needed/mild pain -Maalox 30 mL oral every 4 as needed/digestion -Magnesium hydroxide 30 mL daily as needed/mild constipation   --The risks/benefits/side-effects/alternatives to this medication were discussed in detail with the patient and time was given for questions. The patient consents to medication trial.  -- Metabolic profile and EKG monitoring obtained while on an atypical antipsychotic (BMI: Lipid Panel: HbgA1c: QTc:)  -- Encouraged patient to participate in unit milieu and in scheduled group therapies    Admission labs reviewed: CMP: CO2 21 low, glucose 171 elevated, BUN 22 elevated, calcium  8.4 low.  Otherwise normal.  CBC with differentials: WBC 12.4 elevated, hemoglobin 10.9 low, HCT 35.6 low, MCH 24.9 low, platelets 429 high, RDW 18.8 elevated, neutrophils 8.8 elevated, monocyte 1.1 elevated, otherwise normal.  UDS: Positive for benzodiazepines, positive for cocaine, positive for marijuana.   New labs ordered: TSH: 1.833 WNL, vitamin B-12: 507 WNL.  vitamin D 25-hydroxy: 20.60 low.  EKG reviewed: Normal sinus rhythm, ventricular rate 72, QT/QTc :392/429   Safety and Monitoring: Voluntary admission to inpatient psychiatric unit for safety, stabilization and treatment Daily contact with patient to assess and evaluate symptoms and progress in treatment Patient's case to be discussed in multi-disciplinary team meeting Observation Level : q15 minute checks Vital signs: q12 hours Precautions: suicide, but pt currently verbally contracts for safety on unit    Discharge Planning: Social work and case management to assist with discharge planning and identification of hospital follow-up needs prior to discharge Estimated LOS: 5-7 days Discharge Concerns: Need to establish a safety plan; Medication compliance and effectiveness Discharge Goals: Return home with outpatient referrals for mental health follow-up including medication management/psychotherapy.   Long Term Goal(s): Improvement in symptoms so as ready for discharge   Short Term Goals: Ability to identify changes in  lifestyle to reduce recurrence of condition will improve, Ability to verbalize feelings will improve, Ability to disclose and discuss suicidal ideas, Ability to demonstrate self-control will improve, Ability to identify and develop effective coping behaviors will improve, Ability to maintain clinical measurements within normal limits will improve, Compliance with prescribed medications will improve, and Ability to identify triggers associated with substance abuse/mental health issues will improve   Physician  Treatment Plan for Secondary Diagnosis: Principal Problem:   MDD (major depressive disorder)  I certify that inpatient services furnished can reasonably be expected to improve the patient's condition.       Norma Fredrickson, NP 06/18/2023, 8:37 AMPatient ID: Jeffery Wheeler, male   DOB: 1965/05/23, 58 y.o.   MRN: [id number]

## 2023-06-18 NOTE — Plan of Care (Signed)
Pt presents with flat expression and irritable affect, but remains cooperative. Complains of depression rated 10/10 and frequent awakenings during the night. Denies SI, HI, AVH. Physical pain in R knee rated 7/10 that was reduced to 0 with PRN pain medication. Denies symptoms of active withdrawal at this time. Pt did not attend morning group but attended and participated in afternoon group. Medication compliant with no adverse reactions. Safety checks maintained at q15 minutes. Support, encouragement, and reassurance offered to the pt.   Problem: Education: Goal: Verbalization of understanding the information provided will improve Outcome: Progressing   Problem: Activity: Goal: Interest or engagement in activities will improve Outcome: Progressing   Problem: Safety: Goal: Periods of time without injury will increase Outcome: Progressing   Problem: Education: Goal: Mental status will improve Outcome: Not Progressing

## 2023-06-18 NOTE — Progress Notes (Incomplete)
Patient is gradually coming off multiple psychoactive substances.  He is tolerating his medications well.  He has a bed at [facility] for next week Monday.  We will maintain his current regimen.

## 2023-06-19 DIAGNOSIS — F332 Major depressive disorder, recurrent severe without psychotic features: Secondary | ICD-10-CM

## 2023-06-19 NOTE — Progress Notes (Signed)
Patient seems to be craving for psychoactive substances.  He requested to leave and change his mind a couple of times.  He finally agreed to stay and transition from here to [facility] Monday.  No overt mood symptoms.  No dangerousness.  He is tolerating his medications well. We will maintain his current regimen.

## 2023-06-19 NOTE — Progress Notes (Signed)
   06/19/23 0910  Psych Admission Type (Psych Patients Only)  Admission Status Voluntary  Psychosocial Assessment  Patient Complaints Depression  Eye Contact Fair  Facial Expression Flat;Worried  Affect Depressed;Irritable  Speech Logical/coherent  Interaction Assertive  Motor Activity Slow  Appearance/Hygiene Unremarkable  Behavior Characteristics Cooperative  Mood Depressed;Irritable  Thought Process  Coherency WDL  Content WDL  Delusions None reported or observed  Perception WDL  Hallucination None reported or observed  Judgment Impaired  Confusion None  Danger to Self  Current suicidal ideation? Denies  Agreement Not to Harm Self Yes  Description of Agreement Verbal  Danger to Others  Danger to Others None reported or observed

## 2023-06-19 NOTE — Progress Notes (Signed)
[facility] MD Progress Note  06/19/2023 2:24 PM Jeffery Wheeler  MRN:  [id number] Principal Problem: MDD (major depressive disorder) Diagnosis: Principal Problem:   MDD (major depressive disorder)  Reason for admission:  Jeffery Wheeler is a 58 y.o. African-American male with prior psychiatric diagnoses significant for hopelessness, alcohol use disorder, cocaine use disorder who presents voluntarily to [facility] for complaint of worsening depression resulting in suicidal ideation with plan to overdose with medications or die by "SI by cops" in the context of polysubstance usage.       Case was discussed in the multidisciplinary team meeting. Nursing staff report that the patient slept 7.75 hours last night and received as needed doses of Tylenol and trazodone. His blood pressure was elevated last night at 165/91, he is scheduled routine Norvasc for hypertension.    Subjective: Patient evaluated face-to-face by this provider and chart reviewed on 06/19/23. On assessment today, the patient reports difficulty maintaining sleep due to racing thoughts. He reports no improvement in his depression and anxiety levels. He states that his appetite remains good.  Patient stated he did not attend any group sessions on the unit yesterday, expressing a desire to avoid social interaction.  He was encouraged to participate in group therapy sessions on the unit.  I discussed his elevated blood pressure with him, and he denies experiencing any cardiac symptoms at this time.  He denies side effects from his psychotropic medications. He also denies tremors.   The patient continues to experience significant sleep disturbances due to racing thoughts, with no reported improvement in depression and anxiety symptoms primarily due to his inability to contact someone to bring his personal belongings in preparation for his stay at the recovery center after discharge. The patient's affect is flat, he appears isolated.  There are some suspicion of cravings. Will continue with the tentative plan for discharge Monday, February 17 to [facility].     Social Work Update: Social work reports that the patient has been ambivalent about discharge, stating that he would stay until Monday if he could contact his ex-girlfriend to obtain his aunt's number and have her bring his clothes. He was reportedly able to contact someone to bring his belongings and now expresses a desire to stay and follow through with the plan to discharge to [facility] on Monday.    Total Time spent with patient: 45 minutes  Past Psychiatric History: Previous Psych Diagnoses: Abuse, cocaine induced mood disorder, MDD, tobacco use disorder, GAD, alcohol abuse Prior inpatient treatment: Yes at [facility] Current/prior outpatient treatment: Patient denies Prior rehab hx: Yes at [place] [facility] in 2023 Psychotherapy hx: Yes History of suicide: Yes x 1 in 2023, OD with fentanyl History of homicide or aggression: Denies Psychiatric medication history: Patient has been on trial Zoloft, gabapentin, Librium. Psychiatric medication compliance history: Noncompliance due to homelessness Neuromodulation history: Denies Current Psychiatrist: Denies Current therapist: Denies   Past Medical History:  Past Medical History:  Diagnosis Date   Eczema    HLD (hyperlipidemia)    HTN (hypertension)    Type 2 diabetes mellitus (HCC)     Past Surgical History:  Procedure Laterality Date   CERVICAL SPINE SURGERY  2018   right shoulder  1991   Family History:  Family History  Problem Relation Age of Onset   Diabetes type II Sister    Diabetes type II Paternal Grandmother    Diabetes type II Maternal Aunt  Family Psychiatric  History: See H&P Social History:  Social History   Substance and Sexual Activity  Alcohol Use Yes   Alcohol/week: 200.0 standard drinks of alcohol   Types: 80 Cans of beer, 120  Shots of liquor per week   Comment: reports periodically drinking as much as 4x 40 oz malt liquer and 1x fifth of tequila daily as fund permit     Social History   Substance and Sexual Activity  Drug Use Not Currently   Types: "Crack" cocaine, Methamphetamines, Marijuana, Fentanyl   Comment: Consistantly using crack cocaine almost on a daily basis, cannabis occasionally, other drugs as presented to him by other drug users    Social History   Socioeconomic History   Marital status: Single    Spouse name: Not on file   Number of children: Not on file   Years of education: Not on file   Highest education level: Not on file  Occupational History   Not on file  Tobacco Use   Smoking status: Every Day    Current packs/day: 0.50    Types: Cigarettes   Smokeless tobacco: Never  Substance and Sexual Activity   Alcohol use: Yes    Alcohol/week: 200.0 standard drinks of alcohol    Types: 80 Cans of beer, 120 Shots of liquor per week    Comment: reports periodically drinking as much as 4x 40 oz malt liquer and 1x fifth of tequila daily as fund permit   Drug use: Not Currently    Types: "Crack" cocaine, Methamphetamines, Marijuana, Fentanyl    Comment: Consistantly using crack cocaine almost on a daily basis, cannabis occasionally, other drugs as presented to him by other drug users   Sexual activity: Yes    Partners: Female  Other Topics Concern   Not on file  Social History Narrative   ** Merged History Encounter **       ** Merged History Encounter **       Social Drivers of Corporate investment banker Strain: Low Risk  (03/21/2023)   Received from [facility]   Overall Financial Resource Strain (CARDIA)    Difficulty of Paying Living Expenses: Not very hard  Food Insecurity: Food Insecurity Present (06/15/2023)   Hunger Vital Sign    Worried About Running Out of Food in the Last Year: Often true    Ran Out of Food in the Last Year: Sometimes true  Transportation Needs:  Unmet Transportation Needs (06/15/2023)   PRAPARE - Administrator, Civil Service (Medical): Yes    Lack of Transportation (Non-Medical): Yes  Physical Activity: Not on file  Stress: Not on file  Social Connections: Not on file   Additional Social History:      Sleep: Good  Appetite:  Good  Current Medications: Current Facility-Administered Medications  Medication Dose Route Frequency Provider Last Rate Last Admin   acetaminophen (TYLENOL) tablet 650 mg  650 mg Oral Q6H PRN Motley-Mangrum, Jadeka A, PMHNP   650 mg at 06/18/23 2149   alum & mag hydroxide-simeth (MAALOX/MYLANTA) 200-200-20 MG/5ML suspension 30 mL  30 mL Oral Q4H PRN Motley-Mangrum, Jadeka A, PMHNP       amLODipine (NORVASC) tablet 10 mg  10 mg Oral Daily Motley-Mangrum, Jadeka A, PMHNP   10 mg at 06/19/23 0835   haloperidol (HALDOL) tablet 5 mg  5 mg Oral TID PRN Motley-Mangrum, Jadeka A, PMHNP       And   diphenhydrAMINE (BENADRYL) capsule 50 mg  50 mg  Oral TID PRN Motley-Mangrum, Geralynn Ochs A, PMHNP       haloperidol lactate (HALDOL) injection 5 mg  5 mg Intramuscular TID PRN Motley-Mangrum, Jadeka A, PMHNP       And   diphenhydrAMINE (BENADRYL) injection 50 mg  50 mg Intramuscular TID PRN Motley-Mangrum, Jadeka A, PMHNP       And   LORazepam (ATIVAN) injection 2 mg  2 mg Intramuscular TID PRN Motley-Mangrum, Jadeka A, PMHNP       haloperidol lactate (HALDOL) injection 10 mg  10 mg Intramuscular TID PRN Motley-Mangrum, Jadeka A, PMHNP       And   diphenhydrAMINE (BENADRYL) injection 50 mg  50 mg Intramuscular TID PRN Motley-Mangrum, Jadeka A, PMHNP       And   LORazepam (ATIVAN) injection 2 mg  2 mg Intramuscular TID PRN Motley-Mangrum, Geralynn Ochs A, PMHNP       folic acid (FOLVITE) tablet 1 mg  1 mg Oral Daily Motley-Mangrum, Jadeka A, PMHNP   1 mg at 06/19/23 0835   hydrOXYzine (ATARAX) tablet 25 mg  25 mg Oral TID PRN Motley-Mangrum, Jadeka A, PMHNP   25 mg at 06/17/23 2124   influenza vac split trivalent PF  (FLULAVAL) injection 0.5 mL  0.5 mL Intramuscular Tomorrow-1000 Attiah, Nadir, MD       magnesium hydroxide (MILK OF MAGNESIA) suspension 30 mL  30 mL Oral Daily PRN Motley-Mangrum, Jadeka A, PMHNP       metFORMIN (GLUCOPHAGE) tablet 850 mg  850 mg Oral BID WC Motley-Mangrum, Jadeka A, PMHNP   850 mg at 06/19/23 0834   multivitamin with minerals tablet 1 tablet  1 tablet Oral Daily Motley-Mangrum, Jadeka A, PMHNP   1 tablet at 06/19/23 0834   pneumococcal 20-valent conjugate vaccine (PREVNAR 20) injection 0.5 mL  0.5 mL Intramuscular Tomorrow-1000 Nkwenti, Doris, NP       sertraline (ZOLOFT) tablet 50 mg  50 mg Oral Daily Ntuen, Tina C, FNP   50 mg at 06/19/23 0835   thiamine (Vitamin B-1) tablet 100 mg  100 mg Oral Daily Motley-Mangrum, Jadeka A, PMHNP   100 mg at 06/19/23 0630   Or   thiamine (VITAMIN B1) injection 100 mg  100 mg Intravenous Daily Motley-Mangrum, Jadeka A, PMHNP   100 mg at 06/18/23 0809   traZODone (DESYREL) tablet 50 mg  50 mg Oral QHS PRN Motley-Mangrum, Jadeka A, PMHNP   50 mg at 06/18/23 2149   vitamin D3 (CHOLECALCIFEROL) tablet 1,000 Units  1,000 Units Oral Daily Cecilie Lowers, FNP   1,000 Units at 06/19/23 1601   Lab Results:  No results found for this or any previous visit (from the past 48 hours).   Blood Alcohol level:  Lab Results  Component Value Date   ETH <10 06/14/2023   ETH <10 11/09/2022    Metabolic Disorder Labs: Lab Results  Component Value Date   HGBA1C 7.4 (H) 02/23/2022   MPG 165.68 02/23/2022   No results found for: "PROLACTIN" Lab Results  Component Value Date   CHOL 140 02/23/2022   TRIG 64 02/23/2022   HDL 42 02/23/2022   CHOLHDL 3.3 02/23/2022   VLDL 13 02/23/2022   LDLCALC 85 02/23/2022   LDLCALC 76 02/22/2022   Physical Findings: AIMS:  , ,N/A  ,  ,    CIWA:  CIWA-Ar Total: 6 COWS:  N/A   Musculoskeletal: Strength & Muscle Tone: within normal limits Gait & Station: normal Patient leans: N/A  Psychiatric Specialty  Exam:  Presentation  General Appearance:  Casual; Fairly Groomed  Eye Contact: Good  Speech: Clear and Coherent  Speech Volume: Normal  Handedness: Right  Mood and Affect  Mood: Anxious; Depressed  Affect: Congruent  Thought Process  Thought Processes: Coherent; Goal Directed; Linear  Descriptions of Associations:Intact  Orientation:Full (Time, Place and Person)  Thought Content:Logical  History of Schizophrenia/Schizoaffective disorder:No  Duration of Psychotic Symptoms:N/A  Hallucinations:Hallucinations: None   Ideas of Reference:None  Suicidal Thoughts:Suicidal Thoughts: No   Homicidal Thoughts:Homicidal Thoughts: No   Sensorium  Memory: Immediate Good; Recent Good  Judgment: Fair  Insight: Fair  Art therapist  Concentration: Good  Attention Span: Good  Recall: Good  Fund of Knowledge: Good  Language: Good  Psychomotor Activity  Psychomotor Activity: Psychomotor Activity: Normal   Assets  Assets: Communication Skills; Desire for Improvement; Resilience  Sleep  Sleep: Sleep: Fair   Physical Exam: Physical Exam Vitals and nursing note reviewed.  Constitutional:      General: He is not in acute distress. HENT:     Head: Normocephalic.  Cardiovascular:     Rate and Rhythm: Normal rate.     Pulses: Normal pulses.  Pulmonary:     Effort: Pulmonary effort is normal.  Abdominal:     Comments: Deferred  Genitourinary:    Comments: Deferred Musculoskeletal:        General: Normal range of motion.     Cervical back: Normal range of motion.  Skin:    General: Skin is warm.  Neurological:     General: No focal deficit present.     Mental Status: He is alert and oriented to person, place, and time.    Review of Systems  Constitutional:  Negative for chills and fever.  HENT:  Negative for sore throat.   Eyes:  Negative for blurred vision.  Respiratory:  Negative for cough, sputum production, shortness of  breath and wheezing.   Cardiovascular:  Negative for chest pain and palpitations.  Gastrointestinal:  Negative for diarrhea, heartburn, nausea and vomiting.  Genitourinary:  Negative for dysuria, frequency and urgency.  Skin:  Negative for itching and rash.  Neurological:  Negative for dizziness, tingling, tremors and headaches.  Endo/Heme/Allergies:        See allergy listing  Psychiatric/Behavioral:  Positive for depression. The patient is nervous/anxious.    Blood pressure (!) 144/83, pulse 65, temperature 98.5 F (36.9 C), temperature source Oral, resp. rate 20, height 5\' 7"  (1.702 m), weight 85.5 kg, SpO2 100%. Body mass index is 29.51 kg/m.  Treatment Plan Summary: Daily contact with patient to assess and evaluate symptoms and progress in treatment and Medication management Treatment Plan Summary: Daily contact with patient to assess and evaluate symptoms and progress in treatment, Medication management.  Physician Treatment Plan for Primary Diagnosis:  Assessment: MDD (major depressive disorder)   Medication: -- Continue Zoloft tablet 50 mg p.o. daily for depression and anxiety --Continue trazodone tablet 50 mg p.o. daily as needed nightly for insomnia --Continue hydroxyzine tablet 25 mg p.o. 3 times daily as needed for anxiety   CIWA protocol: See MAR   Agitation protocol: Benadryl capsule 50 mg p.o. or IM 3 times daily as needed agitation   Haldol tablets 5 mg po IM 3 times daily as needed agitation   Lorazepam tablet 2 mg p.o. or IM 3 times daily as needed agitation     Other PRN Medications -Acetaminophen 650 mg every 6 as needed/mild pain -Maalox 30 mL oral every 4 as needed/digestion -Magnesium hydroxide 30 mL  daily as needed/mild constipation   --The risks/benefits/side-effects/alternatives to this medication were discussed in detail with the patient and time was given for questions. The patient consents to medication trial.  -- Metabolic profile and EKG  monitoring obtained while on an atypical antipsychotic (BMI: Lipid Panel: HbgA1c: QTc:)  -- Encouraged patient to participate in unit milieu and in scheduled group therapies    Admission labs reviewed: CMP: CO2 21 low, glucose 171 elevated, BUN 22 elevated, calcium 8.4 low.  Otherwise normal.  CBC with differentials: WBC 12.4 elevated, hemoglobin 10.9 low, HCT 35.6 low, MCH 24.9 low, platelets 429 high, RDW 18.8 elevated, neutrophils 8.8 elevated, monocyte 1.1 elevated, otherwise normal.  UDS: Positive for benzodiazepines, positive for cocaine, positive for marijuana.   New labs ordered: TSH: 1.833 WNL, vitamin B-12: 507 WNL.  vitamin D 25-hydroxy: 20.60 low.  EKG reviewed: Normal sinus rhythm, ventricular rate 72, QT/QTc :392/429   Safety and Monitoring: Voluntary admission to inpatient psychiatric unit for safety, stabilization and treatment Daily contact with patient to assess and evaluate symptoms and progress in treatment Patient's case to be discussed in multi-disciplinary team meeting Observation Level : q15 minute checks Vital signs: q12 hours Precautions: suicide, but pt currently verbally contracts for safety on unit    Discharge Planning: Social work and case management to assist with discharge planning and identification of hospital follow-up needs prior to discharge Estimated LOS: 5-7 days Discharge Concerns: Need to establish a safety plan; Medication compliance and effectiveness Discharge Goals: Return home with outpatient referrals for mental health follow-up including medication management/psychotherapy.   Long Term Goal(s): Improvement in symptoms so as ready for discharge   Short Term Goals: Ability to identify changes in lifestyle to reduce recurrence of condition will improve, Ability to verbalize feelings will improve, Ability to disclose and discuss suicidal ideas, Ability to demonstrate self-control will improve, Ability to identify and develop effective coping  behaviors will improve, Ability to maintain clinical measurements within normal limits will improve, Compliance with prescribed medications will improve, and Ability to identify triggers associated with substance abuse/mental health issues will improve   Physician Treatment Plan for Secondary Diagnosis: Principal Problem:   MDD (major depressive disorder)  I certify that inpatient services furnished can reasonably be expected to improve the patient's condition.       Norma Fredrickson, NP 06/19/2023, 2:24 PMPatient ID: Jeffery Wheeler, male   DOB: 18-Oct-1965, 58 y.o.   MRN: [id number] Patient ID: Jeffery Wheeler, male   DOB: 1965-09-28, 58 y.o.   MRN: [id number]

## 2023-06-19 NOTE — Group Note (Signed)
LCSW Group Therapy Note   Group Date: 06/19/2023 Start Time: 1100 End Time: 1200   Participation:  patient was present and actively participated in the conversation.   Type of Therapy:  Group Therapy  Title:  "Shining from Within: Confidence and Self-Love Journey"  Objective:  The focus of today's session is to explore how confidence and self-love can be nurtured over time through self-compassion, recognizing strengths, and taking small, intentional steps.  Goals: Foster self-love and acceptance by embracing strengths and imperfections. Develop confidence through actionable steps and mindset shifts. Practice patience during personal growth, acknowledging setbacks as part of the journey.  Summary:  This session focused on self-love as the foundation for confidence.  Participants practiced helpful self-talk, identified strengths, set small goals, and reflected on achievements and social support.  It emphasized that building confidence is a continuous process requiring patience and self-care.  Therapeutic Modalities: Cognitive Behavioral Therapy (CBT): Used to challenge and replace unhelpful self-talk with more supportive thoughts, enhancing self-esteem. Mindfulness and Self-Compassion Practices: Encourages reflection on strengths, gratitude, and the creation of a positive environment to foster a sense of well-being.   Alla Feeling, LCSWA 06/19/2023  7:07 PM

## 2023-06-19 NOTE — Progress Notes (Signed)
   06/18/23 2130  Psych Admission Type (Psych Patients Only)  Admission Status Voluntary  Psychosocial Assessment  Patient Complaints Depression (Pt was lying in bed on approach. He stated the pain in his knee is gone if he's lying down.  BP was 175/92 and prn given per provider (see MAR))  Eye Contact Fair  Facial Expression Worried;Pensive;Other (Comment) (Intense)  Affect Depressed;Appropriate to circumstance  Speech Logical/coherent  Interaction Assertive;Guarded;Cautious;Minimal  Motor Activity Slow  Appearance/Hygiene Unremarkable  Behavior Characteristics Cooperative;Calm  Mood Depressed;Despair;Preoccupied (Pt stated, "I'm depressed because I got things going on the outside of here that I can't do anything about." Pt was cautiously guarded and shared minimally.)  Thought Process  Coherency WDL  Content WDL  Delusions None reported or observed  Perception WDL  Hallucination None reported or observed  Judgment Impaired  Confusion None  Danger to Self  Current suicidal ideation? Denies  Self-Injurious Behavior No self-injurious ideation or behavior indicators observed or expressed   Agreement Not to Harm Self Yes  Description of Agreement Verbal  Danger to Others  Danger to Others None reported or observed

## 2023-06-19 NOTE — Plan of Care (Signed)
  Problem: Education: Goal: Emotional status will improve Outcome: Progressing   Problem: Activity: Goal: Sleeping patterns will improve Outcome: Progressing   Problem: Health Behavior/Discharge Planning: Goal: Compliance with treatment plan for underlying cause of condition will improve Outcome: Progressing

## 2023-06-19 NOTE — [facility] Suicide Risk Assessment (Signed)
[facility] INPATIENT:  Family/Significant Other Suicide Prevention Education  Suicide Prevention Education:  Contact Attempts: Ruthy Dick (ex-gf) [phone], (name of family member/significant other) has been identified by the patient as the family member/significant other with whom the patient will be residing, and identified as the person(s) who will aid the patient in the event of a mental health crisis.  With written consent from the patient, two attempts were made to provide suicide prevention education, prior to and/or following the patient's discharge.  We were unsuccessful in providing suicide prevention education.  A suicide education pamphlet was given to the patient to share with family/significant other.  Date and time of first attempt: 06/19/23 @ 1025AM  "Call cannot be completed as dialed" after several rings. Pt reports being unable to get in contact with her either.   Kathi Der 06/19/2023, 10:28 AM

## 2023-06-19 NOTE — [facility] Group Notes (Signed)
Adult Psychoeducational Group Note  Date:  06/19/2023 Time:  9:46 PM  Group Topic/Focus:  Wellness Toolbox:   The focus of this group is to discuss various aspects of wellness, balancing those aspects and exploring ways to increase the ability to experience wellness.  Patients will create a wellness toolbox for use upon discharge.  Participation Level:  Active  Participation Quality:  Appropriate  Affect:  Appropriate  Cognitive:  Appropriate  Insight: Appropriate  Engagement in Group:  Engaged  Modes of Intervention:  Discussion  Additional Comments:   Pt states he's been more depressed and anxious today because he's ready to leave. Pt feeling better currently  Vevelyn Pat 06/19/2023, 9:46 PM

## 2023-06-19 NOTE — Plan of Care (Signed)
  Problem: Activity: Goal: Interest or engagement in activities will improve Outcome: Progressing   Problem: Coping: Goal: Ability to verbalize frustrations and anger appropriately will improve Outcome: Progressing Goal: Ability to demonstrate self-control will improve Outcome: Progressing   Problem: Safety: Goal: Periods of time without injury will increase Outcome: Progressing   Problem: Education: Goal: Mental status will improve Outcome: Progressing

## 2023-06-20 ENCOUNTER — Encounter (HOSPITAL_COMMUNITY): Payer: Self-pay

## 2023-06-20 DIAGNOSIS — F332 Major depressive disorder, recurrent severe without psychotic features: Secondary | ICD-10-CM | POA: Diagnosis not present

## 2023-06-20 MED ORDER — CLONIDINE HCL 0.1 MG PO TABS
0.1000 mg | ORAL_TABLET | Freq: Once | ORAL | Status: AC | PRN
  Administered 2023-06-20: 0.1 mg via ORAL

## 2023-06-20 NOTE — Progress Notes (Signed)
   06/20/23 1300  Psych Admission Type (Psych Patients Only)  Admission Status Voluntary  Psychosocial Assessment  Patient Complaints Depression;Sleep disturbance  Eye Contact Fair  Facial Expression Flat  Affect Irritable;Flat  Speech Logical/coherent  Interaction Assertive  Motor Activity Slow  Appearance/Hygiene Unremarkable  Behavior Characteristics Appropriate to situation  Mood Depressed;Irritable  Thought Process  Coherency WDL  Content WDL  Delusions None reported or observed  Perception WDL  Hallucination None reported or observed  Judgment Poor  Confusion None  Danger to Self  Current suicidal ideation? Denies  Self-Injurious Behavior No self-injurious ideation or behavior indicators observed or expressed   Agreement Not to Harm Self Yes  Description of Agreement verbal contract  Danger to Others  Danger to Others None reported or observed

## 2023-06-20 NOTE — BH IP Treatment Plan (Signed)
Interdisciplinary Treatment and Diagnostic Plan Update  06/20/2023 Time of Session: 1220 - update Jeffery Wheeler MRN: [id number]  Principal Diagnosis: MDD (major depressive disorder)  Secondary Diagnoses: Principal Problem:   MDD (major depressive disorder)   Current Medications:  Current Facility-Administered Medications  Medication Dose Route Frequency Provider Last Rate Last Admin   acetaminophen (TYLENOL) tablet 650 mg  650 mg Oral Q6H PRN Motley-Mangrum, Jadeka A, PMHNP   650 mg at 06/19/23 2123   alum & mag hydroxide-simeth (MAALOX/MYLANTA) 200-200-20 MG/5ML suspension 30 mL  30 mL Oral Q4H PRN Motley-Mangrum, Jadeka A, PMHNP       amLODipine (NORVASC) tablet 10 mg  10 mg Oral Daily Motley-Mangrum, Jadeka A, PMHNP   10 mg at 06/20/23 4401   haloperidol (HALDOL) tablet 5 mg  5 mg Oral TID PRN Motley-Mangrum, Jadeka A, PMHNP       And   diphenhydrAMINE (BENADRYL) capsule 50 mg  50 mg Oral TID PRN Motley-Mangrum, Jadeka A, PMHNP       haloperidol lactate (HALDOL) injection 5 mg  5 mg Intramuscular TID PRN Motley-Mangrum, Jadeka A, PMHNP       And   diphenhydrAMINE (BENADRYL) injection 50 mg  50 mg Intramuscular TID PRN Motley-Mangrum, Jadeka A, PMHNP       And   LORazepam (ATIVAN) injection 2 mg  2 mg Intramuscular TID PRN Motley-Mangrum, Jadeka A, PMHNP       haloperidol lactate (HALDOL) injection 10 mg  10 mg Intramuscular TID PRN Motley-Mangrum, Jadeka A, PMHNP       And   diphenhydrAMINE (BENADRYL) injection 50 mg  50 mg Intramuscular TID PRN Motley-Mangrum, Jadeka A, PMHNP       And   LORazepam (ATIVAN) injection 2 mg  2 mg Intramuscular TID PRN Motley-Mangrum, Geralynn Ochs A, PMHNP       folic acid (FOLVITE) tablet 1 mg  1 mg Oral Daily Motley-Mangrum, Jadeka A, PMHNP   1 mg at 06/20/23 0803   hydrOXYzine (ATARAX) tablet 25 mg  25 mg Oral TID PRN Motley-Mangrum, Jadeka A, PMHNP   25 mg at 06/19/23 2122   influenza vac split trivalent PF (FLULAVAL) injection 0.5 mL  0.5 mL  Intramuscular Tomorrow-1000 Attiah, Nadir, MD       magnesium hydroxide (MILK OF MAGNESIA) suspension 30 mL  30 mL Oral Daily PRN Motley-Mangrum, Jadeka A, PMHNP       metFORMIN (GLUCOPHAGE) tablet 850 mg  850 mg Oral BID WC Motley-Mangrum, Jadeka A, PMHNP   850 mg at 06/20/23 0803   multivitamin with minerals tablet 1 tablet  1 tablet Oral Daily Motley-Mangrum, Jadeka A, PMHNP   1 tablet at 06/20/23 0272   pneumococcal 20-valent conjugate vaccine (PREVNAR 20) injection 0.5 mL  0.5 mL Intramuscular Tomorrow-1000 Nkwenti, Doris, NP       sertraline (ZOLOFT) tablet 50 mg  50 mg Oral Daily Ntuen, Tina C, FNP   50 mg at 06/20/23 0803   thiamine (Vitamin B-1) tablet 100 mg  100 mg Oral Daily Motley-Mangrum, Jadeka A, PMHNP   100 mg at 06/20/23 0802   Or   thiamine (VITAMIN B1) injection 100 mg  100 mg Intravenous Daily Motley-Mangrum, Jadeka A, PMHNP   100 mg at 06/18/23 0809   traZODone (DESYREL) tablet 50 mg  50 mg Oral QHS PRN Motley-Mangrum, Jadeka A, PMHNP   50 mg at 06/19/23 2122   vitamin D3 (CHOLECALCIFEROL) tablet 1,000 Units  1,000 Units Oral Daily Ntuen, Jesusita Oka, FNP   1,000 Units at  06/20/23 0803   PTA Medications: Medications Prior to Admission  Medication Sig Dispense Refill Last Dose/Taking   amLODipine (NORVASC) 10 MG tablet Take 1 tablet (10 mg total) by mouth daily. 30 tablet 0 06/16/2023 Morning   atorvastatin (LIPITOR) 20 MG tablet Take 1 tablet (20 mg total) by mouth daily. 30 tablet 0 06/15/2023   metFORMIN (GLUCOPHAGE) 1000 MG tablet Take 1,000 mg by mouth 2 (two) times daily with a meal.   06/15/2023   chlordiazePOXIDE (LIBRIUM) 25 MG capsule Take 25 mg by mouth 3 (three) times daily. (Patient not taking: Reported on 06/16/2023)   Not Taking   gabapentin (NEURONTIN) 300 MG capsule Take 300 mg by mouth 3 (three) times daily. (Patient not taking: Reported on 06/16/2023)   Not Taking   sertraline (ZOLOFT) 50 MG tablet Take 1 tablet (50 mg total) by mouth daily. (Patient not taking:  Reported on 05/20/2023) 30 tablet 0 Not Taking    Patient Stressors: Financial difficulties   Substance abuse    Patient Strengths: Capable of independent living  Forensic psychologist fund of knowledge   Treatment Modalities: Medication Management, Group therapy, Case management,  1 to 1 session with clinician, Psychoeducation, Recreational therapy.   Physician Treatment Plan for Primary Diagnosis: MDD (major depressive disorder) Long Term Goal(s): Improvement in symptoms so as ready for discharge   Short Term Goals: Ability to identify changes in lifestyle to reduce recurrence of condition will improve Ability to verbalize feelings will improve Ability to disclose and discuss suicidal ideas Ability to demonstrate self-control will improve Ability to identify and develop effective coping behaviors will improve Ability to maintain clinical measurements within normal limits will improve Compliance with prescribed medications will improve Ability to identify triggers associated with substance abuse/mental health issues will improve  Medication Management: Evaluate patient's response, side effects, and tolerance of medication regimen.  Therapeutic Interventions: 1 to 1 sessions, Unit Group sessions and Medication administration.  Evaluation of Outcomes: Progressing  Physician Treatment Plan for Secondary Diagnosis: Principal Problem:   MDD (major depressive disorder)  Long Term Goal(s): Improvement in symptoms so as ready for discharge   Short Term Goals: Ability to identify changes in lifestyle to reduce recurrence of condition will improve Ability to verbalize feelings will improve Ability to disclose and discuss suicidal ideas Ability to demonstrate self-control will improve Ability to identify and develop effective coping behaviors will improve Ability to maintain clinical measurements within normal limits will improve Compliance with prescribed medications will  improve Ability to identify triggers associated with substance abuse/mental health issues will improve     Medication Management: Evaluate patient's response, side effects, and tolerance of medication regimen.  Therapeutic Interventions: 1 to 1 sessions, Unit Group sessions and Medication administration.  Evaluation of Outcomes: Progressing   RN Treatment Plan for Primary Diagnosis: MDD (major depressive disorder) Long Term Goal(s): Knowledge of disease and therapeutic regimen to maintain health will improve  Short Term Goals: Ability to remain free from injury will improve, Ability to participate in decision making will improve, Ability to identify and develop effective coping behaviors will improve, and Compliance with prescribed medications will improve  Medication Management: RN will administer medications as ordered by provider, will assess and evaluate patient's response and provide education to patient for prescribed medication. RN will report any adverse and/or side effects to prescribing provider.  Therapeutic Interventions: 1 on 1 counseling sessions, Psychoeducation, Medication administration, Evaluate responses to treatment, Monitor vital signs and CBGs as ordered, Perform/monitor CIWA, COWS, AIMS and  Fall Risk screenings as ordered, Perform wound care treatments as ordered.  Evaluation of Outcomes: Progressing   LCSW Treatment Plan for Primary Diagnosis: MDD (major depressive disorder) Long Term Goal(s): Safe transition to appropriate next level of care at discharge, Engage patient in therapeutic group addressing interpersonal concerns.  Short Term Goals: Engage patient in aftercare planning with referrals and resources, Increase ability to appropriately verbalize feelings, Facilitate patient progression through stages of change regarding substance use diagnoses and concerns, Identify triggers associated with mental health/substance abuse issues, and Increase skills for wellness  and recovery  Therapeutic Interventions: Assess for all discharge needs, 1 to 1 time with Social worker, Explore available resources and support systems, Assess for adequacy in community support network, Educate family and significant other(s) on suicide prevention, Complete Psychosocial Assessment, Interpersonal group therapy.  Evaluation of Outcomes: Progressing   Progress in Treatment: Attending groups: Yes. Participating in groups: Yes. Taking medication as prescribed: Yes. Toleration medication: Yes. Family/Significant other contact made:  Unable to connect with: Ruthy Dick (ex-gf) [phone] doc, attempted phone not in service Patient understands diagnosis: Yes. Discussing patient identified problems/goals with staff: Yes. Medical problems stabilized or resolved: Yes. Denies suicidal/homicidal ideation: Yes. Issues/concerns per patient self-inventory: No. Other: None   New problem(s) identified: No, Describe:  None   New Short Term/Long Term Goal(s): detox, medication management for mood stabilization; elimination of SI thoughts; development of comprehensive mental wellness/sobriety plan   Patient Goals:  :"Try to get off drugs and alcohol"   Discharge Plan or Barriers: Patient recently admitted. CSW will continue to follow and assess for appropriate referrals and possible discharge planning.      Reason for Continuation of Hospitalization: Anxiety Depression Medication stabilization Withdrawal symptoms Other; describe discharge planning   Estimated Length of Stay: 3 Days, [facility] on Monday 2/17  Last 3 [place] Suicide Severity Risk Score: Flowsheet Row Ad[facility] (Current) [facility]14/2023 [facility] ED from [facility]/19/2023 in [facility] RISK CATEGORY High Risk High Risk No Risk       Last PHQ 2/9 Scores:    02/03/2022     1:07 PM 01/25/2022    1:06 PM 01/24/2022   10:05 AM  Depression screen PHQ 2/9  Decreased Interest 3 3 2   Down, Depressed, Hopeless 3 3 2   PHQ - 2 Score 6 6 4   Altered sleeping 3 1 3   Tired, decreased energy 3 3 2   Change in appetite 0 0 1  Feeling bad or failure about yourself  3 3 2   Trouble concentrating 2 1 1   Moving slowly or fidgety/restless 0 0 0  Suicidal thoughts 2 3 2   PHQ-9 Score 19 17 15   Difficult doing work/chores Extremely dIfficult Somewhat difficult     Scribe for Treatment Team: Kathi Der, Theresia Majors 06/20/2023 2:28 PM

## 2023-06-20 NOTE — [facility] Group Notes (Signed)

## 2023-06-20 NOTE — Plan of Care (Signed)
  Problem: Education: Goal: Knowledge of [facility] Education information/materials will improve Outcome: Not Progressing Goal: Emotional status will improve Outcome: Not Progressing Goal: Verbalization of understanding the information provided will improve Outcome: Not Progressing

## 2023-06-20 NOTE — [facility] Group Notes (Signed)
[facility] Group Notes:  (Nursing/MHT/Case Management/Adjunct)  Date:  06/20/2023  Time:  10:22 PM  Type of Therapy:  Psychoeducational Skills  Participation Level:  Active  Participation Quality:  Appropriate and Attentive  Affect:  Flat  Cognitive:  Appropriate  Insight:  Good  Engagement in Group:  Engaged  Modes of Intervention:  Education  Summary of Progress/Problems: The patient attended the evening speakers meeting for [facility] and was appropriate. He spoke up on multiple occasions and was honest about his substance abuse history. He admits that he needs to change his "way of thinking".   Hazle Coca S 06/20/2023, 10:22 PM

## 2023-06-20 NOTE — Progress Notes (Signed)
[facility] MD Progress Note  06/20/2023 10:09 AM Jeffery Wheeler  MRN:  [id number] Principal Problem: MDD (major depressive disorder) Diagnosis: Principal Problem:   MDD (major depressive disorder)  Reason for admission:  Jeffery Wheeler is a 58 y.o. African-American male with prior psychiatric diagnoses significant for hopelessness, alcohol use disorder, cocaine use disorder who presents voluntarily to [facility] for complaint of worsening depression resulting in suicidal ideation with plan to overdose with medications or die by "SI by cops" in the context of polysubstance usage.    The patient's chart was reviewed and nursing notes were reviewed. Vitals signs: BP146/85. The patient's case was discussed in multidisciplinary team meeting. Per [facility], patient was taking medications appropriately. The following as needed medications were given: tylenol, atarax, trazodone. Pt craving psychoactive substances yesterday and requested to leave but eventually agreed to stay until going to [facility] on Monday and attended group sessions.    Subjective: The patient was seen resting in his room, no acute distress. On assessment, the patient feels "all right" today. Patient feels the group sessions have been fair, stating he went to 1 group yesterday.  He denies cravings for substances.  He reports feeling good about going to [facility] on Monday.  He discussed how he will have to communicate with his support system to get his belongings to the facility.  Patient reports having fair sleep, having difficulty staying asleep.  Patient reports good appetite. Patient feels that the medications have been helpful and denies adverse effects.  Patient denies current SI, HI, AVH.    Social Work Update: Social work reports that the patient has been ambivalent about discharge, stating that he would stay until Monday if he could contact his ex-girlfriend to obtain his aunt's number and have her bring his clothes. He was  reportedly able to contact someone to bring his belongings and now expresses a desire to stay and follow through with the plan to discharge to [facility] on Monday.    Total Time spent with patient: 45 minutes  Past Psychiatric History: Previous Psych Diagnoses: Abuse, cocaine induced mood disorder, MDD, tobacco use disorder, GAD, alcohol abuse Prior inpatient treatment: Yes at [facility] H20 23 Current/prior outpatient treatment: Patient denies Prior rehab hx: Yes at [facility] in 2023 Psychotherapy hx: Yes History of suicide: Yes x 1 in 2023, OD with fentanyl History of homicide or aggression: Denies Psychiatric medication history: Patient has been on trial Zoloft, gabapentin, Librium. Psychiatric medication compliance history: Noncompliance due to homelessness Neuromodulation history: Denies Current Psychiatrist: Denies Current therapist: Denies   Past Medical History:  Past Medical History:  Diagnosis Date   Eczema    HLD (hyperlipidemia)    HTN (hypertension)    Type 2 diabetes mellitus (HCC)     Past Surgical History:  Procedure Laterality Date   CERVICAL SPINE SURGERY  2018   right shoulder  1991   Family History:  Family History  Problem Relation Age of Onset   Diabetes type II Sister    Diabetes type II Paternal Grandmother    Diabetes type II Maternal Aunt    Family Psychiatric  History: See H&P Social History:  Social History   Substance and Sexual Activity  Alcohol Use Yes   Alcohol/week: 200.0 standard drinks of alcohol   Types: 80 Cans of beer, 120 Shots of liquor per week   Comment: reports periodically drinking as much as 4x 40 oz malt liquer and 1x fifth of tequila  daily as fund permit     Social History   Substance and Sexual Activity  Drug Use Not Currently   Types: "Crack" cocaine, Methamphetamines, Marijuana, Fentanyl   Comment: Consistantly using crack cocaine almost on a daily basis, cannabis occasionally,  other drugs as presented to him by other drug users    Social History   Socioeconomic History   Marital status: Single    Spouse name: Not on file   Number of children: Not on file   Years of education: Not on file   Highest education level: Not on file  Occupational History   Not on file  Tobacco Use   Smoking status: Every Day    Current packs/day: 0.50    Types: Cigarettes   Smokeless tobacco: Never  Substance and Sexual Activity   Alcohol use: Yes    Alcohol/week: 200.0 standard drinks of alcohol    Types: 80 Cans of beer, 120 Shots of liquor per week    Comment: reports periodically drinking as much as 4x 40 oz malt liquer and 1x fifth of tequila daily as fund permit   Drug use: Not Currently    Types: "Crack" cocaine, Methamphetamines, Marijuana, Fentanyl    Comment: Consistantly using crack cocaine almost on a daily basis, cannabis occasionally, other drugs as presented to him by other drug users   Sexual activity: Yes    Partners: Female  Other Topics Concern   Not on file  Social History Narrative   ** Merged History Encounter **       ** Merged History Encounter **       Social Drivers of Corporate investment banker Strain: Low Risk  (03/21/2023)   Received from [facility]   Overall Financial Resource Strain (CARDIA)    Difficulty of Paying Living Expenses: Not very hard  Food Insecurity: Food Insecurity Present (06/15/2023)   Hunger Vital Sign    Worried About Running Out of Food in the Last Year: Often true    Ran Out of Food in the Last Year: Sometimes true  Transportation Needs: Unmet Transportation Needs (06/15/2023)   PRAPARE - Administrator, Civil Service (Medical): Yes    Lack of Transportation (Non-Medical): Yes  Physical Activity: Not on file  Stress: Not on file  Social Connections: Not on file    Current Medications: Current Facility-Administered Medications  Medication Dose Route Frequency Provider Last Rate Last Admin    acetaminophen (TYLENOL) tablet 650 mg  650 mg Oral Q6H PRN Motley-Mangrum, Jadeka A, PMHNP   650 mg at 06/19/23 2123   alum & mag hydroxide-simeth (MAALOX/MYLANTA) 200-200-20 MG/5ML suspension 30 mL  30 mL Oral Q4H PRN Motley-Mangrum, Jadeka A, PMHNP       amLODipine (NORVASC) tablet 10 mg  10 mg Oral Daily Motley-Mangrum, Jadeka A, PMHNP   10 mg at 06/20/23 0803   haloperidol (HALDOL) tablet 5 mg  5 mg Oral TID PRN Motley-Mangrum, Jadeka A, PMHNP       And   diphenhydrAMINE (BENADRYL) capsule 50 mg  50 mg Oral TID PRN Motley-Mangrum, Jadeka A, PMHNP       haloperidol lactate (HALDOL) injection 5 mg  5 mg Intramuscular TID PRN Motley-Mangrum, Jadeka A, PMHNP       And   diphenhydrAMINE (BENADRYL) injection 50 mg  50 mg Intramuscular TID PRN Motley-Mangrum, Jadeka A, PMHNP       And   LORazepam (ATIVAN) injection 2 mg  2 mg Intramuscular TID PRN Motley-Mangrum, Geralynn Ochs  A, PMHNP       haloperidol lactate (HALDOL) injection 10 mg  10 mg Intramuscular TID PRN Motley-Mangrum, Jadeka A, PMHNP       And   diphenhydrAMINE (BENADRYL) injection 50 mg  50 mg Intramuscular TID PRN Motley-Mangrum, Jadeka A, PMHNP       And   LORazepam (ATIVAN) injection 2 mg  2 mg Intramuscular TID PRN Motley-Mangrum, Geralynn Ochs A, PMHNP       folic acid (FOLVITE) tablet 1 mg  1 mg Oral Daily Motley-Mangrum, Jadeka A, PMHNP   1 mg at 06/20/23 0803   hydrOXYzine (ATARAX) tablet 25 mg  25 mg Oral TID PRN Motley-Mangrum, Jadeka A, PMHNP   25 mg at 06/19/23 2122   influenza vac split trivalent PF (FLULAVAL) injection 0.5 mL  0.5 mL Intramuscular Tomorrow-1000 Attiah, Nadir, MD       magnesium hydroxide (MILK OF MAGNESIA) suspension 30 mL  30 mL Oral Daily PRN Motley-Mangrum, Jadeka A, PMHNP       metFORMIN (GLUCOPHAGE) tablet 850 mg  850 mg Oral BID WC Motley-Mangrum, Jadeka A, PMHNP   850 mg at 06/20/23 0803   multivitamin with minerals tablet 1 tablet  1 tablet Oral Daily Motley-Mangrum, Jadeka A, PMHNP   1 tablet at 06/20/23 4098    pneumococcal 20-valent conjugate vaccine (PREVNAR 20) injection 0.5 mL  0.5 mL Intramuscular Tomorrow-1000 Nkwenti, Doris, NP       sertraline (ZOLOFT) tablet 50 mg  50 mg Oral Daily Ntuen, Tina C, FNP   50 mg at 06/20/23 0803   thiamine (Vitamin B-1) tablet 100 mg  100 mg Oral Daily Motley-Mangrum, Jadeka A, PMHNP   100 mg at 06/20/23 0802   Or   thiamine (VITAMIN B1) injection 100 mg  100 mg Intravenous Daily Motley-Mangrum, Jadeka A, PMHNP   100 mg at 06/18/23 0809   traZODone (DESYREL) tablet 50 mg  50 mg Oral QHS PRN Motley-Mangrum, Jadeka A, PMHNP   50 mg at 06/19/23 2122   vitamin D3 (CHOLECALCIFEROL) tablet 1,000 Units  1,000 Units Oral Daily Cecilie Lowers, FNP   1,000 Units at 06/20/23 0803   Lab Results:  No results found for this or any previous visit (from the past 48 hours).   Blood Alcohol level:  Lab Results  Component Value Date   ETH <10 06/14/2023   ETH <10 11/09/2022    Metabolic Disorder Labs: Lab Results  Component Value Date   HGBA1C 7.4 (H) 02/23/2022   MPG 165.68 02/23/2022   No results found for: "PROLACTIN" Lab Results  Component Value Date   CHOL 140 02/23/2022   TRIG 64 02/23/2022   HDL 42 02/23/2022   CHOLHDL 3.3 02/23/2022   VLDL 13 02/23/2022   LDLCALC 85 02/23/2022   LDLCALC 76 02/22/2022   Physical Findings: AIMS:  , ,N/A  ,  ,    CIWA:  CIWA-Ar Total: 6 COWS:  N/A   Musculoskeletal: Strength & Muscle Tone: within normal limits Gait & Station: normal Patient leans: N/A  Psychiatric Specialty Exam:  Presentation  General Appearance:  Fairly Groomed; Appropriate for Environment; Casual  Eye Contact: Fair  Speech: Clear and Coherent  Speech Volume: Normal  Handedness: Right  Mood and Affect  Mood: Depressed; Irritable  Affect: Depressed  Thought Process  Thought Processes: Coherent; Linear; Goal Directed  Descriptions of Associations:Intact  Orientation:Full (Time, Place and Person)  Thought  Content:Logical  History of Schizophrenia/Schizoaffective disorder:No  Duration of Psychotic Symptoms:N/A  Hallucinations:Hallucinations: None   Ideas  of Reference:None  Suicidal Thoughts:Suicidal Thoughts: No   Homicidal Thoughts:Homicidal Thoughts: No   Sensorium  Memory: Remote Good  Judgment: Fair  Insight: Fair  Chartered certified accountant: Fair  Attention Span: Fair  Recall: Fair  Fund of Knowledge: Fair  Language: Fair  Psychomotor Activity  Psychomotor Activity: Psychomotor Activity: Normal   Assets  Assets: Resilience; Desire for Improvement  Sleep  Sleep: Sleep: Fair   Physical Exam  General: Pleasant, well-appearing . No acute distress. Pulmonary: Normal effort. No wheezing or rales. Skin: No obvious rash or lesions. Neuro: A&Ox3.No focal deficit.   Review of Systems  No reported symptoms   Blood pressure (!) 146/80, pulse 80, temperature 98.5 F (36.9 C), temperature source Oral, resp. rate 16, height 5\' 7"  (1.702 m), weight 85.5 kg, SpO2 97%. Body mass index is 29.51 kg/m.  Treatment Plan Summary: Daily contact with patient to assess and evaluate symptoms and progress in treatment and Medication management Treatment Plan Summary: Daily contact with patient to assess and evaluate symptoms and progress in treatment, Medication management.  Physician Treatment Plan for Primary Diagnosis:  Assessment: MDD (major depressive disorder)   Medication: -- Continue Zoloft tablet 50 mg p.o. daily for depression and anxiety --Continue trazodone tablet 50 mg p.o. daily as needed nightly for insomnia --Continue hydroxyzine tablet 25 mg p.o. 3 times daily as needed for anxiety   CIWA protocol: See MAR   Agitation protocol: Benadryl capsule 50 mg p.o. or IM 3 times daily as needed agitation   Haldol tablets 5 mg po IM 3 times daily as needed agitation   Lorazepam tablet 2 mg p.o. or IM 3 times daily as needed agitation      Other PRN Medications -Acetaminophen 650 mg every 6 as needed/mild pain -Maalox 30 mL oral every 4 as needed/digestion -Magnesium hydroxide 30 mL daily as needed/mild constipation   --The risks/benefits/side-effects/alternatives to this medication were discussed in detail with the patient and time was given for questions. The patient consents to medication trial.  -- Metabolic profile and EKG monitoring obtained while on an atypical antipsychotic (BMI: Lipid Panel: HbgA1c: QTc:)  -- Encouraged patient to participate in unit milieu and in scheduled group therapies    Admission labs reviewed: CMP: CO2 21 low, glucose 171 elevated, BUN 22 elevated, calcium 8.4 low.  Otherwise normal.  CBC with differentials: WBC 12.4 elevated, hemoglobin 10.9 low, HCT 35.6 low, MCH 24.9 low, platelets 429 high, RDW 18.8 elevated, neutrophils 8.8 elevated, monocyte 1.1 elevated, otherwise normal.  UDS: Positive for benzodiazepines, positive for cocaine, positive for marijuana.   New labs ordered: TSH: 1.833 WNL, vitamin B-12: 507 WNL.  vitamin D 25-hydroxy: 20.60 low.  EKG reviewed: Normal sinus rhythm, ventricular rate 72, QT/QTc :392/429   Safety and Monitoring: Voluntary admission to inpatient psychiatric unit for safety, stabilization and treatment Daily contact with patient to assess and evaluate symptoms and progress in treatment Patient's case to be discussed in multi-disciplinary team meeting Observation Level : q15 minute checks Vital signs: q12 hours Precautions: suicide, but pt currently verbally contracts for safety on unit    Discharge Planning: Social work and case management to assist with discharge planning and identification of hospital follow-up needs prior to discharge Estimated LOS: 5-7 days Discharge Concerns: Need to establish a safety plan; Medication compliance and effectiveness Discharge Goals: [facility] plan for Monday   Long Term Goal(s): Improvement in symptoms so as ready for  discharge   Short Term Goals: Ability to identify changes in lifestyle to  reduce recurrence of condition will improve, Ability to verbalize feelings will improve, Ability to disclose and discuss suicidal ideas, Ability to demonstrate self-control will improve, Ability to identify and develop effective coping behaviors will improve, Ability to maintain clinical measurements within normal limits will improve, Compliance with prescribed medications will improve, and Ability to identify triggers associated with substance abuse/mental health issues will improve   Physician Treatment Plan for Secondary Diagnosis: Principal Problem:   MDD (major depressive disorder)  I certify that inpatient services furnished can reasonably be expected to improve the patient's condition.       Lance Muss, MD 06/20/2023, 10:09 AM Patient ID: Vena Rua, male   DOB: [birth year], 58 y.o.   MRN: [id number]

## 2023-06-21 MED ORDER — MELATONIN 3 MG PO TABS
3.0000 mg | ORAL_TABLET | Freq: Every evening | ORAL | Status: DC | PRN
  Administered 2023-06-21: 3 mg via ORAL
  Filled 2023-06-21: qty 1

## 2023-06-21 MED ORDER — PRAZOSIN HCL 1 MG PO CAPS
1.0000 mg | ORAL_CAPSULE | Freq: Two times a day (BID) | ORAL | Status: DC
  Administered 2023-06-21 – 2023-06-22 (×3): 1 mg via ORAL
  Filled 2023-06-21 (×7): qty 1

## 2023-06-21 NOTE — [facility] Group Notes (Signed)
Pt did not attend goals/orientation group.  

## 2023-06-21 NOTE — Plan of Care (Signed)

## 2023-06-21 NOTE — Group Note (Signed)
[facility] LCSW Group Therapy Note    Group Date: 06/21/2023 Start Time: 1000 End Time: 1120  Type of Therapy and Topic:  Group Therapy:  Change/ Overcoming Obstacles  Participation Level:  [facility] PARTICIPATION LEVEL: None  Mood:  Description of Group:   In this group patients will be encouraged to explore what they see as obstacles to their own wellness and recovery. They will be guided to discuss their thoughts, feelings, and behaviors related to these obstacles. The group will process together ways to cope with barriers, with attention given to specific choices patients can make. Each patient will be challenged to identify changes they are motivated to make in order to overcome their obstacles. This group will be process-oriented, with patients participating in exploration of their own experiences as well as giving and receiving support and challenge from other group members.  Therapeutic Goals: 1. Patient will identify personal and current obstacles as they relate to admission. 2. Patient will identify barriers that currently interfere with their wellness or overcoming obstacles.  3. Patient will identify feelings, thought process and behaviors related to these barriers. 4. Patient will identify two changes they are willing to make to overcome these obstacles:    Summary of Patient Progress   Pt was present and respectful but did not attend   Therapeutic Modalities:   Cognitive Behavioral Therapy Solution Focused Therapy Motivational Interviewing Relapse Prevention Therapy   Steffanie Dunn, LCSW

## 2023-06-21 NOTE — Group Note (Signed)
Date:  06/21/2023 Time:  10:21 PM  Group Topic/Focus:  Wrap-Up Group:   The focus of this group is to help patients review their daily goal of treatment and discuss progress on daily workbooks.    Participation Level:  Active  Participation Quality:  Appropriate and Sharing  Affect:  Appropriate  Cognitive:  Appropriate  Insight: Appropriate  Engagement in Group:  Engaged  Modes of Intervention:  Activity and Socialization  Additional Comments:  Patients used wrap up group sheets. Patient rated his day a 8/10. Patient shared his goal for today, "to stay up til bed time" and patient did achieve his goal, "so far". Patient shared coping skills that he finds most helpful, "looking at tv". Patient participated it activity after sharing.   Jeffery Wheeler 06/21/2023, 10:21 PM

## 2023-06-21 NOTE — Progress Notes (Signed)
D: Patient is alert, oriented, and cooperative. Denies SI, HI, AVH, and verbally contracts for safety. Patient reports he slept fair last night with sleeping medication. He reports the sleeping medication was not helpful. Patient reports his appetite as good, energy level as high, and concentration as poor. Patient rates his depression 8/10, hopelessness 8/10, and anxiety 8/10. Patient reports right knee pain with activity.    A: Scheduled medications administered per MD order. PRN tylenol administered. Support provided. Patient educated on safety on the unit and medications. Routine safety checks every 15 minutes. Patient stated understanding to tell nurse about any new physical symptoms. Patient understands to tell staff of any needs.     R: No adverse drug reactions noted. Patient remains safe at this time and will continue to monitor.    06/21/23 1000  Psych Admission Type (Psych Patients Only)  Admission Status Voluntary  Psychosocial Assessment  Patient Complaints Depression;Sleep disturbance  Eye Contact Fair  Facial Expression Flat  Affect Flat  Speech Logical/coherent  Interaction Assertive  Motor Activity Slow  Appearance/Hygiene Unremarkable  Behavior Characteristics Appropriate to situation;Cooperative  Mood Depressed  Thought Process  Coherency WDL  Content WDL  Delusions None reported or observed  Perception WDL  Hallucination None reported or observed  Judgment Impaired  Confusion None  Danger to Self  Current suicidal ideation? Denies  Self-Injurious Behavior No self-injurious ideation or behavior indicators observed or expressed   Agreement Not to Harm Self Yes  Description of Agreement verbal  Danger to Others  Danger to Others None reported or observed

## 2023-06-21 NOTE — Progress Notes (Signed)
[facility] MD Progress Note  06/21/2023 2:43 PM Pearly Apachito  MRN:  [id number] Principal Problem: MDD (major depressive disorder) Diagnosis: Principal Problem:   MDD (major depressive disorder)  Reason for admission:  Dannell Gortney is a 58 y.o. African-American male with prior psychiatric diagnoses significant for hopelessness, alcohol use disorder, cocaine use disorder who presents voluntarily to [facility] for complaint of worsening depression resulting in suicidal ideation with plan to overdose with medications or die by "SI by cops" in the context of polysubstance usage.    The patient's chart was reviewed and nursing notes were reviewed. Vitals signs: BP136/75. The patient's case was discussed in multidisciplinary team meeting. Per [facility], patient was taking medications appropriately. The following as needed medications were given:clonidine,  tylenol, atarax, trazodone.  Patient reported waking up with racing thoughts and a racing heart although vitals showed heart rate: 79.   Subjective: The patient was seen resting in his room, no acute distress.  On assessment this a.m. patient reports that he did not sleep well last night.  Patient reports that he woke up with ruminating thoughts about all of his "failures."  Patient reports he discussed with nursing last night how he felt and they recommended patient ask about prazosin.  Patient adamantly denied that he was having any nightmares.  However upon further discussion patient endorsed that he is feeling hypervigilant throughout the day and night and this is related to his 30 years in prison.  Patient reports he is constant looking over his shoulder and was noted to do this multiple times throughout assessment each time the patient walked by his door.  Patient endorsed that this was a survival mechanism while he was in prison and continues to this day.  Patient reports he is looking forward to [facility] but is feeling very anxious about his  health in general.  Patient reported he continued to feel his heart was racing and was not sure where most of his anxiety was suddenly coming from.  Patient denied feeling impulsive.  While patient endorses that he had been very depressed and felt today severe anxiety as his primary symptom.  Patient denies current SI, HI, AVH.   Briefly discussed with patient propranolol versus prazosin.  Ultimately decided on prazosin for patient as it is not required in 3 times daily dosing.  Social Work Update-06/20/2023: Social work reports that the patient has been ambivalent about discharge, stating that he would stay until Monday if he could contact his ex-girlfriend to obtain his aunt's number and have her bring his clothes. He was reportedly able to contact someone to bring his belongings and now expresses a desire to stay and follow through with the plan to discharge to [facility] on Monday.    Total Time spent with patient: 20 minutes  Past Psychiatric History: Previous Psych Diagnoses: Abuse, cocaine induced mood disorder, MDD, tobacco use disorder, GAD, alcohol abuse Prior inpatient treatment: Yes at [facility] H20 23 Current/prior outpatient treatment: Patient denies Prior rehab hx: Yes at [facility] in 2023 Psychotherapy hx: Yes History of suicide: Yes x 1 in 2023, OD with fentanyl History of homicide or aggression: Denies Psychiatric medication history: Patient has been on trial Zoloft, gabapentin, Librium. Psychiatric medication compliance history: Noncompliance due to homelessness Neuromodulation history: Denies Current Psychiatrist: Denies Current therapist: Denies   Past Medical History:  Past Medical History:  Diagnosis Date   Eczema    HLD (hyperlipidemia)    HTN (hypertension)  Type 2 diabetes mellitus (HCC)     Past Surgical History:  Procedure Laterality Date   CERVICAL SPINE SURGERY  2018   right shoulder  1991   Family History:  Family  History  Problem Relation Age of Onset   Diabetes type II Sister    Diabetes type II Paternal Grandmother    Diabetes type II Maternal Aunt    Family Psychiatric  History: See H&P Social History:  Social History   Substance and Sexual Activity  Alcohol Use Yes   Alcohol/week: 200.0 standard drinks of alcohol   Types: 80 Cans of beer, 120 Shots of liquor per week   Comment: reports periodically drinking as much as 4x 40 oz malt liquer and 1x fifth of tequila daily as fund permit     Social History   Substance and Sexual Activity  Drug Use Not Currently   Types: "Crack" cocaine, Methamphetamines, Marijuana, Fentanyl   Comment: Consistantly using crack cocaine almost on a daily basis, cannabis occasionally, other drugs as presented to him by other drug users    Social History   Socioeconomic History   Marital status: Single    Spouse name: Not on file   Number of children: Not on file   Years of education: Not on file   Highest education level: Not on file  Occupational History   Not on file  Tobacco Use   Smoking status: Every Day    Current packs/day: 0.50    Types: Cigarettes   Smokeless tobacco: Never  Substance and Sexual Activity   Alcohol use: Yes    Alcohol/week: 200.0 standard drinks of alcohol    Types: 80 Cans of beer, 120 Shots of liquor per week    Comment: reports periodically drinking as much as 4x 40 oz malt liquer and 1x fifth of tequila daily as fund permit   Drug use: Not Currently    Types: "Crack" cocaine, Methamphetamines, Marijuana, Fentanyl    Comment: Consistantly using crack cocaine almost on a daily basis, cannabis occasionally, other drugs as presented to him by other drug users   Sexual activity: Yes    Partners: Female  Other Topics Concern   Not on file  Social History Narrative   ** Merged History Encounter **       ** Merged History Encounter **       Social Drivers of Corporate investment banker Strain: Low Risk  (03/21/2023)    Received from [facility]   Overall Financial Resource Strain (CARDIA)    Difficulty of Paying Living Expenses: Not very hard  Food Insecurity: Food Insecurity Present (06/15/2023)   Hunger Vital Sign    Worried About Running Out of Food in the Last Year: Often true    Ran Out of Food in the Last Year: Sometimes true  Transportation Needs: Unmet Transportation Needs (06/15/2023)   PRAPARE - Administrator, Civil Service (Medical): Yes    Lack of Transportation (Non-Medical): Yes  Physical Activity: Not on file  Stress: Not on file  Social Connections: Not on file    Current Medications: Current Facility-Administered Medications  Medication Dose Route Frequency Provider Last Rate Last Admin   acetaminophen (TYLENOL) tablet 650 mg  650 mg Oral Q6H PRN Motley-Mangrum, Jadeka A, PMHNP   650 mg at 06/21/23 0810   alum & mag hydroxide-simeth (MAALOX/MYLANTA) 200-200-20 MG/5ML suspension 30 mL  30 mL Oral Q4H PRN Motley-Mangrum, Jadeka A, PMHNP       amLODipine (NORVASC)  tablet 10 mg  10 mg Oral Daily Motley-Mangrum, Jadeka A, PMHNP   10 mg at 06/21/23 0810   haloperidol (HALDOL) tablet 5 mg  5 mg Oral TID PRN Motley-Mangrum, Jadeka A, PMHNP       And   diphenhydrAMINE (BENADRYL) capsule 50 mg  50 mg Oral TID PRN Motley-Mangrum, Jadeka A, PMHNP       haloperidol lactate (HALDOL) injection 5 mg  5 mg Intramuscular TID PRN Motley-Mangrum, Jadeka A, PMHNP       And   diphenhydrAMINE (BENADRYL) injection 50 mg  50 mg Intramuscular TID PRN Motley-Mangrum, Jadeka A, PMHNP       And   LORazepam (ATIVAN) injection 2 mg  2 mg Intramuscular TID PRN Motley-Mangrum, Jadeka A, PMHNP       haloperidol lactate (HALDOL) injection 10 mg  10 mg Intramuscular TID PRN Motley-Mangrum, Jadeka A, PMHNP       And   diphenhydrAMINE (BENADRYL) injection 50 mg  50 mg Intramuscular TID PRN Motley-Mangrum, Jadeka A, PMHNP       And   LORazepam (ATIVAN) injection 2 mg  2 mg Intramuscular TID PRN  Motley-Mangrum, Geralynn Ochs A, PMHNP       folic acid (FOLVITE) tablet 1 mg  1 mg Oral Daily Motley-Mangrum, Jadeka A, PMHNP   1 mg at 06/21/23 0810   hydrOXYzine (ATARAX) tablet 25 mg  25 mg Oral TID PRN Motley-Mangrum, Jadeka A, PMHNP   25 mg at 06/20/23 2115   influenza vac split trivalent PF (FLULAVAL) injection 0.5 mL  0.5 mL Intramuscular Tomorrow-1000 Attiah, Nadir, MD       magnesium hydroxide (MILK OF MAGNESIA) suspension 30 mL  30 mL Oral Daily PRN Motley-Mangrum, Jadeka A, PMHNP       melatonin tablet 3 mg  3 mg Oral QHS PRN Eliseo Gum B, MD       metFORMIN (GLUCOPHAGE) tablet 850 mg  850 mg Oral BID WC Motley-Mangrum, Jadeka A, PMHNP   850 mg at 06/21/23 0810   multivitamin with minerals tablet 1 tablet  1 tablet Oral Daily Motley-Mangrum, Jadeka A, PMHNP   1 tablet at 06/21/23 0810   pneumococcal 20-valent conjugate vaccine (PREVNAR 20) injection 0.5 mL  0.5 mL Intramuscular Tomorrow-1000 Nkwenti, Doris, NP       prazosin (MINIPRESS) capsule 1 mg  1 mg Oral BID Eliseo Gum B, MD   1 mg at 06/21/23 1146   sertraline (ZOLOFT) tablet 50 mg  50 mg Oral Daily Ntuen, Tina C, FNP   50 mg at 06/21/23 0810   thiamine (Vitamin B-1) tablet 100 mg  100 mg Oral Daily Motley-Mangrum, Jadeka A, PMHNP   100 mg at 06/21/23 1610   Or   thiamine (VITAMIN B1) injection 100 mg  100 mg Intravenous Daily Motley-Mangrum, Jadeka A, PMHNP   100 mg at 06/18/23 0809   vitamin D3 (CHOLECALCIFEROL) tablet 1,000 Units  1,000 Units Oral Daily Cecilie Lowers, FNP   1,000 Units at 06/21/23 0810   Lab Results:  No results found for this or any previous visit (from the past 48 hours).   Blood Alcohol level:  Lab Results  Component Value Date   [facility] <10 06/14/2023   ETH <10 11/09/2022    Metabolic Disorder Labs: Lab Results  Component Value Date   HGBA1C 7.4 (H) 02/23/2022   MPG 165.68 02/23/2022   No results found for: "PROLACTIN" Lab Results  Component Value Date   CHOL 140 02/23/2022   TRIG 64  02/23/2022  HDL 42 02/23/2022   CHOLHDL 3.3 02/23/2022   VLDL 13 02/23/2022   LDLCALC 85 02/23/2022   LDLCALC 76 02/22/2022   Physical Findings: AIMS:  , ,N/A  ,  ,    CIWA:  CIWA-Ar Total: 6 COWS:  N/A   Musculoskeletal: Strength & Muscle Tone: within normal limits Gait & Station: normal Patient leans: N/A  Psychiatric Specialty Exam:  Presentation  General Appearance:  Appropriate for Environment; Well Groomed  Eye Contact: Good  Speech: Clear and Coherent  Speech Volume: Normal  Handedness: Right  Mood and Affect  Mood: Anxious  Affect: Congruent  Thought Process  Thought Processes: Coherent  Descriptions of Associations:Intact  Orientation:Full (Time, Place and Person)  Thought Content:Logical  History of Schizophrenia/Schizoaffective disorder:No  Duration of Psychotic Symptoms:N/A  Hallucinations:Hallucinations: None   Ideas of Reference:None  Suicidal Thoughts:Suicidal Thoughts: No   Homicidal Thoughts:Homicidal Thoughts: No   Sensorium  Memory: Immediate Good; Recent Good  Judgment: Good  Insight: Good  Executive Functions  Concentration: Fair  Attention Span: Good  Recall: Good  Fund of Knowledge: Good  Language: Good  Psychomotor Activity  Psychomotor Activity: Psychomotor Activity: Normal   Assets  Assets: Desire for Improvement; Communication Skills; Resilience  Sleep  Sleep: Sleep: Poor Number of Hours of Sleep: 7.75   Physical Exam  General: Pleasant, well-appearing . No acute distress. Pulmonary: Normal effort. No wheezing or rales. Skin: No obvious rash or lesions. Neuro: A&Ox3.No focal deficit.   Review of Systems  No reported symptoms   Blood pressure 136/75, pulse 79, temperature 98.1 F (36.7 C), temperature source Oral, resp. rate 16, height 5\' 7"  (1.702 m), weight 85.5 kg, SpO2 99%. Body mass index is 29.51 kg/m.  Treatment Plan Summary: Daily contact with patient to  assess and evaluate symptoms and progress in treatment and Medication management Treatment Plan Summary: Daily contact with patient to assess and evaluate symptoms and progress in treatment, Medication management.  Physician Treatment Plan for Primary Diagnosis:  Assessment: MDD (major depressive disorder)   Medication: -- Continue Zoloft tablet 50 mg p.o. daily for depression and anxiety --Start prazosin 1 mg twice daily --Continue trazodone tablet 50 mg p.o. daily as needed nightly for insomnia --Continue hydroxyzine tablet 25 mg p.o. 3 times daily as needed for anxiety   CIWA protocol: See MAR   Agitation protocol: Benadryl capsule 50 mg p.o. or IM 3 times daily as needed agitation   Haldol tablets 5 mg po IM 3 times daily as needed agitation   Lorazepam tablet 2 mg p.o. or IM 3 times daily as needed agitation     Other PRN Medications -Acetaminophen 650 mg every 6 as needed/mild pain -Maalox 30 mL oral every 4 as needed/digestion -Magnesium hydroxide 30 mL daily as needed/mild constipation   --The risks/benefits/side-effects/alternatives to this medication were discussed in detail with the patient and time was given for questions. The patient consents to medication trial.  -- Metabolic profile and EKG monitoring obtained while on an atypical antipsychotic (BMI: Lipid Panel: HbgA1c: QTc:)  -- Encouraged patient to participate in unit milieu and in scheduled group therapies    Admission labs reviewed: CMP: CO2 21 low, glucose 171 elevated, BUN 22 elevated, calcium 8.4 low.  Otherwise normal.  CBC with differentials: WBC 12.4 elevated, hemoglobin 10.9 low, HCT 35.6 low, MCH 24.9 low, platelets 429 high, RDW 18.8 elevated, neutrophils 8.8 elevated, monocyte 1.1 elevated, otherwise normal.  UDS: Positive for benzodiazepines, positive for cocaine, positive for marijuana.   New labs ordered:  TSH: 1.833 WNL, vitamin B-12: 507 WNL.  vitamin D 25-hydroxy: 20.60 low.  EKG reviewed: Normal  sinus rhythm, ventricular rate 72, QT/QTc :392/429   Safety and Monitoring: Voluntary admission to inpatient psychiatric unit for safety, stabilization and treatment Daily contact with patient to assess and evaluate symptoms and progress in treatment Patient's case to be discussed in multi-disciplinary team meeting Observation Level : q15 minute checks Vital signs: q12 hours Precautions: suicide, but pt currently verbally contracts for safety on unit    Discharge Planning: Social work and case management to assist with discharge planning and identification of hospital follow-up needs prior to discharge Estimated LOS: 5-7 days Discharge Concerns: Need to establish a safety plan; Medication compliance and effectiveness Discharge Goals: [facility] plan for Monday   Long Term Goal(s): Improvement in symptoms so as ready for discharge   Short Term Goals: Ability to identify changes in lifestyle to reduce recurrence of condition will improve, Ability to verbalize feelings will improve, Ability to disclose and discuss suicidal ideas, Ability to demonstrate self-control will improve, Ability to identify and develop effective coping behaviors will improve, Ability to maintain clinical measurements within normal limits will improve, Compliance with prescribed medications will improve, and Ability to identify triggers associated with substance abuse/mental health issues will improve   Physician Treatment Plan for Secondary Diagnosis: Principal Problem:   MDD (major depressive disorder)  I certify that inpatient services furnished can reasonably be expected to improve the patient's condition.       Bobbye Morton, MD 06/21/2023, 2:43 PM Patient ID: Vena Rua, male   DOB: 21-Sep-1965, 58 y.o.   MRN: [id number]

## 2023-06-21 NOTE — Progress Notes (Signed)
   06/20/23 2130  Psych Admission Type (Psych Patients Only)  Admission Status Voluntary  Psychosocial Assessment  Patient Complaints Depression;Anxiety;Insomnia;Sleep disturbance (Pt report anxiety earlier today, his heart racing and racing thoughts.  Pt requested to try Prazosin/minipress and referred him to talk to treatment team tomorrow. BP was 160/90 and Prn ordered to address (see eMAR).)  Eye Contact Other (Comment) (good)  Facial Expression Pensive  Affect Constricted;Appropriate to circumstance  Speech Logical/coherent  Interaction Assertive  Motor Activity Slow  Appearance/Hygiene Unremarkable;In scrubs  Behavior Characteristics Appropriate to situation;Cooperative  Mood Anxious;Pleasant (Pt reported depression is less since he was able to be in communication with some of his people.  He is social with select peers, calm, pleasant.)  Thought Process  Coherency WDL  Content WDL  Delusions None reported or observed  Perception WDL  Hallucination None reported or observed  Judgment WDL  Confusion WDL  Danger to Self  Current suicidal ideation? Denies  Self-Injurious Behavior No self-injurious ideation or behavior indicators observed or expressed   Agreement Not to Harm Self Yes  Description of Agreement Verbal  Danger to Others  Danger to Others None reported or observed

## 2023-06-21 NOTE — Plan of Care (Signed)
  Problem: Coping: Goal: Ability to verbalize frustrations and anger appropriately will improve Outcome: Progressing Goal: Ability to demonstrate self-control will improve Outcome: Progressing   Problem: Safety: Goal: Periods of time without injury will increase Outcome: Progressing   Problem: Education: Goal: Ability to state activities that reduce stress will improve Outcome: Progressing: pt endorsed communication with his people helped to decrease feelings of depression

## 2023-06-21 NOTE — Plan of Care (Signed)
  Problem: Education: Goal: Knowledge of [facility] Education information/materials will improve Outcome: Progressing Goal: Emotional status will improve Outcome: Progressing Goal: Mental status will improve Outcome: Progressing Goal: Verbalization of understanding the information provided will improve Outcome: Progressing  Patient is compliant with medications and treatment stated he is looking forward to go to [facility] and "leave a sober life" Patient is pleasant interacting well with Peers and Staff visible in Milieu. Q 15 minutes safety checks ongoing Patient remains safe. Denies SI/HI/A/VH and verbally contracts for safety.

## 2023-06-21 NOTE — Group Note (Signed)
Date:  06/21/2023 Time:1400  Group Topic/Focus:  Developing a Wellness Toolbox:   The focus of this group is to help patients develop a "wellness toolbox" with skills and strategies to promote recovery upon discharge.    Participation Level:  Active  Participation Quality:  Appropriate and Attentive  Affect:  Appropriate  Cognitive:  Alert and Appropriate  Insight: Appropriate  Engagement in Group:  Engaged  Modes of Intervention:  Activity, Education, Exploration, Rapport Building, Socialization, and Support  Additional Comments:    Shela Nevin 06/21/2023, 6:23 PM

## 2023-06-22 ENCOUNTER — Encounter (HOSPITAL_COMMUNITY): Payer: Self-pay | Admitting: Psychiatry

## 2023-06-22 MED ORDER — VITAMIN D3 25 MCG PO TABS
1000.0000 [IU] | ORAL_TABLET | Freq: Every day | ORAL | 0 refills | Status: DC
Start: 2023-06-23 — End: 2023-07-07

## 2023-06-22 MED ORDER — SERTRALINE HCL 50 MG PO TABS: 50.0000 mg | ORAL_TABLET | Freq: Every day | ORAL | 0 refills | Status: AC

## 2023-06-22 MED ORDER — HYDROXYZINE HCL 25 MG PO TABS: 25.0000 mg | ORAL_TABLET | Freq: Three times a day (TID) | ORAL | 0 refills | Status: AC | PRN

## 2023-06-22 MED ORDER — DICLOFENAC SODIUM 1 % EX GEL
2.0000 g | Freq: Four times a day (QID) | CUTANEOUS | Status: DC
  Administered 2023-06-22 (×2): 2 g via TOPICAL
  Filled 2023-06-22: qty 100

## 2023-06-22 MED ORDER — ATORVASTATIN CALCIUM 20 MG PO TABS
20.0000 mg | ORAL_TABLET | Freq: Every day | ORAL | Status: DC
  Filled 2023-06-22 (×2): qty 14

## 2023-06-22 MED ORDER — TRAZODONE HCL 100 MG PO TABS: 100.0000 mg | ORAL_TABLET | Freq: Every day | ORAL | 0 refills | Status: AC

## 2023-06-22 MED ORDER — TRAZODONE HCL 100 MG PO TABS
100.0000 mg | ORAL_TABLET | Freq: Every day | ORAL | Status: DC
  Administered 2023-06-22: 100 mg via ORAL
  Filled 2023-06-22: qty 1
  Filled 2023-06-22: qty 14
  Filled 2023-06-22: qty 1

## 2023-06-22 MED ORDER — METFORMIN HCL 850 MG PO TABS: 850.0000 mg | ORAL_TABLET | Freq: Two times a day (BID) | ORAL | 0 refills | Status: DC

## 2023-06-22 NOTE — BHH Group Notes (Signed)
Pt did not participate in group activity

## 2023-06-22 NOTE — Progress Notes (Signed)
  [facility] Adult Case Management Discharge Plan :  Will you be returning to the same living situation after discharge:  Pt will be going to [facility] in [place], [place]  At discharge, do you have transportation home?: CSW Will coordinate transportation Do you have the ability to pay for your medications: Yes,  [employer]  Release of information consent forms completed and in the chart;  Patient's signature needed at discharge.  Patient to Follow up at:  Follow-up Information     [facility] Follow up.   Specialty: Behavioral Health Why: You may go to this provider at 7:00 am for medication management services. You may also go on Monday through Friday, arrive by 7:00 am. Contact information: [address] [place] [address] [phone]        [facility] Follow up.   Specialty: Professional Counselor Why: You may go to this for therapy services. You may also go on Monday through Friday, from 9 am to 1 pm. Contact information: [address] [place] [address] [phone]         [facility] Go on 06/23/2023.   Why: Referral made Contact information: [address] [place] [address] [phone]                 Next level of care provider has access to [facility] Link:no  Safety Planning and Suicide Prevention discussed: Yes,  with patient pamphlet provided with additional crises info     Has patient been referred to the Quitline?: no pt scheduled to go to [facility]  Patient has been referred for addiction treatment: Yes, the patient will follow up with an outpatient provider for substance use disorder. PCP: appointment made  Steffanie Dunn, LCSWA 06/22/2023, 6:07 PM

## 2023-06-22 NOTE — Discharge Summary (Addendum)
Physician Discharge Summary Note  Patient:  Jeffery Wheeler is an 58 y.o., male MRN:  [id number] DOB:  March 31, 1966 Patient phone:  [phone] (home)  Patient address:   [address] [place] [address],  Total Time spent with patient: 20 minutes  Date of Admission:  06/15/2023 Date of Discharge: 06/23/2023  Reason for Admission:  Suicidal ideation with plan to overdose with medications or die by "SI by cops" in the context of polysubstance usage   Principal Problem: MDD (major depressive disorder) Discharge Diagnoses: Principal Problem:   MDD (major depressive disorder)   Past Psychiatric History:  Previous Psych Diagnoses: Abuse, cocaine induced mood disorder, MDD, tobacco use disorder, GAD, alcohol abuse Prior inpatient treatment: Yes at [facility] H20 23 Current/prior outpatient treatment: Patient denies Prior rehab hx: Yes at [facility] in 2023 Psychotherapy hx: Yes History of suicide: Yes x 1 in 2023, OD with fentanyl History of homicide or aggression: Denies Psychiatric medication history: Patient has been on trial Zoloft, gabapentin, Librium. Psychiatric medication compliance history: Noncompliance due to homelessness Neuromodulation history: Denies Current Psychiatrist: Denies Current therapist: Denies   Past Medical History:  Past Medical History:  Diagnosis Date   Eczema    HLD (hyperlipidemia)    HTN (hypertension)    Type 2 diabetes mellitus (HCC)     Past Surgical History:  Procedure Laterality Date   CERVICAL SPINE SURGERY  2018   right shoulder  1991   Family History:  Family History  Problem Relation Age of Onset   Diabetes type II Sister    Diabetes type II Paternal Grandmother    Diabetes type II Maternal Aunt    Family Psychiatric  History: Patient unsure  Social History:  Social History   Substance and Sexual Activity  Alcohol Use Yes   Alcohol/week: 200.0 standard drinks of alcohol   Types: 80 Cans of beer, 120  Shots of liquor per week   Comment: reports periodically drinking as much as 4x 40 oz malt liquer and 1x fifth of tequila daily as fund permit     Social History   Substance and Sexual Activity  Drug Use Not Currently   Types: "Crack" cocaine, Methamphetamines, Marijuana, Fentanyl   Comment: Consistantly using crack cocaine almost on a daily basis, cannabis occasionally, other drugs as presented to him by other drug users    Social History   Socioeconomic History   Marital status: Single    Spouse name: Not on file   Number of children: Not on file   Years of education: Not on file   Highest education level: Not on file  Occupational History   Not on file  Tobacco Use   Smoking status: Every Day    Current packs/day: 0.50    Average packs/day: 0.5 packs/day for 40.0 years (20.0 ttl pk-yrs)    Types: Cigarettes    Start date: 06/22/1983   Smokeless tobacco: Never  Substance and Sexual Activity   Alcohol use: Yes    Alcohol/week: 200.0 standard drinks of alcohol    Types: 80 Cans of beer, 120 Shots of liquor per week    Comment: reports periodically drinking as much as 4x 40 oz malt liquer and 1x fifth of tequila daily as fund permit   Drug use: Not Currently    Types: "Crack" cocaine, Methamphetamines, Marijuana, Fentanyl    Comment: Consistantly using crack cocaine almost on a daily basis, cannabis occasionally, other drugs as presented to him by other drug users  Sexual activity: Yes    Partners: Female  Other Topics Concern   Not on file  Social History Narrative   ** Merged History Encounter **       ** Merged History Encounter **       Social Drivers of Corporate investment banker Strain: Low Risk  (03/21/2023)   Received from [facility]   Overall Financial Resource Strain (CARDIA)    Difficulty of Paying Living Expenses: Not very hard  Food Insecurity: Food Insecurity Present (06/15/2023)   Hunger Vital Sign    Worried About Running Out of Food in the Last  Year: Often true    Ran Out of Food in the Last Year: Sometimes true  Transportation Needs: Unmet Transportation Needs (06/15/2023)   PRAPARE - Administrator, Civil Service (Medical): Yes    Lack of Transportation (Non-Medical): Yes  Physical Activity: Not on file  Stress: Not on file  Social Connections: Not on file    Hospital Course:    On final assessment patient endorsed anxiety about moving on to rehab but was overall determined to start process.  Patient was denying SI, HI and AVH.  Patient was getting along well with others on the unit and had a good appetite and energy level.  During the patient's hospitalization, patient had extensive initial psychiatric evaluation, and follow-up psychiatric evaluations every day.  Psychiatric diagnoses provided upon initial assessment: MDD (major depressive disorder)   Patient's psychiatric medications were adjusted on admission:  -- Continue Zoloft tablet 50 mg p.o. daily for depression and anxiety --Continue trazodone tablet 50 mg p.o. daily as needed nightly for insomnia --Continue hydroxyzine tablet 25 mg p.o. 3 times daily as needed for anxiety    During the hospitalization, other adjustments were made to the patient's psychiatric medication regimen:  - Started Prazosin 1mg  BID, but discontinued prior to discharge  Gradually, patient started adjusting to milieu.   Patient's care was discussed during the interdisciplinary team meeting every day during the hospitalization.  The patient denied having side effects to prescribed psychiatric medication.  Patient did endorse having increased nightmares with prazosin thus discontinuing prior to discharge.  The patient reports their target psychiatric symptoms of dysphoric mood responded well to the psychiatric medications, and the patient reports overall benefit other psychiatric hospitalization. Supportive psychotherapy was provided to the patient. The patient also participated in  regular group therapy while admitted.   Labs were reviewed with the patient, and abnormal results were discussed with the patient.  The patient denied having suicidal thoughts more than 48 hours prior to discharge.  Patient denies having homicidal thoughts.  Patient denies having auditory hallucinations.  Patient denies any visual hallucinations.  Patient denies having paranoid thoughts.  The patient is able to verbalize their individual safety plan to this provider.  It is recommended to the patient to continue psychiatric medications as prescribed, after discharge from the hospital.    It is recommended to the patient to follow up with your outpatient psychiatric provider and PCP.  Discussed with the patient, the impact of alcohol, drugs, tobacco have been there overall psychiatric and medical wellbeing, and total abstinence from substance use was recommended the patient.   Physical Findings: AIMS:  , ,  ,  ,    CIWA:  CIWA-Ar Total: 6 COWS:     Musculoskeletal: Strength & Muscle Tone: within normal limits Gait & Station: normal Patient leans: N/A   Psychiatric Specialty Exam:  Presentation  General Appearance:  Appropriate for Environment  Eye Contact: Good  Speech: Clear and Coherent  Speech Volume: Normal  Handedness: Right   Mood and Affect  Mood: Anxious  Affect: Appropriate   Thought Process  Thought Processes: Coherent  Descriptions of Associations:Intact  Orientation:Full (Time, Place and Person)  Thought Content:Logical  History of Schizophrenia/Schizoaffective disorder:No  Duration of Psychotic Symptoms:N/A  Hallucinations:Hallucinations: None  Ideas of Reference:None  Suicidal Thoughts:Suicidal Thoughts: No  Homicidal Thoughts:Homicidal Thoughts: No   Sensorium  Memory: Immediate Good; Recent Good  Judgment: Fair  Insight: Good   Executive Functions  Concentration: Good  Attention  Span: Good  Recall: Good  Fund of Knowledge: Good  Language: Good   Psychomotor Activity  Psychomotor Activity: Psychomotor Activity: Normal   Assets  Assets: Communication Skills; Desire for Improvement; Resilience   Sleep  Sleep: Sleep: Poor Number of Hours of Sleep: 8.5    Physical Exam: Physical Exam Constitutional:      Appearance: Normal appearance.  HENT:     Head: Normocephalic and atraumatic.  Pulmonary:     Effort: Pulmonary effort is normal.  Neurological:     Mental Status: He is alert and oriented to person, place, and time.    Review of Systems  Psychiatric/Behavioral:  Negative for depression, hallucinations and suicidal ideas.    Blood pressure (!) 148/86, pulse 69, temperature 99.3 F (37.4 C), temperature source Oral, resp. rate 18, height 5\' 7"  (1.702 m), weight 85.5 kg, SpO2 99%. Body mass index is 29.51 kg/m.   Social History   Tobacco Use  Smoking Status Every Day   Current packs/day: 0.50   Average packs/day: 0.5 packs/day for 40.0 years (20.0 ttl pk-yrs)   Types: Cigarettes   Start date: 06/22/1983  Smokeless Tobacco Never   Tobacco Cessation:  N/A, patient does not currently use tobacco products   Blood Alcohol level:  Lab Results  Component Value Date   ETH <10 06/14/2023   ETH <10 11/09/2022    Metabolic Disorder Labs:  Lab Results  Component Value Date   HGBA1C 7.4 (H) 02/23/2022   MPG 165.68 02/23/2022   No results found for: "PROLACTIN" Lab Results  Component Value Date   CHOL 140 02/23/2022   TRIG 64 02/23/2022   HDL 42 02/23/2022   CHOLHDL 3.3 02/23/2022   VLDL 13 02/23/2022   LDLCALC 85 02/23/2022   LDLCALC 76 02/22/2022    See Psychiatric Specialty Exam and Suicide Risk Assessment completed by Attending Physician prior to discharge.  Discharge destination:  [facility]  Is patient on multiple antipsychotic therapies at discharge:  No   Has Patient had three or more failed trials of  antipsychotic monotherapy by history:  No  Recommended Plan for Multiple Antipsychotic Therapies: NA   Allergies as of 06/22/2023       Reactions   Latex Rash, Other (See Comments)   Skin breaks out Pt denies allergy 06/16/23   Shellfish-derived Products Rash        Medication List     STOP taking these medications    chlordiazePOXIDE 25 MG capsule Commonly known as: LIBRIUM   gabapentin 300 MG capsule Commonly known as: NEURONTIN       TAKE these medications      Indication  amLODipine 10 MG tablet Commonly known as: NORVASC Take 1 tablet (10 mg total) by mouth daily.  Indication: High Blood Pressure   atorvastatin 20 MG tablet Commonly known as: Lipitor Take 1 tablet (20 mg total) by mouth daily.  Indication: High  Amount of Fats in the Blood   hydrOXYzine 25 MG tablet Commonly known as: ATARAX Take 1 tablet (25 mg total) by mouth 3 (three) times daily as needed for anxiety.  Indication: Feeling Anxious   metFORMIN 850 MG tablet Commonly known as: GLUCOPHAGE Take 1 tablet (850 mg total) by mouth 2 (two) times daily with a meal. What changed: Another medication with the same name was removed. Continue taking this medication, and follow the directions you see here.  Indication: Type 2 Diabetes   sertraline 50 MG tablet Commonly known as: Zoloft Take 1 tablet (50 mg total) by mouth daily.  Indication: Major Depressive Disorder   traZODone 100 MG tablet Commonly known as: DESYREL Take 1 tablet (100 mg total) by mouth at bedtime.  Indication: Trouble Sleeping   vitamin D3 25 MCG tablet Commonly known as: CHOLECALCIFEROL Take 1 tablet (1,000 Units total) by mouth daily. Start taking on: June 23, 2023  Indication: Vitamin D Deficiency        Follow-up Information     [facility] Follow up.   Specialty: Behavioral Health Why: You may go to this provider at 7:00 am for medication management services. You may also go  on Monday through Friday, arrive by 7:00 am. Contact information: [address] [place] [address] [phone]        [facility] Follow up.   Specialty: Professional Counselor Why: You may go to this for therapy services. You may also go on Monday through Friday, from 9 am to 1 pm. Contact information: [address] [place] [address] [phone]         [facility]. Go on 06/23/2023.   Why: Referral made Contact information: [address] [place] [address] [phone]                 Follow-up recommendations:  Activity: as tolerated  Diet: heart healthy  Other: -Follow-up with your outpatient psychiatric provider -instructions on appointment date, time, and address (location) are provided to you in discharge paperwork.  -Take your psychiatric medications as prescribed at discharge - instructions are provided to you in the discharge paperwork  -Follow-up with outpatient primary care doctor and other specialists -for management of preventative medicine and chronic medical disease: HTN and chronic pain  -Testing: Follow-up with outpatient provider for abnormal lab results: UA w/ glucosuria and Vit D,  CMP  -If you are prescribed an atypical antipsychotic medication, we recommend that your outpatient psychiatrist follow routine screening for side effects within 3 months of discharge, including monitoring: AIMS scale, height, weight, blood pressure, fasting lipid panel, HbA1c, and fasting blood sugar.   -Recommend total abstinence from alcohol, tobacco, and other illicit drug use at discharge.   -If your psychiatric symptoms recur, worsen, or if you have side effects to your psychiatric medications, call your outpatient psychiatric provider, 911, [phone] or go to the nearest emergency department.  -If suicidal thoughts occur, immediately call your outpatient psychiatric provider, 911, [phone] or go  to the nearest emergency department.  Signed: Bobbye Morton, MD 06/22/2023, 3:21 PM

## 2023-06-22 NOTE — Plan of Care (Signed)

## 2023-06-22 NOTE — BHH Group Notes (Signed)
Pt did not attend goals group. 

## 2023-06-22 NOTE — [facility]HH Suicide Risk Assessment (Signed)
[facility] Discharge Suicide Risk Assessment   Principal Problem: MDD (major depressive disorder) Discharge Diagnoses: Principal Problem:   MDD (major depressive disorder)  During the patient's hospitalization, patient had extensive initial psychiatric evaluation, and follow-up psychiatric evaluations every day.   Psychiatric diagnoses provided upon initial assessment: MDD (major depressive disorder)    Patient's psychiatric medications were adjusted on admission:  -- Continue Zoloft tablet 50 mg p.o. daily for depression and anxiety --Continue trazodone tablet 50 mg p.o. daily as needed nightly for insomnia --Continue hydroxyzine tablet 25 mg p.o. 3 times daily as needed for anxiety     During the hospitalization, other adjustments were made to the patient's psychiatric medication regimen:  - Started Prazosin 1mg  BID, but discontinued prior to discharge   Gradually, patient started adjusting to milieu.   Patient's care was discussed during the interdisciplinary team meeting every day during the hospitalization.   The patient denied having side effects to prescribed psychiatric medication.  Patient did endorse having increased nightmares with prazosin thus discontinuing prior to discharge.   The patient reports their target psychiatric symptoms of dysphoric mood responded well to the psychiatric medications, and the patient reports overall benefit other psychiatric hospitalization. Supportive psychotherapy was provided to the patient. The patient also participated in regular group therapy while admitted.    Labs were reviewed with the patient, and abnormal results were discussed with the patient.   The patient denied having suicidal thoughts more than 48 hours prior to discharge.  Patient denies having homicidal thoughts.  Patient denies having auditory hallucinations.  Patient denies any visual hallucinations.  Patient denies having paranoid thoughts.   The patient is able to verbalize their  individual safety plan to this provider.   It is recommended to the patient to continue psychiatric medications as prescribed, after discharge from the hospital.     It is recommended to the patient to follow up with your outpatient psychiatric provider and PCP.   Discussed with the patient, the impact of alcohol, drugs, tobacco have been there overall psychiatric and medical wellbeing, and total abstinence from substance use was recommended the patient.    Total Time spent with patient: 20 minutes  Musculoskeletal: Strength & Muscle Tone: within normal limits Gait & Station: normal Patient leans: N/A  Psychiatric Specialty Exam  Presentation  General Appearance:  Appropriate for Environment  Eye Contact: Good  Speech: Clear and Coherent  Speech Volume: Normal  Handedness: Right   Mood and Affect  Mood: Anxious  Duration of Depression Symptoms: Less than two weeks  Affect: Appropriate   Thought Process  Thought Processes: Coherent  Descriptions of Associations:Intact  Orientation:Full (Time, Place and Person)  Thought Content:Logical  History of Schizophrenia/Schizoaffective disorder:No  Duration of Psychotic Symptoms:N/A  Hallucinations:Hallucinations: None  Ideas of Reference:None  Suicidal Thoughts:Suicidal Thoughts: No  Homicidal Thoughts:Homicidal Thoughts: No   Sensorium  Memory: Immediate Good; Recent Good  Judgment: Fair  Insight: Good   Executive Functions  Concentration: Good  Attention Span: Good  Recall: Good  Fund of Knowledge: Good  Language: Good   Psychomotor Activity  Psychomotor Activity: Psychomotor Activity: Normal   Assets  Assets: Communication Skills; Desire for Improvement; Resilience   Sleep  Sleep: Sleep: Poor Number of Hours of Sleep: 8.5   Physical Exam: Physical Exam Constitutional:      Appearance: Normal appearance.  HENT:     Head: Normocephalic and atraumatic.   Pulmonary:     Effort: Pulmonary effort is normal.  Neurological:  Mental Status: He is alert and oriented to person, place, and time.    Review of Systems  Psychiatric/Behavioral:  Negative for depression, hallucinations and suicidal ideas. The patient has insomnia.    Blood pressure (!) 148/86, pulse 69, temperature 99.3 F (37.4 C), temperature source Oral, resp. rate 18, height 5\' 7"  (1.702 m), weight 85.5 kg, SpO2 99%. Body mass index is 29.51 kg/m.  Mental Status Per Nursing Assessment::   On Admission:  Suicide plan  Demographic Factors:  Male  Loss Factors: NA  Historical Factors: NA  Risk Reduction Factors:   Positive social support  Continued Clinical Symptoms:  Alcohol/Substance Abuse/Dependencies  Cognitive Features That Contribute To Risk:  None    Suicide Risk:  Mild:  Suicidal ideation of limited frequency, intensity, duration, and specificity.  There are no identifiable plans, no associated intent, mild dysphoria and related symptoms, good self-control (both objective and subjective assessment), few other risk factors, and identifiable protective factors, including available and accessible social support.   Follow-up Information     [facility] Follow up.   Specialty: Behavioral Health Why: You may go to this provider at 7:00 am for medication management services. You may also go on Monday through Friday, arrive by 7:00 am. Contact information: [address] [place] [address] [phone]        [facility] Follow up.   Specialty: Professional Counselor Why: You may go to this for therapy services. You may also go on Monday through Friday, from 9 am to 1 pm. Contact information: [address] [place] [address] [phone]         [facility]. Go on 06/23/2023.   Why: Referral made Contact information: [address] [place]  [address] [phone]                 Plan Of Care/Follow-up recommendations:  Follow-up recommendations:  Activity: as tolerated   Diet: heart healthy   Other: -Follow-up with your outpatient psychiatric provider -instructions on appointment date, time, and address (location) are provided to you in discharge paperwork.   -Take your psychiatric medications as prescribed at discharge - instructions are provided to you in the discharge paperwork   -Follow-up with outpatient primary care doctor and other specialists -for management of preventative medicine and chronic medical disease: HTN and chronic pain   -Testing: Follow-up with outpatient provider for abnormal lab results: UA w/ glucosuria and Vit D,  CMP   -If you are prescribed an atypical antipsychotic medication, we recommend that your outpatient psychiatrist follow routine screening for side effects within 3 months of discharge, including monitoring: AIMS scale, height, weight, blood pressure, fasting lipid panel, HbA1c, and fasting blood sugar.    -Recommend total abstinence from alcohol, tobacco, and other illicit drug use at discharge.    -If your psychiatric symptoms recur, worsen, or if you have side effects to your psychiatric medications, call your outpatient psychiatric provider, 911, [phone] or go to the nearest emergency department.   -If suicidal thoughts occur, immediately call your outpatient psychiatric provider, 911, 988 or go to the nearest emergency department  Bobbye Morton, MD 06/22/2023, 3:27 PM

## 2023-06-22 NOTE — Progress Notes (Signed)
[facility] MD Progress Note  06/22/2023 3:19 PM [name]  MRN:  [id number] Principal Problem: MDD (major depressive disorder) Diagnosis: Principal Problem:   MDD (major depressive disorder)  Reason for admission:  Jeffery Wheeler is a 58 y.o. African-American male with prior psychiatric diagnoses significant for hopelessness, alcohol use disorder, cocaine use disorder who presents voluntarily to [facility] for complaint of worsening depression resulting in suicidal ideation with plan to overdose with medications or die by "SI by cops" in the context of polysubstance usage.    The patient's chart was reviewed and nursing notes were reviewed. Vitals signs: BP153/86.  No behavior concerns.  PRNs: Tylenol, hydroxyzine, and melatonin.    Subjective:  On assessment this a.m. patient reports that Jeffery did not sleep well last night and does not wish to continue taking prazosin.  Patient reports Jeffery had 3 nightmares and therefore did not sleep well.  Patient reports that Jeffery is a bit irritable because Jeffery did not rest well.  Patient and provider discussed and Jeffery is interested in discontinuing the prazosin and going back to trazodone at increased dose.  Patient endorsed that Jeffery was at least able to fall asleep well on trazodone.  Patient reports that otherwise Jeffery is still looking forward to going to rehab and is a bit anxious about moving on to the next place.  Patient reports that his appetite is good and denies SI, HI and AVH.     Total Time spent with patient: 20 minutes  Past Psychiatric History: Previous Psych Diagnoses: Abuse, cocaine induced mood disorder, MDD, tobacco use disorder, GAD, alcohol abuse Prior inpatient treatment: Yes at [facility] H20 23 Current/prior outpatient treatment: Patient denies Prior rehab hx: Yes at [facility] in 2023 Psychotherapy hx: Yes History of suicide: Yes x 1 in 2023, OD with fentanyl History of homicide or aggression:  Denies Psychiatric medication history: Patient has been on trial Zoloft, gabapentin, Librium. Psychiatric medication compliance history: Noncompliance due to homelessness Neuromodulation history: Denies Current Psychiatrist: Denies Current therapist: Denies   Past Medical History:  Past Medical History:  Diagnosis Date   Eczema    HLD (hyperlipidemia)    HTN (hypertension)    Type 2 diabetes mellitus (HCC)     Past Surgical History:  Procedure Laterality Date   CERVICAL SPINE SURGERY  2018   right shoulder  1991   Family History:  Family History  Problem Relation Age of Onset   Diabetes type II Sister    Diabetes type II Paternal Grandmother    Diabetes type II Maternal Aunt    Family Psychiatric  History: See H&P Social History:  Social History   Substance and Sexual Activity  Alcohol Use Yes   Alcohol/week: 200.0 standard drinks of alcohol   Types: 80 Cans of beer, 120 Shots of liquor per week   Comment: reports periodically drinking as much as 4x 40 oz malt liquer and 1x fifth of tequila daily as fund permit     Social History   Substance and Sexual Activity  Drug Use Not Currently   Types: "Crack" cocaine, Methamphetamines, Marijuana, Fentanyl   Comment: Consistantly using crack cocaine almost on a daily basis, cannabis occasionally, other drugs as presented to him by other drug users    Social History   Socioeconomic History   Marital status: Single    Spouse name: Not on file   Number of children: Not on file   Years of education: Not on  file   Highest education level: Not on file  Occupational History   Not on file  Tobacco Use   Smoking status: Every Day    Current packs/day: 0.50    Average packs/day: 0.5 packs/day for 40.0 years (20.0 ttl pk-yrs)    Types: Cigarettes    Start date: 06/22/1983   Smokeless tobacco: Never  Substance and Sexual Activity   Alcohol use: Yes    Alcohol/week: 200.0 standard drinks of alcohol    Types: 80 Cans of beer,  120 Shots of liquor per week    Comment: reports periodically drinking as much as 4x 40 oz malt liquer and 1x fifth of tequila daily as fund permit   Drug use: Not Currently    Types: "Crack" cocaine, Methamphetamines, Marijuana, Fentanyl    Comment: Consistantly using crack cocaine almost on a daily basis, cannabis occasionally, other drugs as presented to him by other drug users   Sexual activity: Yes    Partners: Female  Other Topics Concern   Not on file  Social History Narrative   ** Merged History Encounter **       ** Merged History Encounter **       Social Drivers of Corporate investment banker Strain: Low Risk  (03/21/2023)   Received from [facility]   Overall Financial Resource Strain (CARDIA)    Difficulty of Paying Living Expenses: Not very hard  Food Insecurity: Food Insecurity Present (06/15/2023)   Hunger Vital Sign    Worried About Running Out of Food in the Last Year: Often true    Ran Out of Food in the Last Year: Sometimes true  Transportation Needs: Unmet Transportation Needs (06/15/2023)   PRAPARE - Administrator, Civil Service (Medical): Yes    Lack of Transportation (Non-Medical): Yes  Physical Activity: Not on file  Stress: Not on file  Social Connections: Not on file    Current Medications: Current Facility-Administered Medications  Medication Dose Route Frequency Provider Last Rate Last Admin   acetaminophen (TYLENOL) tablet 650 mg  650 mg Oral Q6H PRN Motley-Mangrum, Jadeka A, PMHNP   650 mg at 06/22/23 0934   alum & mag hydroxide-simeth (MAALOX/MYLANTA) 200-200-20 MG/5ML suspension 30 mL  30 mL Oral Q4H PRN Motley-Mangrum, Jadeka A, PMHNP       amLODipine (NORVASC) tablet 10 mg  10 mg Oral Daily Motley-Mangrum, Jadeka A, PMHNP   10 mg at 06/22/23 0935   diclofenac Sodium (VOLTAREN) 1 % topical gel 2 g  2 g Topical QID Eliseo Gum B, MD       haloperidol (HALDOL) tablet 5 mg  5 mg Oral TID PRN Motley-Mangrum, Jadeka A, PMHNP       And    diphenhydrAMINE (BENADRYL) capsule 50 mg  50 mg Oral TID PRN Motley-Mangrum, Jadeka A, PMHNP       haloperidol lactate (HALDOL) injection 5 mg  5 mg Intramuscular TID PRN Motley-Mangrum, Jadeka A, PMHNP       And   diphenhydrAMINE (BENADRYL) injection 50 mg  50 mg Intramuscular TID PRN Motley-Mangrum, Jadeka A, PMHNP       And   LORazepam (ATIVAN) injection 2 mg  2 mg Intramuscular TID PRN Motley-Mangrum, Jadeka A, PMHNP       haloperidol lactate (HALDOL) injection 10 mg  10 mg Intramuscular TID PRN Motley-Mangrum, Jadeka A, PMHNP       And   diphenhydrAMINE (BENADRYL) injection 50 mg  50 mg Intramuscular TID PRN Motley-Mangrum, Jadeka A,  PMHNP       And   LORazepam (ATIVAN) injection 2 mg  2 mg Intramuscular TID PRN Motley-Mangrum, Geralynn Ochs A, PMHNP       folic acid (FOLVITE) tablet 1 mg  1 mg Oral Daily Motley-Mangrum, Jadeka A, PMHNP   1 mg at 06/22/23 0937   hydrOXYzine (ATARAX) tablet 25 mg  25 mg Oral TID PRN Motley-Mangrum, Jadeka A, PMHNP   25 mg at 06/21/23 2125   influenza vac split trivalent PF (FLULAVAL) injection 0.5 mL  0.5 mL Intramuscular Tomorrow-1000 Attiah, Nadir, MD       magnesium hydroxide (MILK OF MAGNESIA) suspension 30 mL  30 mL Oral Daily PRN Motley-Mangrum, Jadeka A, PMHNP       melatonin tablet 3 mg  3 mg Oral QHS PRN Eliseo Gum B, MD   3 mg at 06/21/23 2125   metFORMIN (GLUCOPHAGE) tablet 850 mg  850 mg Oral BID WC Motley-Mangrum, Jadeka A, PMHNP   850 mg at 06/22/23 0934   multivitamin with minerals tablet 1 tablet  1 tablet Oral Daily Motley-Mangrum, Jadeka A, PMHNP   1 tablet at 06/22/23 0934   pneumococcal 20-valent conjugate vaccine (PREVNAR 20) injection 0.5 mL  0.5 mL Intramuscular Tomorrow-1000 Nkwenti, Doris, NP       sertraline (ZOLOFT) tablet 50 mg  50 mg Oral Daily Ntuen, Tina C, FNP   50 mg at 06/22/23 0934   thiamine (Vitamin B-1) tablet 100 mg  100 mg Oral Daily Motley-Mangrum, Jadeka A, PMHNP   100 mg at 06/22/23 6962   Or   thiamine (VITAMIN B1)  injection 100 mg  100 mg Intravenous Daily Motley-Mangrum, Jadeka A, PMHNP   100 mg at 06/18/23 0809   traZODone (DESYREL) tablet 100 mg  100 mg Oral QHS Eliseo Gum B, MD       vitamin D3 (CHOLECALCIFEROL) tablet 1,000 Units  1,000 Units Oral Daily Ntuen, Jesusita Oka, FNP   1,000 Units at 06/22/23 9528   Lab Results:  No results found for this or any previous visit (from the past 48 hours).   Blood Alcohol level:  Lab Results  Component Value Date   ETH <10 06/14/2023   ETH <10 11/09/2022    Metabolic Disorder Labs: Lab Results  Component Value Date   HGBA1C 7.4 (H) 02/23/2022   MPG 165.68 02/23/2022   No results found for: "PROLACTIN" Lab Results  Component Value Date   CHOL 140 02/23/2022   TRIG 64 02/23/2022   HDL 42 02/23/2022   CHOLHDL 3.3 02/23/2022   VLDL 13 02/23/2022   LDLCALC 85 02/23/2022   LDLCALC 76 02/22/2022   Physical Findings: AIMS:  , ,N/A  ,  ,    CIWA:  CIWA-Ar Total: 6 COWS:  N/A   Musculoskeletal: Strength & Muscle Tone: within normal limits Gait & Station: normal Patient leans: N/A  Psychiatric Specialty Exam:  Presentation  General Appearance:  Appropriate for Environment  Eye Contact: Good  Speech: Clear and Coherent  Speech Volume: Normal  Handedness: Right  Mood and Affect  Mood: Anxious  Affect: Appropriate  Thought Process  Thought Processes: Coherent  Descriptions of Associations:Intact  Orientation:Full (Time, Place and Person)  Thought Content:Logical  History of Schizophrenia/Schizoaffective disorder:No  Duration of Psychotic Symptoms:N/A  Hallucinations:Hallucinations: None   Ideas of Reference:None  Suicidal Thoughts:Suicidal Thoughts: No   Homicidal Thoughts:Homicidal Thoughts: No   Sensorium  Memory: Immediate Good; Recent Good  Judgment: Fair  Insight: Good  Executive Functions  Concentration: Good  Attention  Span: Good  Recall: Good  Fund of  Knowledge: Good  Language: Good  Psychomotor Activity  Psychomotor Activity: Psychomotor Activity: Normal   Assets  Assets: Communication Skills; Desire for Improvement; Resilience  Sleep  Sleep: Sleep: Poor Number of Hours of Sleep: 8.5   Physical Exam  General: Pleasant, well-appearing . No acute distress. Pulmonary: Normal effort. No wheezing or rales. Skin: No obvious rash or lesions. Neuro: A&Ox3.No focal deficit.   Review of Systems  No reported symptoms   Blood pressure (!) 148/86, pulse 69, temperature 99.3 F (37.4 C), temperature source Oral, resp. rate 18, height 5\' 7"  (1.702 m), weight 85.5 kg, SpO2 99%. Body mass index is 29.51 kg/m.  Treatment Plan Summary: Daily contact with patient to assess and evaluate symptoms and progress in treatment and Medication management   Physician Treatment Plan for Primary Diagnosis:  Assessment: MDD (major depressive disorder)   Medication: -- Continue Zoloft tablet 50 mg p.o. daily for depression and anxiety -- Discontinue prazosin 1 mg twice daily --trazodone tablet 100 daily mg p.o. daily as needed nightly for insomnia --Continue hydroxyzine tablet 25 mg p.o. 3 times daily as needed for anxiety   CIWA protocol: See MAR   Agitation protocol: Benadryl capsule 50 mg p.o. or IM 3 times daily as needed agitation   Haldol tablets 5 mg po IM 3 times daily as needed agitation   Lorazepam tablet 2 mg p.o. or IM 3 times daily as needed agitation     Other PRN Medications -Acetaminophen 650 mg every 6 as needed/mild pain -Maalox 30 mL oral every 4 as needed/digestion -Magnesium hydroxide 30 mL daily as needed/mild constipation   --The risks/benefits/side-effects/alternatives to this medication were discussed in detail with the patient and time was given for questions. The patient consents to medication trial.  -- Metabolic profile and EKG monitoring obtained while on an atypical antipsychotic (BMI: Lipid Panel:  HbgA1c: QTc:)  -- Encouraged patient to participate in unit milieu and in scheduled group therapies    Admission labs reviewed: CMP: CO2 21 low, glucose 171 elevated, BUN 22 elevated, calcium 8.4 low.  Otherwise normal.  CBC with differentials: WBC 12.4 elevated, hemoglobin 10.9 low, HCT 35.6 low, MCH 24.9 low, platelets 429 high, RDW 18.8 elevated, neutrophils 8.8 elevated, monocyte 1.1 elevated, otherwise normal.  UDS: Positive for benzodiazepines, positive for cocaine, positive for marijuana.   New labs ordered: TSH: 1.833 WNL, vitamin B-12: 507 WNL.  vitamin D 25-hydroxy: 20.60 low.  EKG reviewed: Normal sinus rhythm, ventricular rate 72, QT/QTc :392/429   Safety and Monitoring: Voluntary admission to inpatient psychiatric unit for safety, stabilization and treatment Daily contact with patient to assess and evaluate symptoms and progress in treatment Patient's case to be discussed in multi-disciplinary team meeting Observation Level : q15 minute checks Vital signs: q12 hours Precautions: suicide, but pt currently verbally contracts for safety on unit    Discharge Planning: Social work and case management to assist with discharge planning and identification of hospital follow-up needs prior to discharge Estimated LOS: 5-7 days Discharge Concerns: Need to establish a safety plan; Medication compliance and effectiveness Discharge Goals: [employer] plan for Monday   Long Term Goal(s): Improvement in symptoms so as ready for discharge   Short Term Goals: Ability to identify changes in lifestyle to reduce recurrence of condition will improve, Ability to verbalize feelings will improve, Ability to disclose and discuss suicidal ideas, Ability to demonstrate self-control will improve, Ability to identify and develop effective coping behaviors will improve, Ability  to maintain clinical measurements within normal limits will improve, Compliance with prescribed medications will improve, and Ability to  identify triggers associated with substance abuse/mental health issues will improve   Physician Treatment Plan for Secondary Diagnosis: Principal Problem:   MDD (major depressive disorder)  I certify that inpatient services furnished can reasonably be expected to improve the patient's condition.       Jeffery Morton, MD 06/22/2023, 3:19 PM Patient ID: Jeffery Wheeler, male   DOB: [birth year], 58 y.o.   MRN: [id number]

## 2023-06-22 NOTE — Progress Notes (Signed)
   06/22/23 0830  Charting Type  Charting Type Shift assessment  Safety Check Verification  Has the RN verified the 15 minute safety check completion? Yes  Neurological  Neuro (WDL) WDL  HEENT  HEENT (WDL) WDL  Respiratory  Respiratory (WDL) WDL  Cardiac  Cardiac (WDL) X (HTN)  Vascular  Vascular (WDL) WDL  Integumentary  Integumentary (WDL) WDL  Braden Scale (Ages 8 and up)  Sensory Perceptions 4  Moisture 4  Activity 4  Mobility 4  Nutrition 3  Friction and Shear 3  Braden Scale Score 22  Musculoskeletal  Musculoskeletal (WDL) X (r. knee pain)  Gastrointestinal  Gastrointestinal (WDL) WDL  GU Assessment  Genitourinary (WDL) WDL  Neurological  Level of Consciousness Alert

## 2023-06-22 NOTE — [facility] Suicide Risk Assessment (Signed)
[facility] INPATIENT:  Family/Significant Other Suicide Prevention Education  Suicide Prevention Education:  .Suicide Prevention Education was reviewed thoroughly with patient, including risk factors, warning signs, and what to do.  Mobile Crisis services were described and that telephone number pointed out, with encouragement to patient to put this number in personal cell phone.  Brochure was provided to patient to share with natural supports.  Patient acknowledged the ways in which they are at risk, and how working through each of their issues can gradually start to reduce their risk factors.  Patient was encouraged to think of the information in the context of people in their own lives.  Patient denied having access to firearms  Patient verbalized understanding of information provided.  Patient endorsed a desire to live.   Steffanie Dunn LCSWA  06/22/23 @1815 

## 2023-06-23 NOTE — Progress Notes (Signed)
Vena Rua  D/C'd Home per MD order.  Discussed with the patient and all questions fully answered.  An After Visit Summary was printed and given to the patient. Patient received prescription.  D/c education completed with patient including follow up instructions, medication list, d/c activities limitations if indicated, with other d/c instructions as indicated by MD - patient able to verbalize understanding, all questions fully answered. Patient denied SI/HI/AVH at the time of discharge, all his belonging in locker 40 given to him.   Patient instructed to return to ED, call [phone], or call his MD for any changes in condition.   Patient escorted to the main entrance, and D/C home via taxi.  Melvenia Needles 06/23/2023 8:27 AM

## 2023-06-23 NOTE — Plan of Care (Signed)
  Problem: Education: Goal: Knowledge of [facility] Education information/materials will improve Outcome: Adequate for Discharge Goal: Emotional status will improve Outcome: Adequate for Discharge Goal: Mental status will improve Outcome: Adequate for Discharge Goal: Verbalization of understanding the information provided will improve Outcome: Adequate for Discharge   Problem: Activity: Goal: Interest or engagement in activities will improve Outcome: Adequate for Discharge Goal: Sleeping patterns will improve Outcome: Adequate for Discharge   Problem: Coping: Goal: Ability to verbalize frustrations and anger appropriately will improve Outcome: Adequate for Discharge Goal: Ability to demonstrate self-control will improve Outcome: Adequate for Discharge   Problem: Health Behavior/Discharge Planning: Goal: Identification of resources available to assist in meeting health care needs will improve Outcome: Adequate for Discharge Goal: Compliance with treatment plan for underlying cause of condition will improve Outcome: Adequate for Discharge   Problem: Physical Regulation: Goal: Ability to maintain clinical measurements within normal limits will improve Outcome: Adequate for Discharge   Problem: Safety: Goal: Periods of time without injury will increase Outcome: Adequate for Discharge   Problem: Education: Goal: Ability to state activities that reduce stress will improve Outcome: Adequate for Discharge

## 2023-06-23 NOTE — [facility] Group Notes (Signed)
Adult Psychoeducational Group Note  Date:   Time:    Group Topic/Focus:  Wrap-Up Group:   The focus of this group is to help patients review their daily goal of treatment and discuss progress on daily workbooks.  Participation Level:    Participation Quality:    Affect:    Cognitive:    Insight:   Engagement in Group:    Modes of Intervention:    Additional Comments:  Ovadia said the group was for children and he will never perticapate  Charna Busman Long 06/23/2023, 4:39 AM

## 2023-06-23 NOTE — Progress Notes (Signed)
   06/22/23 2130  Psych Admission Type (Psych Patients Only)  Admission Status Voluntary  Psychosocial Assessment  Patient Complaints Sleep disturbance  Eye Contact Fair  Facial Expression Pensive (appropriate)  Affect Constricted  Speech Logical/coherent  Interaction Assertive  Motor Activity Slow  Appearance/Hygiene Unremarkable  Behavior Characteristics Cooperative  Mood Euthymic  Thought Process  Coherency WDL  Content WDL  Delusions None reported or observed  Perception WDL  Hallucination None reported or observed  Judgment Impaired  Confusion None  Danger to Self  Current suicidal ideation? Denies  Self-Injurious Behavior No self-injurious ideation or behavior indicators observed or expressed   Agreement Not to Harm Self Yes  Description of Agreement Verbal  Danger to Others  Danger to Others None reported or observed

## 2023-07-07 ENCOUNTER — Ambulatory Visit: Payer: MEDICAID | Admitting: Physician Assistant

## 2023-07-07 ENCOUNTER — Encounter: Payer: Self-pay | Admitting: Physician Assistant

## 2023-07-07 VITALS — BP 144/87 | HR 70 | Ht 67.0 in | Wt 199.0 lb

## 2023-07-07 DIAGNOSIS — E785 Hyperlipidemia, unspecified: Secondary | ICD-10-CM | POA: Diagnosis not present

## 2023-07-07 DIAGNOSIS — E559 Vitamin D deficiency, unspecified: Secondary | ICD-10-CM

## 2023-07-07 DIAGNOSIS — F3341 Major depressive disorder, recurrent, in partial remission: Secondary | ICD-10-CM

## 2023-07-07 DIAGNOSIS — I10 Essential (primary) hypertension: Secondary | ICD-10-CM | POA: Diagnosis not present

## 2023-07-07 DIAGNOSIS — F1010 Alcohol abuse, uncomplicated: Secondary | ICD-10-CM

## 2023-07-07 DIAGNOSIS — F1420 Cocaine dependence, uncomplicated: Secondary | ICD-10-CM

## 2023-07-07 DIAGNOSIS — F17210 Nicotine dependence, cigarettes, uncomplicated: Secondary | ICD-10-CM

## 2023-07-07 DIAGNOSIS — E119 Type 2 diabetes mellitus without complications: Secondary | ICD-10-CM

## 2023-07-07 DIAGNOSIS — M722 Plantar fascial fibromatosis: Secondary | ICD-10-CM

## 2023-07-07 MED ORDER — METFORMIN HCL 850 MG PO TABS: 850.0000 mg | ORAL_TABLET | Freq: Two times a day (BID) | ORAL | 1 refills | Status: DC

## 2023-07-07 MED ORDER — VITAMIN D3 25 MCG PO TABS: 1000.0000 [IU] | ORAL_TABLET | Freq: Every day | ORAL | 1 refills | Status: DC

## 2023-07-07 MED ORDER — AMLODIPINE BESYLATE 10 MG PO TABS: 10.0000 mg | ORAL_TABLET | Freq: Every day | ORAL | 1 refills | Status: DC

## 2023-07-07 MED ORDER — ATORVASTATIN CALCIUM 20 MG PO TABS: 20.0000 mg | ORAL_TABLET | Freq: Every day | ORAL | 1 refills | Status: DC

## 2023-07-07 NOTE — Progress Notes (Unsigned)
 Established Patient Office Visit  Subjective   Patient ID: Jeffery Wheeler, male    DOB: 06-Apr-1966  Age: 58 y.o. MRN: [id number]  Chief Complaint  Patient presents with   Medication Refill   Foot Injury    Right heel, Pain has been consistent for a few months,but has  been hurting more recently    Discussed the use of AI scribe software for clinical note transcription with the patient, who gave verbal consent to proceed.  History of Present Illness        History of Present Illness The patient, with a history of hypertension and arthritis, is currently at [facility] and plans to continue his recovery process. He is unsure of his transition plan but intends to continue his recovery and not return home. He is scheduled to leave the center next Friday.  The patient reports that his blood pressure has been high and not decreasing, although it is not being regularly monitored at the center. His last recorded blood pressure reading was on the upper end of normal. He is currently on 10mg  of amlodipine for hypertension.  The patient has been experiencing foot pain, specifically in the right heel, for the last couple of months. The pain is worse upon waking up in the morning or during the night. He has tried applying arthritis cream to the area, but it has not provided relief. The patient also has arthritis in his hands and lower back. He has been wearing bedroom shoes, which lack cushioning, and despite switching to boots with better soles, the pain persists.  Additionally, the patient has a blister on his tongue, which he attributes to eating too much candy, specifically Coca-Cola. He also consumes a lot of orange juice.  Physical Exam VITALS: BP- 132/68  Results LABS  Blood Pressure: 132/68 mmHg (06/15/2023)  Assessment and Plan Hypertension Elevated blood pressure today, but was normal on February 9th. Patient reports that blood pressure is not being regularly  monitored at the recovery center. Currently on Amlodipine 10mg  daily. -Advise the recovery center to monitor blood pressure daily. -Plan for a virtual visit to reassess blood pressure and adjust medication if necessary before patient's discharge.  Plantar Fasciitis Patient reports right heel pain, worse in the morning and after periods of rest. Pain is not relieved by arthritis cream. -Advise patient to wear supportive shoes, stretch calf muscles, and perform toe exercises. -Recommend use of ibuprofen for pain relief. -Consider referral to a foot specialist if symptoms do not improve with conservative management.  Oral Irritation Patient reports oral irritation possibly due to candy consumption. -Advise patient to avoid candy and rinse mouth with salt water for relief.  Diabetes Patient has a history of elevated A1C, but it is currently controlled. -Recommend A1C check in the near future to monitor blood sugar control.     Past Medical History:  Diagnosis Date   Eczema    HLD (hyperlipidemia)    HTN (hypertension)    Type 2 diabetes mellitus (HCC)    Social History   Socioeconomic History   Marital status: Single    Spouse name: Not on file   Number of children: Not on file   Years of education: Not on file   Highest education level: Not on file  Occupational History   Not on file  Tobacco Use   Smoking status: Every Day    Current packs/day: 0.50    Average packs/day: 0.5 packs/day for 40.0 years (20.0 ttl pk-yrs)  Types: Cigarettes    Start date: 06/22/1983   Smokeless tobacco: Never  Substance and Sexual Activity   Alcohol use: Yes    Alcohol/week: 200.0 standard drinks of alcohol    Types: 80 Cans of beer, 120 Shots of liquor per week    Comment: reports periodically drinking as much as 4x 40 oz malt liquer and 1x fifth of tequila daily as fund permit   Drug use: Not Currently    Types: "Crack" cocaine, Methamphetamines, Marijuana, Fentanyl    Comment:  Consistantly using crack cocaine almost on a daily basis, cannabis occasionally, other drugs as presented to him by other drug users   Sexual activity: Yes    Partners: Female  Other Topics Concern   Not on file  Social History Narrative   ** Merged History Encounter **       ** Merged History Encounter **       Social Drivers of Corporate investment banker Strain: Low Risk  (03/21/2023)   Received from [facility]   Overall Financial Resource Strain (CARDIA)    Difficulty of Paying Living Expenses: Not very hard  Food Insecurity: Food Insecurity Present (06/15/2023)   Hunger Vital Sign    Worried About Running Out of Food in the Last Year: Often true    Ran Out of Food in the Last Year: Sometimes true  Transportation Needs: Unmet Transportation Needs (06/15/2023)   PRAPARE - Administrator, Civil Service (Medical): Yes    Lack of Transportation (Non-Medical): Yes  Physical Activity: Not on file  Stress: Not on file  Social Connections: Not on file  Intimate Partner Violence: Not At Risk (06/15/2023)   Humiliation, Afraid, Rape, and Kick questionnaire    Fear of Current or Ex-Partner: No    Emotionally Abused: No    Physically Abused: No    Sexually Abused: No   Family History  Problem Relation Age of Onset   Diabetes type II Sister    Diabetes type II Paternal Grandmother    Diabetes type II Maternal Aunt    Allergies  Allergen Reactions   Latex Rash and Other (See Comments)    Skin breaks out Pt denies allergy 06/16/23   Shellfish-Derived Products Rash    ROS    Objective:     Ht 5\' 7"  (1.702 m)   Wt 199 lb (90.3 kg)   BMI 31.17 kg/m  BP Readings from Last 3 Encounters:  06/15/23 132/68  05/20/23 (!) 140/74  04/19/23 (!) 153/86   Wt Readings from Last 3 Encounters:  07/07/23 199 lb (90.3 kg)  06/14/23 185 lb 3 oz (84 kg)  05/20/23 185 lb 3.2 oz (84 kg)    Physical Exam     Assessment & Plan:   Problem List Items Addressed This Visit        Cardiovascular and Mediastinum   HTN (hypertension) - Primary     Endocrine   Type 2 diabetes mellitus (HCC)     Other   HLD (hyperlipidemia)   Alcohol abuse   Cocaine use disorder, severe, dependence (HCC)   MDD (major depressive disorder)   Other Visit Diagnoses       Vitamin D deficiency           No follow-ups on file.    Kasandra Knudsen Mayers, PA-C

## 2023-07-07 NOTE — Patient Instructions (Signed)
 VISIT SUMMARY:  During today's visit, we discussed your ongoing recovery at [facility], your high blood pressure, foot pain. We also reviewed your history of hypertension, arthritis, and diabetes.  YOUR PLAN:  -HYPERTENSION: Hypertension means high blood pressure, which can lead to serious health problems if not managed. We recommend that the recovery center monitor your blood pressure daily. We will have a virtual visit to reassess your blood pressure and adjust your medication if necessary before you leave the center.  -PLANTAR FASCIITIS: Plantar fasciitis is a common cause of heel pain, especially noticeable in the morning or after rest. We suggest wearing supportive shoes, stretching your calf muscles, and doing toe exercises. You can also take ibuprofen for pain relief. If the pain does not improve, we may refer you to a foot specialist.   -DIABETES: Diabetes is a condition that affects your blood sugar levels. Although your A1C is currently controlled, we recommend checking your A1C soon to ensure your blood sugar remains well-managed.

## 2023-07-08 ENCOUNTER — Encounter: Payer: Self-pay | Admitting: Physician Assistant

## 2023-07-15 ENCOUNTER — Emergency Department (HOSPITAL_BASED_OUTPATIENT_CLINIC_OR_DEPARTMENT_OTHER)
Admission: EM | Admit: 2023-07-15 | Discharge: 2023-07-15 | Disposition: A | Discharge status: Home / Self Care | Payer: MEDICAID | Attending: Emergency Medicine | Admitting: Emergency Medicine

## 2023-07-15 ENCOUNTER — Encounter (HOSPITAL_BASED_OUTPATIENT_CLINIC_OR_DEPARTMENT_OTHER): Payer: Self-pay | Admitting: Emergency Medicine

## 2023-07-15 ENCOUNTER — Other Ambulatory Visit: Payer: Self-pay

## 2023-07-15 DIAGNOSIS — I10 Essential (primary) hypertension: Secondary | ICD-10-CM | POA: Insufficient documentation

## 2023-07-15 DIAGNOSIS — Z91040 Latex allergy status: Secondary | ICD-10-CM | POA: Diagnosis not present

## 2023-07-15 DIAGNOSIS — Z79899 Other long term (current) drug therapy: Secondary | ICD-10-CM | POA: Insufficient documentation

## 2023-07-15 MED ORDER — AMLODIPINE-OLMESARTAN 10-20 MG PO TABS: 1.0000 | ORAL_TABLET | Freq: Every day | ORAL | 0 refills | Status: DC

## 2023-07-15 NOTE — ED Notes (Signed)
 ED Provider at bedside.

## 2023-07-15 NOTE — Discharge Instructions (Addendum)
 I have sent in a combo medication called amlodipine-olmesartan. STOP taking the amlodipine alone.   Be sure to follow up with your new facility next week for repeat labs to check your kidney function and electrolytes. Be sure to follow up and let your primary doctor or facility staff know how you feel on this medication, especially if you have dizziness, chest pain, or shortness of breath.

## 2023-07-15 NOTE — ED Provider Notes (Signed)
 [facility] Provider Note   CSN: [id number] Arrival date & time: 07/15/23  7829     History  Chief Complaint  Patient presents with   Hypertension    Jeffery Wheeler is a 58 y.o. male.  The history is provided by the patient.  Hypertension This is a chronic problem. The current episode started more than 1 week ago. The problem occurs constantly. The problem has been gradually worsening. Associated symptoms include headaches. Pertinent negatives include no chest pain, no abdominal pain and no shortness of breath. He has tried nothing for the symptoms.  He denies any new motor or sensory changes. He is currently in [facility] for rehabilitation and is going to a new facility in [place] on Friday for further rehabilitation. Of note, he did see his primary doctor on 07/07/2023 for elevated BP and was continued on amlodipine 10 mg daily.      Home Medications Prior to Admission medications   Medication Sig Start Date End Date Taking? Authorizing Provider  amlodipine-olmesartan (AZOR) 10-20 MG tablet Take 1 tablet by mouth daily. 07/15/23  Yes Jaskarn Schweer, Earvin Hansen, MD  atorvastatin (LIPITOR) 20 MG tablet Take 1 tablet (20 mg total) by mouth daily. 07/07/23   Mayers, Cari S, PA-C  hydrOXYzine (ATARAX) 25 MG tablet Take 1 tablet (25 mg total) by mouth 3 (three) times daily as needed for anxiety. 06/22/23   Bobbye Morton, MD  metFORMIN (GLUCOPHAGE) 850 MG tablet Take 1 tablet (850 mg total) by mouth 2 (two) times daily with a meal. 07/07/23   Mayers, Cari S, PA-C  sertraline (ZOLOFT) 50 MG tablet Take 1 tablet (50 mg total) by mouth daily. 06/22/23 06/21/24  Bobbye Morton, MD  traZODone (DESYREL) 100 MG tablet Take 1 tablet (100 mg total) by mouth at bedtime. 06/22/23   Bobbye Morton, MD  vitamin D3 (CHOLECALCIFEROL) 25 MCG tablet Take 1 tablet (1,000 Units total) by mouth daily. 07/07/23   Mayers, Cari S, PA-C      Allergies    Latex and  Shellfish-derived products    Review of Systems   Review of Systems  Respiratory:  Negative for shortness of breath.   Cardiovascular:  Negative for chest pain.  Gastrointestinal:  Negative for abdominal pain.  Neurological:  Positive for headaches.    Physical Exam Updated Vital Signs BP (!) 159/99   Pulse 66   Temp 98.4 F (36.9 C) (Oral)   Resp 14   SpO2 98%  Physical Exam Constitutional:      General: He is not in acute distress.    Appearance: Normal appearance. He is not ill-appearing.  HENT:     Head: Normocephalic and atraumatic.  Eyes:     Extraocular Movements: Extraocular movements intact.  Cardiovascular:     Rate and Rhythm: Normal rate and regular rhythm.     Heart sounds: Normal heart sounds.  Pulmonary:     Effort: Pulmonary effort is normal. No respiratory distress.     Breath sounds: Normal breath sounds.  Abdominal:     General: Bowel sounds are normal. There is no distension.     Palpations: Abdomen is soft.     Tenderness: There is no abdominal tenderness.  Musculoskeletal:     Cervical back: Normal range of motion.  Skin:    General: Skin is warm and dry.  Neurological:     Mental Status: He is alert and oriented to person, place, and time.  Cranial Nerves: Cranial nerves 2-12 are intact.     Sensory: Sensation is intact.     Motor: Motor function is intact.  Psychiatric:        Mood and Affect: Mood normal.        Behavior: Behavior normal.        Thought Content: Thought content normal.     ED Results / Procedures / Treatments   Labs (all labs ordered are listed, but only abnormal results are displayed) Labs Reviewed - No data to display  EKG EKG Interpretation Date/Time:  Tuesday July 15 2023 09:09:04 EDT Ventricular Rate:  61 PR Interval:  141 QRS Duration:  81 QT Interval:  448 QTC Calculation: 452 R Axis:   56  Text Interpretation: Sinus rhythm Borderline ST elevation, anterior leads Baseline wander in lead(s) V1 No  significant change since last tracing Confirmed by Melene Plan [phone]) on 07/15/2023 9:29:57 AM  Radiology No results found.  Procedures Procedures    Medications Ordered in ED Medications - No data to display  ED Course/ Medical Decision Making/ A&P                                 Medical Decision Making 58 year old male with history of HTN who presents from [facility] at the request of staff for BP in 170s/100s. He is reassuringly asymptomatic. Endorses only mild headache and residual weakness from cervical spinal surgery performed in 2018; however, these have not acutely worsened.  Exam notable for stable vitals and normal cardiopulmonary and neurologic function. He recently had labs and an EKG done in February 2025 which were reassuring. Do not feel he needs further assessment of end-organ dysfunction given his stability.  With shared decision making, will send in amlodipine-olmesartan 10-20 mg daily. Instructed to follow up with PCP and rehabilitation facility staff for BP recheck, lab monitoring, and symptom follow up if he has shortness of breath, chest pain, or dizziness.  Risk Prescription drug management.         Final Clinical Impression(s) / ED Diagnoses Final diagnoses:  Uncontrolled hypertension    Rx / DC Orders ED Discharge Orders          Ordered    amlodipine-olmesartan (AZOR) 10-20 MG tablet  Daily        07/15/23 0931           Janeal Holmes, MD PGY-2, [facility]   Evette Georges, MD 07/15/23 0933    Melene Plan, DO 07/15/23 4782

## 2023-07-15 NOTE — ED Triage Notes (Addendum)
 States his BP was high at [facility] and he needs to be eval. Took BP med PTA

## 2023-07-21 ENCOUNTER — Encounter: Payer: Self-pay | Admitting: Physician Assistant

## 2023-07-21 ENCOUNTER — Ambulatory Visit: Payer: MEDICAID | Admitting: Physician Assistant

## 2023-07-21 VITALS — BP 148/75 | HR 80 | Temp 97.4°F | Ht 67.0 in | Wt 203.0 lb

## 2023-07-21 DIAGNOSIS — I10 Essential (primary) hypertension: Secondary | ICD-10-CM

## 2023-07-21 DIAGNOSIS — E785 Hyperlipidemia, unspecified: Secondary | ICD-10-CM

## 2023-07-21 DIAGNOSIS — F1410 Cocaine abuse, uncomplicated: Secondary | ICD-10-CM

## 2023-07-21 DIAGNOSIS — Z7984 Long term (current) use of oral hypoglycemic drugs: Secondary | ICD-10-CM | POA: Diagnosis not present

## 2023-07-21 DIAGNOSIS — F1010 Alcohol abuse, uncomplicated: Secondary | ICD-10-CM

## 2023-07-21 DIAGNOSIS — M545 Low back pain, unspecified: Secondary | ICD-10-CM

## 2023-07-21 DIAGNOSIS — E119 Type 2 diabetes mellitus without complications: Secondary | ICD-10-CM

## 2023-07-21 DIAGNOSIS — E559 Vitamin D deficiency, unspecified: Secondary | ICD-10-CM | POA: Insufficient documentation

## 2023-07-21 DIAGNOSIS — G8929 Other chronic pain: Secondary | ICD-10-CM

## 2023-07-21 LAB — POCT GLYCOSYLATED HEMOGLOBIN (HGB A1C): HbA1c, POC (controlled diabetic range): 7.5 % — AB (ref 0.0–7.0)

## 2023-07-21 MED ORDER — METFORMIN HCL 850 MG PO TABS: 850.0000 mg | ORAL_TABLET | Freq: Two times a day (BID) | ORAL | 1 refills | Status: AC

## 2023-07-21 MED ORDER — AMLODIPINE-OLMESARTAN 10-20 MG PO TABS: 1.0000 | ORAL_TABLET | Freq: Every day | ORAL | 0 refills | Status: AC

## 2023-07-21 MED ORDER — ATORVASTATIN CALCIUM 20 MG PO TABS: 20.0000 mg | ORAL_TABLET | Freq: Every day | ORAL | 1 refills | Status: AC

## 2023-07-21 MED ORDER — VITAMIN D3 25 MCG PO TABS
1000.0000 [IU] | ORAL_TABLET | Freq: Every day | ORAL | 1 refills | Status: AC
Start: 2023-07-21 — End: ?

## 2023-07-21 MED ORDER — DICLOFENAC SODIUM 1 % EX GEL: 2.0000 g | Freq: Four times a day (QID) | CUTANEOUS | 0 refills | Status: AC | PRN

## 2023-07-21 NOTE — Progress Notes (Unsigned)
 Established Patient Office Visit  Subjective   Patient ID: Jeffery Wheeler, male    DOB: 11-10-1965  Age: 58 y.o. MRN: [id number]  Chief Complaint  Patient presents with   Hypertension    States his BP has been high and he needs to change his medicine   Discussed the use of AI scribe software for clinical note transcription with the patient, who gave verbal consent to proceed.  History of Present Illness        The patient, with a history of hypertension, diabetes, and hyperlipidemia, is currently at [facility] and is transitioning to a program in [place] tomorrow. He expresses concern about his elevated blood pressure, despite being on amlodipine. He recalls previously being on two different types of blood pressure medications, but due to changing doctors, his regimen has been altered. He recently had to go to the hospital due to extremely high blood pressure. He also takes Zoloft and trazodone for anxiety and sleep, respectively, and wonders if these medications could be affecting his blood pressure.  In addition to his concerns about blood pressure, the patient also mentions that he has been experiencing erectile dysfunction, which he believes started after beginning Zoloft.   The patient also discusses his sleep patterns, noting that he has not been able to sleep through the night since being in recovery. He takes his sleeping medication about an hour before bedtime, but often finds himself waking up two or three times throughout the night.      Past Medical History:  Diagnosis Date   Eczema    HLD (hyperlipidemia)    HTN (hypertension)    Type 2 diabetes mellitus (HCC)    Social History   Socioeconomic History   Marital status: Single    Spouse name: Not on file   Number of children: Not on file   Years of education: Not on file   Highest education level: Not on file  Occupational History   Not on file  Tobacco Use   Smoking status: Every Day     Current packs/day: 0.50    Average packs/day: 0.5 packs/day for 40.1 years (20.0 ttl pk-yrs)    Types: Cigarettes    Start date: 06/22/1983   Smokeless tobacco: Never  Substance and Sexual Activity   Alcohol use: Yes    Alcohol/week: 200.0 standard drinks of alcohol    Types: 80 Cans of beer, 120 Shots of liquor per week    Comment: reports periodically drinking as much as 4x 40 oz malt liquer and 1x fifth of tequila daily as fund permit   Drug use: Not Currently    Types: "Crack" cocaine, Methamphetamines, Marijuana, Fentanyl    Comment: Consistantly using crack cocaine almost on a daily basis, cannabis occasionally, other drugs as presented to him by other drug users   Sexual activity: Yes    Partners: Female  Other Topics Concern   Not on file  Social History Narrative   ** Merged History Encounter **       ** Merged History Encounter **       Social Drivers of Corporate investment banker Strain: Low Risk  (03/21/2023)   Received from [facility]   Overall Financial Resource Strain (CARDIA)    Difficulty of Paying Living Expenses: Not very hard  Food Insecurity: Food Insecurity Present (06/15/2023)   Hunger Vital Sign    Worried About Running Out of Food in the Last Year: Often true    Ran Out  of Food in the Last Year: Sometimes true  Transportation Needs: Unmet Transportation Needs (06/15/2023)   PRAPARE - Administrator, Civil Service (Medical): Yes    Lack of Transportation (Non-Medical): Yes  Physical Activity: Not on file  Stress: Not on file  Social Connections: Not on file  Intimate Partner Violence: Not At Risk (06/15/2023)   Humiliation, Afraid, Rape, and Kick questionnaire    Fear of Current or Ex-Partner: No    Emotionally Abused: No    Physically Abused: No    Sexually Abused: No   Family History  Problem Relation Age of Onset   Diabetes type II Sister    Diabetes type II Paternal Grandmother    Diabetes type II Maternal Aunt    Allergies   Allergen Reactions   Latex Rash and Other (See Comments)    Skin breaks out Pt denies allergy 06/16/23   Shellfish-Derived Products Rash    Review of Systems  Constitutional: Negative.   HENT: Negative.    Eyes: Negative.   Respiratory:  Negative for shortness of breath.   Cardiovascular:  Negative for chest pain.  Gastrointestinal: Negative.   Genitourinary: Negative.   Musculoskeletal: Negative.   Skin: Negative.   Neurological: Negative.   Endo/Heme/Allergies: Negative.   Psychiatric/Behavioral: Negative.        Objective:     BP (!) 148/75 (BP Location: Left Arm)   Pulse 80   Temp (!) 97.4 F (36.3 C)   Ht 5\' 7"  (1.702 m)   Wt 203 lb (92.1 kg)   SpO2 97%   BMI 31.79 kg/m  BP Readings from Last 3 Encounters:  07/21/23 (!) 148/75  07/15/23 (!) 151/89  07/07/23 (!) 144/87   Wt Readings from Last 3 Encounters:  07/21/23 203 lb (92.1 kg)  07/07/23 199 lb (90.3 kg)  06/14/23 185 lb 3 oz (84 kg)    Physical Exam Vitals and nursing note reviewed.  Constitutional:      Appearance: Normal appearance.  HENT:     Head: Normocephalic and atraumatic.     Right Ear: External ear normal.     Left Ear: External ear normal.     Nose: Nose normal.     Mouth/Throat:     Mouth: Mucous membranes are moist.     Pharynx: Oropharynx is clear.  Eyes:     Extraocular Movements: Extraocular movements intact.     Conjunctiva/sclera: Conjunctivae normal.     Pupils: Pupils are equal, round, and reactive to light.  Cardiovascular:     Rate and Rhythm: Normal rate and regular rhythm.     Pulses: Normal pulses.     Heart sounds: Normal heart sounds.  Pulmonary:     Effort: Pulmonary effort is normal.     Breath sounds: Normal breath sounds.  Musculoskeletal:        General: Normal range of motion.     Cervical back: Normal range of motion and neck supple.  Skin:    General: Skin is warm and dry.  Neurological:     General: No focal deficit present.     Mental Status:  He is alert and oriented to person, place, and time.  Psychiatric:        Mood and Affect: Mood normal.        Behavior: Behavior normal.        Thought Content: Thought content normal.        Judgment: Judgment normal.  Assessment & Plan:   Problem List Items Addressed This Visit       Cardiovascular and Mediastinum   HTN (hypertension) - Primary   Relevant Medications   amlodipine-olmesartan (AZOR) 10-20 MG tablet   atorvastatin (LIPITOR) 20 MG tablet     Endocrine   Type 2 diabetes mellitus (HCC)   Relevant Medications   amlodipine-olmesartan (AZOR) 10-20 MG tablet   atorvastatin (LIPITOR) 20 MG tablet   metFORMIN (GLUCOPHAGE) 850 MG tablet   Other Relevant Orders   HgB A1c (Completed)   Microalbumin / creatinine urine ratio     Other   HLD (hyperlipidemia)   Relevant Medications   amlodipine-olmesartan (AZOR) 10-20 MG tablet   atorvastatin (LIPITOR) 20 MG tablet   Back pain   Relevant Medications   diclofenac Sodium (VOLTAREN ARTHRITIS PAIN) 1 % GEL   Alcohol abuse   Cocaine use disorder (HCC)   Vitamin D deficiency   Relevant Medications   vitamin D3 (CHOLECALCIFEROL) 25 MCG tablet   Other Visit Diagnoses       Elevated blood pressure reading in office with diagnosis of hypertension       Relevant Medications   amlodipine-olmesartan (AZOR) 10-20 MG tablet   atorvastatin (LIPITOR) 20 MG tablet      1. Primary hypertension (Primary) Patient visited the emergency room on March 11 and blood pressure medication was changed.  He started taking that medication 4 days ago.  Encouraged patient to continue checking blood pressure readings, follow a low-sodium diet and follow-up with primary care provider or medical provider at extended recovery center.  Red flags given for prompt reevaluation - amlodipine-olmesartan (AZOR) 10-20 MG tablet; Take 1 tablet by mouth daily.  Dispense: 30 tablet; Refill: 0  2. Elevated blood pressure reading in office with  diagnosis of hypertension   3. Type 2 diabetes mellitus without complication, without long-term current use of insulin (HCC) A1c 7.5.  Patient encouraged to follow a low sugar diet. - metFORMIN (GLUCOPHAGE) 850 MG tablet; Take 1 tablet (850 mg total) by mouth 2 (two) times daily with a meal.  Dispense: 60 tablet; Refill: 1 - HgB A1c - Microalbumin / creatinine urine ratio  4. Hyperlipidemia, unspecified hyperlipidemia type *** - atorvastatin (LIPITOR) 20 MG tablet; Take 1 tablet (20 mg total) by mouth daily.  Dispense: 30 tablet; Refill: 1  5. Vitamin D deficiency *** - vitamin D3 (CHOLECALCIFEROL) 25 MCG tablet; Take 1 tablet (1,000 Units total) by mouth daily.  Dispense: 30 tablet; Refill: 1  6. Chronic midline low back pain without sciatica *** - diclofenac Sodium (VOLTAREN ARTHRITIS PAIN) 1 % GEL; Apply 2 g topically 4 (four) times daily as needed.  Dispense: 100 g; Refill: 0  7. Cocaine use disorder (HCC) ***  8. Alcohol abuse ***  Assessment and Plan Hypertension  Diabetes Mellitus Managed with metformin. Microalbumin and A1c tests needed to assess kidney function and glucose control. - Order microalbumin test. - Order A1c test.  Erectile Dysfunction Potentially related to sertraline use. Discussed adding bupropion to counteract side effect. Behavioral health consultation required for medication adjustment. - Discuss with behavioral health provider about adding bupropion to counteract erectile dysfunction side effect of sertraline.  [phone] Kathy Breach Morris for result  Return if symptoms worsen or fail to improve.    Kasandra Knudsen Mayers, PA-C

## 2023-07-21 NOTE — Patient Instructions (Signed)
 VISIT SUMMARY:  During today's visit, we discussed your concerns about elevated blood pressure,  erectile dysfunction, and sleep disturbances. We reviewed your current medications and made some adjustments to better manage your conditions. We also planned for some necessary tests and follow-up consultations.  YOUR PLAN:  -HYPERTENSION: Hypertension, or high blood pressure, is when the force of the blood against your artery walls is too high. We have added a new medication called sartan to your regimen along with amlodipine. You should monitor your blood pressure regularly, including in the morning and at night. Additionally, you will be transitioning to a low sodium diet at your new program. Please follow up with your healthcare provider to adjust medications if necessary.  -DIABETES MELLITUS: Diabetes Mellitus is a condition where your blood sugar levels are too high. We will continue managing it with metformin. We have ordered a microalbumin test to check your kidney function and an A1c test to assess your glucose control.

## 2023-07-22 LAB — MICROALBUMIN / CREATININE URINE RATIO
Creatinine, Urine: 216.2 mg/dL
Microalb/Creat Ratio: 9 mg/g{creat} (ref 0–29)
Microalbumin, Urine: 19.6 ug/mL
